# Patient Record
Sex: Male | Born: 1950 | Race: White | Hispanic: No | Smoking: Never smoker
Health system: Southern US, Community
[De-identification: ages and names within clinical notes are randomized; demographics above are authoritative.]

## PROBLEM LIST (undated history)

## (undated) DIAGNOSIS — I5189 Other ill-defined heart diseases: Secondary | ICD-10-CM

## (undated) DIAGNOSIS — D242 Benign neoplasm of left breast: Secondary | ICD-10-CM

## (undated) DIAGNOSIS — R55 Syncope and collapse: Secondary | ICD-10-CM

## (undated) DIAGNOSIS — E119 Type 2 diabetes mellitus without complications: Secondary | ICD-10-CM

## (undated) DIAGNOSIS — I700 Atherosclerosis of aorta: Secondary | ICD-10-CM

## (undated) DIAGNOSIS — I2510 Atherosclerotic heart disease of native coronary artery without angina pectoris: Secondary | ICD-10-CM

## (undated) DIAGNOSIS — Z7982 Long term (current) use of aspirin: Secondary | ICD-10-CM

## (undated) DIAGNOSIS — I209 Angina pectoris, unspecified: Secondary | ICD-10-CM

## (undated) DIAGNOSIS — F419 Anxiety disorder, unspecified: Secondary | ICD-10-CM

## (undated) DIAGNOSIS — M8580 Other specified disorders of bone density and structure, unspecified site: Secondary | ICD-10-CM

## (undated) DIAGNOSIS — A630 Anogenital (venereal) warts: Secondary | ICD-10-CM

## (undated) DIAGNOSIS — H269 Unspecified cataract: Secondary | ICD-10-CM

## (undated) DIAGNOSIS — I10 Essential (primary) hypertension: Secondary | ICD-10-CM

## (undated) DIAGNOSIS — E785 Hyperlipidemia, unspecified: Secondary | ICD-10-CM

## (undated) DIAGNOSIS — Z974 Presence of external hearing-aid: Secondary | ICD-10-CM

## (undated) DIAGNOSIS — Z889 Allergy status to unspecified drugs, medicaments and biological substances status: Secondary | ICD-10-CM

## (undated) DIAGNOSIS — Z973 Presence of spectacles and contact lenses: Secondary | ICD-10-CM

## (undated) DIAGNOSIS — F329 Major depressive disorder, single episode, unspecified: Secondary | ICD-10-CM

## (undated) DIAGNOSIS — T7840XA Allergy, unspecified, initial encounter: Secondary | ICD-10-CM

## (undated) DIAGNOSIS — Z86010 Personal history of colonic polyps: Secondary | ICD-10-CM

## (undated) DIAGNOSIS — G4733 Obstructive sleep apnea (adult) (pediatric): Secondary | ICD-10-CM

## (undated) DIAGNOSIS — G4730 Sleep apnea, unspecified: Secondary | ICD-10-CM

## (undated) DIAGNOSIS — J302 Other seasonal allergic rhinitis: Secondary | ICD-10-CM

## (undated) DIAGNOSIS — Z9989 Dependence on other enabling machines and devices: Secondary | ICD-10-CM

## (undated) DIAGNOSIS — Z860101 Personal history of adenomatous and serrated colon polyps: Secondary | ICD-10-CM

## (undated) DIAGNOSIS — N529 Male erectile dysfunction, unspecified: Secondary | ICD-10-CM

## (undated) DIAGNOSIS — F32A Depression, unspecified: Secondary | ICD-10-CM

## (undated) DIAGNOSIS — Z9289 Personal history of other medical treatment: Secondary | ICD-10-CM

## (undated) HISTORY — PX: CHOLECYSTECTOMY: SHX55

## (undated) HISTORY — PX: APPENDECTOMY: SHX54

## (undated) HISTORY — DX: Depression, unspecified: F32.A

## (undated) HISTORY — PX: COLONOSCOPY WITH PROPOFOL: SHX5780

## (undated) HISTORY — DX: Male erectile dysfunction, unspecified: N52.9

## (undated) HISTORY — DX: Anxiety disorder, unspecified: F41.9

## (undated) HISTORY — DX: Hyperlipidemia, unspecified: E78.5

## (undated) HISTORY — DX: Essential (primary) hypertension: I10

## (undated) HISTORY — PX: CHOLECYSTECTOMY OPEN: SUR202

## (undated) HISTORY — DX: Allergy, unspecified, initial encounter: T78.40XA

## (undated) HISTORY — PX: OTHER SURGICAL HISTORY: SHX169

## (undated) HISTORY — DX: Allergy status to unspecified drugs, medicaments and biological substances status: Z88.9

## (undated) HISTORY — DX: Major depressive disorder, single episode, unspecified: F32.9

## (undated) HISTORY — DX: Sleep apnea, unspecified: G47.30

## (undated) HISTORY — DX: Type 2 diabetes mellitus without complications: E11.9

## (undated) HISTORY — DX: Unspecified cataract: H26.9

## (undated) HISTORY — DX: Obstructive sleep apnea (adult) (pediatric): G47.33

---

## 1998-08-29 ENCOUNTER — Encounter: Payer: Self-pay | Admitting: Emergency Medicine

## 1998-08-29 ENCOUNTER — Emergency Department (HOSPITAL_COMMUNITY): Admission: EM | Admit: 1998-08-29 | Discharge: 1998-08-29 | Payer: Self-pay | Admitting: Emergency Medicine

## 2002-06-09 ENCOUNTER — Encounter: Payer: Self-pay | Admitting: Internal Medicine

## 2002-06-17 ENCOUNTER — Encounter: Payer: Self-pay | Admitting: Internal Medicine

## 2002-06-29 LAB — HM COLONOSCOPY: HM Colonoscopy: NEGATIVE

## 2006-07-10 ENCOUNTER — Encounter: Payer: Self-pay | Admitting: Internal Medicine

## 2007-04-08 HISTORY — PX: CARDIOVASCULAR STRESS TEST: SHX262

## 2007-04-16 ENCOUNTER — Emergency Department: Payer: Self-pay | Admitting: Emergency Medicine

## 2007-04-16 ENCOUNTER — Other Ambulatory Visit: Payer: Self-pay

## 2007-04-17 ENCOUNTER — Ambulatory Visit: Payer: Self-pay | Admitting: Cardiovascular Disease

## 2007-04-17 DIAGNOSIS — Z9289 Personal history of other medical treatment: Secondary | ICD-10-CM

## 2007-04-17 HISTORY — DX: Personal history of other medical treatment: Z92.89

## 2007-07-06 ENCOUNTER — Ambulatory Visit: Payer: Self-pay | Admitting: Internal Medicine

## 2007-07-06 DIAGNOSIS — F39 Unspecified mood [affective] disorder: Secondary | ICD-10-CM | POA: Insufficient documentation

## 2007-07-06 DIAGNOSIS — N529 Male erectile dysfunction, unspecified: Secondary | ICD-10-CM | POA: Insufficient documentation

## 2007-07-06 DIAGNOSIS — I10 Essential (primary) hypertension: Secondary | ICD-10-CM | POA: Insufficient documentation

## 2007-07-09 LAB — CONVERTED CEMR LAB
Albumin: 3.7 g/dL (ref 3.5–5.2)
BUN: 10 mg/dL (ref 6–23)
CO2: 33 meq/L — ABNORMAL HIGH (ref 19–32)
Calcium: 8.8 mg/dL (ref 8.4–10.5)
Chloride: 103 meq/L (ref 96–112)
Creatinine, Ser: 0.9 mg/dL (ref 0.4–1.5)
GFR calc Af Amer: 113 mL/min
GFR calc non Af Amer: 93 mL/min
Glucose, Bld: 86 mg/dL (ref 70–99)
Phosphorus: 3.5 mg/dL (ref 2.3–4.6)
Potassium: 4.1 meq/L (ref 3.5–5.1)
Sodium: 140 meq/L (ref 135–145)

## 2007-08-31 ENCOUNTER — Telehealth (INDEPENDENT_AMBULATORY_CARE_PROVIDER_SITE_OTHER): Payer: Self-pay | Admitting: *Deleted

## 2007-10-26 ENCOUNTER — Ambulatory Visit: Payer: Self-pay | Admitting: Internal Medicine

## 2007-12-04 ENCOUNTER — Ambulatory Visit: Payer: Self-pay | Admitting: Internal Medicine

## 2008-02-04 ENCOUNTER — Telehealth (INDEPENDENT_AMBULATORY_CARE_PROVIDER_SITE_OTHER): Payer: Self-pay | Admitting: *Deleted

## 2008-04-02 ENCOUNTER — Telehealth: Payer: Self-pay | Admitting: Internal Medicine

## 2008-06-03 ENCOUNTER — Telehealth: Payer: Self-pay | Admitting: Family Medicine

## 2008-06-03 ENCOUNTER — Ambulatory Visit: Payer: Self-pay | Admitting: Internal Medicine

## 2008-06-04 ENCOUNTER — Encounter: Payer: Self-pay | Admitting: Internal Medicine

## 2008-06-04 LAB — CONVERTED CEMR LAB
ALT: 47 units/L (ref 0–53)
AST: 26 units/L (ref 0–37)
Albumin: 3.7 g/dL (ref 3.5–5.2)
Alkaline Phosphatase: 63 units/L (ref 39–117)
BUN: 9 mg/dL (ref 6–23)
Basophils Absolute: 0 10*3/uL (ref 0.0–0.1)
Basophils Relative: 0.4 % (ref 0.0–3.0)
Bilirubin, Direct: 0.2 mg/dL (ref 0.0–0.3)
CO2: 31 meq/L (ref 19–32)
Calcium: 8.8 mg/dL (ref 8.4–10.5)
Chloride: 107 meq/L (ref 96–112)
Creatinine, Ser: 0.9 mg/dL (ref 0.4–1.5)
Eosinophils Absolute: 0.1 10*3/uL (ref 0.0–0.7)
Eosinophils Relative: 1.3 % (ref 0.0–5.0)
GFR calc non Af Amer: 92.76 mL/min (ref >60)
Glucose, Bld: 110 mg/dL — ABNORMAL HIGH (ref 70–99)
HCT: 42 % (ref 39.0–52.0)
Hemoglobin: 14.9 g/dL (ref 13.0–17.0)
Lymphocytes Relative: 22 % (ref 12.0–46.0)
Lymphs Abs: 1.2 10*3/uL (ref 0.7–4.0)
MCHC: 35.5 g/dL (ref 30.0–36.0)
MCV: 90.6 fL (ref 78.0–100.0)
Monocytes Absolute: 0.5 10*3/uL (ref 0.1–1.0)
Monocytes Relative: 9 % (ref 3.0–12.0)
Neutro Abs: 3.8 10*3/uL (ref 1.4–7.7)
Neutrophils Relative %: 67.3 % (ref 43.0–77.0)
PSA: 0.65 ng/mL (ref 0.10–4.00)
Platelets: 161 10*3/uL (ref 150.0–400.0)
Potassium: 4.2 meq/L (ref 3.5–5.1)
RBC: 4.63 M/uL (ref 4.22–5.81)
RDW: 12.9 % (ref 11.5–14.6)
Sodium: 143 meq/L (ref 135–145)
TSH: 0.99 microintl units/mL (ref 0.35–5.50)
Total Bilirubin: 1.1 mg/dL (ref 0.3–1.2)
Total Protein: 6.7 g/dL (ref 6.0–8.3)
WBC: 5.6 10*3/uL (ref 4.5–10.5)

## 2008-06-05 LAB — CONVERTED CEMR LAB
HCV Ab: NEGATIVE
Hep A IgM: NEGATIVE
Hep B C IgM: NEGATIVE
Hepatitis B Surface Ag: NEGATIVE

## 2008-08-05 ENCOUNTER — Telehealth: Payer: Self-pay | Admitting: Internal Medicine

## 2008-10-30 ENCOUNTER — Telehealth: Payer: Self-pay | Admitting: Internal Medicine

## 2008-11-03 ENCOUNTER — Telehealth: Payer: Self-pay | Admitting: Internal Medicine

## 2008-12-05 ENCOUNTER — Ambulatory Visit: Payer: Self-pay | Admitting: Internal Medicine

## 2008-12-30 ENCOUNTER — Telehealth (INDEPENDENT_AMBULATORY_CARE_PROVIDER_SITE_OTHER): Payer: Self-pay | Admitting: *Deleted

## 2009-03-03 ENCOUNTER — Telehealth: Payer: Self-pay | Admitting: Internal Medicine

## 2009-05-04 ENCOUNTER — Telehealth: Payer: Self-pay | Admitting: Internal Medicine

## 2009-06-15 ENCOUNTER — Ambulatory Visit: Payer: Self-pay | Admitting: Family Medicine

## 2009-06-15 DIAGNOSIS — J018 Other acute sinusitis: Secondary | ICD-10-CM | POA: Insufficient documentation

## 2009-06-22 ENCOUNTER — Telehealth: Payer: Self-pay | Admitting: Family Medicine

## 2009-07-01 ENCOUNTER — Telehealth: Payer: Self-pay | Admitting: Internal Medicine

## 2009-07-10 ENCOUNTER — Telehealth (INDEPENDENT_AMBULATORY_CARE_PROVIDER_SITE_OTHER): Payer: Self-pay | Admitting: *Deleted

## 2009-09-21 ENCOUNTER — Ambulatory Visit: Payer: Self-pay | Admitting: Internal Medicine

## 2009-09-23 LAB — CONVERTED CEMR LAB
ALT: 47 units/L (ref 0–53)
AST: 30 units/L (ref 0–37)
Albumin: 4.2 g/dL (ref 3.5–5.2)
Alkaline Phosphatase: 53 units/L (ref 39–117)
BUN: 9 mg/dL (ref 6–23)
Basophils Absolute: 0 10*3/uL (ref 0.0–0.1)
Basophils Relative: 0.4 % (ref 0.0–3.0)
Bilirubin, Direct: 0.2 mg/dL (ref 0.0–0.3)
CO2: 33 meq/L — ABNORMAL HIGH (ref 19–32)
Calcium: 8.8 mg/dL (ref 8.4–10.5)
Chloride: 103 meq/L (ref 96–112)
Creatinine, Ser: 0.9 mg/dL (ref 0.4–1.5)
Eosinophils Absolute: 0.1 10*3/uL (ref 0.0–0.7)
Eosinophils Relative: 1 % (ref 0.0–5.0)
GFR calc non Af Amer: 88.9 mL/min (ref >60)
Glucose, Bld: 89 mg/dL (ref 70–99)
HCT: 44.8 % (ref 39.0–52.0)
Hemoglobin: 14.9 g/dL (ref 13.0–17.0)
Lymphocytes Relative: 18.7 % (ref 12.0–46.0)
Lymphs Abs: 1.6 10*3/uL (ref 0.7–4.0)
MCHC: 33.3 g/dL (ref 30.0–36.0)
MCV: 93.1 fL (ref 78.0–100.0)
Monocytes Absolute: 0.7 10*3/uL (ref 0.1–1.0)
Monocytes Relative: 8.3 % (ref 3.0–12.0)
Neutro Abs: 6.1 10*3/uL (ref 1.4–7.7)
Neutrophils Relative %: 71.6 % (ref 43.0–77.0)
PSA: 0.69 ng/mL (ref 0.10–4.00)
Phosphorus: 3.2 mg/dL (ref 2.3–4.6)
Platelets: 202 10*3/uL (ref 150.0–400.0)
Potassium: 3.8 meq/L (ref 3.5–5.1)
RBC: 4.82 M/uL (ref 4.22–5.81)
RDW: 13.8 % (ref 11.5–14.6)
Sodium: 141 meq/L (ref 135–145)
TSH: 0.69 microintl units/mL (ref 0.35–5.50)
Total Bilirubin: 1 mg/dL (ref 0.3–1.2)
Total Protein: 7.2 g/dL (ref 6.0–8.3)
WBC: 8.5 10*3/uL (ref 4.5–10.5)

## 2009-10-02 ENCOUNTER — Telehealth: Payer: Self-pay | Admitting: Family Medicine

## 2009-10-12 ENCOUNTER — Telehealth: Payer: Self-pay | Admitting: Internal Medicine

## 2009-10-14 ENCOUNTER — Telehealth: Payer: Self-pay | Admitting: Internal Medicine

## 2009-10-20 ENCOUNTER — Telehealth: Payer: Self-pay | Admitting: Internal Medicine

## 2009-10-21 ENCOUNTER — Ambulatory Visit: Payer: Self-pay | Admitting: Internal Medicine

## 2009-11-04 ENCOUNTER — Telehealth: Payer: Self-pay | Admitting: Internal Medicine

## 2009-12-17 ENCOUNTER — Ambulatory Visit: Payer: Self-pay | Admitting: Internal Medicine

## 2009-12-21 ENCOUNTER — Telehealth: Payer: Self-pay | Admitting: Internal Medicine

## 2010-01-01 ENCOUNTER — Telehealth: Payer: Self-pay | Admitting: Internal Medicine

## 2010-02-12 ENCOUNTER — Telehealth: Payer: Self-pay | Admitting: Family Medicine

## 2010-04-06 NOTE — Progress Notes (Signed)
Summary: citalopram   Phone Note Call from Patient Call back at Home Phone [phone]   Caller: Patient Call For: Cindee Salt MD Summary of Call: Patient has been taking the citalopram at bedtime instead of in the morning. He says that he still having alot of anxiety in the mornings. He is asking if it is okay to increase it to 2 times a day at this point. Please advise.  Initial call taken by: Melody Comas,  November 04, 2009 9:54 AM  Follow-up for Phone Call        okay to increase to two times a day   had appt today  Please reschedule for about 3 weeks to see how he is doing on the higher dose Follow-up by: Cindee Salt MD,  November 04, 2009 10:27 AM  Additional Follow-up for Phone Call Additional follow up Details #1::        Spoke with patient and advised results. Rx sent to pharmacy, also pt states he will call back to scheduled appt. Additional Follow-up by: Mervin Hack CMA (AAMA),  November 04, 2009 11:04 AM    New/Updated Medications: CITALOPRAM HYDROBROMIDE 20 MG TABS (CITALOPRAM HYDROBROMIDE) take 1 by mouth by mouth two times a day Prescriptions: CITALOPRAM HYDROBROMIDE 20 MG TABS (CITALOPRAM HYDROBROMIDE) take 1 by mouth by mouth two times a day  #60 x 0   Entered by:   Mervin Hack CMA (AAMA)   Authorized by:   Cindee Salt MD   Signed by:   Mervin Hack CMA (AAMA) on 11/04/2009   Method used:   Electronically to        [facility] [phone]* (retail)       [address]       [place], [place]  [address]       Ph: [phone]       Fax: [phone]   RxID:   [id number]

## 2010-04-06 NOTE — Progress Notes (Signed)
Summary: refill request for xanax  Phone Note Refill Request Message from:  Fax from Pharmacy  Refills Requested: Medication #1:  ALPRAZOLAM 0.5 MG TABS 1-2 tabs by mouth three times a day as needed anxiety or insomnia.   Last Refilled: 09/14/2009 [facility] [place] is on your [facility].  Initial call taken by: Lowella Petties CMA,  October 12, 2009 11:11 AM  Follow-up for Phone Call        #60 just done on July 29th Please check with the pharmacy Follow-up by: Cindee Salt MD,  October 12, 2009 5:30 PM  Additional Follow-up for Phone Call Additional follow up Details #1::        Spoke to Toniann Fail at the pharmacy and was informed that they did receive the refill on July 29th and to disregard this request. Additional Follow-up by: Sydell Axon LPN,  October 12, 2009 5:34 PM

## 2010-04-06 NOTE — Progress Notes (Signed)
Summary: refill request for alprazolam  Phone Note Refill Request Message from:  Fax from Pharmacy  Refills Requested: Medication #1:  ALPRAZOLAM 0.5 MG TABS 1 two times a day as needed for nerves   Last Refilled: 06/02/2009 [facility]ed [facility] [place] [facility] your [facility].  Initial call taken by: Lowella Petties CMA,  July 01, 2009 8:45 AM  Follow-up for Phone Call        okay #60 x 1 Follow-up by: Cindee Salt MD,  July 01, 2009 1:44 PM  Additional Follow-up for Phone Call Additional follow up Details #1::        Rx faxed to pharmacy Additional Follow-up by: DeShannon Smith CMA Duncan Dull),  July 01, 2009 2:28 PM    Prescriptions: ALPRAZOLAM 0.5 MG TABS (ALPRAZOLAM) 1 two times a day as needed for nerves  #60 x 1   Entered by:   Mervin Hack CMA (AAMA)   Authorized by:   Cindee Salt MD   Signed by:   Mervin Hack CMA (AAMA) on 07/01/2009   Method used:   Handwritten   RxID:   [id number]

## 2010-04-06 NOTE — Progress Notes (Signed)
Summary: problem with anxiety  Phone Note Call from Patient Call back at Home Phone [phone]   Caller: Patient Summary of Call: Pt states he wakes in the early mornings and feels very anxious.  He then takes a xanax and it takes him till around lunch time to feel  better.  He is asking if there is something that he can take at bedtime that will help with this.  He says he does feel a little better since starting celexa.   I advised  pt that Dr. Alphonsus Sias is on vacation today and all of next week.  [facility] [place]. Initial call taken by: Lowella Petties CMA,  October 02, 2009 3:16 PM  Follow-up for Phone Call        [facility] to take another dose of Xanax at night.  I would not add another antidepressant if he recently started Celexa.  Needs to see Dr. Alphonsus Sias when he returns or one of [place] if he needs to be seen sooner. Ruthe Mannan MD  October 02, 2009 3:21 PM  Patient advised as instructed via telephone, Rx called to pharmacy. Follow-up by: Linde Gillis CMA Duncan Dull),  October 02, 2009 3:32 PM    New/Updated Medications: ALPRAZOLAM 0.5 MG TABS (ALPRAZOLAM) 1-2 tabs by mouth three times a day as needed anxiety or insomnia. Prescriptions: ALPRAZOLAM 0.5 MG TABS (ALPRAZOLAM) 1-2 tabs by mouth three times a day as needed anxiety or insomnia.  #60 x 0   Entered and Authorized by:   Ruthe Mannan MD   Signed by:   Ruthe Mannan MD on 10/02/2009   Method used:   Telephoned to ...       [facility] [phone]* (retail)       [address]       [place], [place]  [address]       Ph: [phone]       Fax: [phone]   RxID:   [id number]

## 2010-04-06 NOTE — Progress Notes (Signed)
Summary: Rx Alprazolam  Phone Note Refill Request Call back at [phone] Message [facility]:  [facility] on May 04, 2009 9:51 AM  Refills Requested: Medication #1:  ALPRAZOLAM 0.5 MG TABS 1 two times a day as needed for nerves   Last Refilled: 04/03/2009 Received faxed refill request, form in your IN box   Method Requested: Fax to Local Pharmacy Initial call taken by: Linde Gillis CMA Duncan Dull),  May 04, 2009 9:52 AM  Follow-up for Phone Call        okay #60 x 1 Follow-up by: Cindee Salt MD,  May 04, 2009 11:52 AM  Additional Follow-up for Phone Call Additional follow up Details #1::        Rx faxed to pharmacy Additional Follow-up by: DeShannon Smith CMA Duncan Dull),  May 04, 2009 2:37 PM    Prescriptions: ALPRAZOLAM 0.5 MG TABS (ALPRAZOLAM) 1 two times a day as needed for nerves  #60 x 1   Entered by:   Mervin Hack CMA (AAMA)   Authorized by:   Cindee Salt MD   Signed by:   Mervin Hack CMA (AAMA) on 05/04/2009   Method used:   Handwritten   RxID:   [id number]

## 2010-04-06 NOTE — Progress Notes (Signed)
Summary: coreg  Phone Note Refill Request Call back at Home Phone [phone] Message from:  Patient on Jul 10, 2009 4:18 PM  Refills Requested: Medication #1:  COREG CR 20 MG  CP24 Take 1 tablet by mouth once a day Patient says that he has contacted pharmacy numerous times and we still have not received request. [facility] [address]   Initial call taken by: Melody Comas,  Jul 10, 2009 4:27 PM  Follow-up for Phone Call        Patient notified [facility] [address]  Follow-up by: Melody Comas,  Jul 10, 2009 4:34 PM    Prescriptions: COREG CR 20 MG  CP24 (CARVEDILOL PHOSPHATE) Take 1 tablet by mouth once a day  #30 Capsule x 2   Entered by:   Linde Gillis CMA (AAMA)   Authorized by:   Cindee Salt MD   Signed by:   Linde Gillis CMA (AAMA) on 07/10/2009   Method used:   Electronically to        [facility] [phone]* (retail)       [address]       [place], [place]  [address]       Ph: [phone]       Fax: [phone]   RxID:   [id number]

## 2010-04-06 NOTE — Assessment & Plan Note (Signed)
Summary: 2:15 F/U ANXIETY/CLE   Vital Signs:  Patient profile:   60 year old male Weight:      238 pounds Temp:     98.3 degrees F oral Pulse rate:   60 / minute Pulse rhythm:   regular BP sitting:   122 / 80  (left arm) Cuff size:   large  Vitals Entered By: Mervin Hack CMA Duncan Dull) (December 17, 2009 2:06 PM) CC: anxiety   History of Present Illness: Was having more anxiety Went up to 20mg  two times a day about 5 weeks ago Feels that he is doing better but still awakens feeling anxious---lasts about 1 hour unitl his AM meds kick Takes the buspirone regularly two times a day-- ~8AM and 10 PM uses alprazolam  every night sleeping well now  no depression  Allergies: 1)  ! Penicillin  Past History:  Past medical, surgical, family and social histories (including risk factors) reviewed for relevance to current acute and chronic problems.  Past Medical History: Reviewed history from 07/06/2007 and no changes required. Hypertension Erectile dysfunction Anxiety  Past Surgical History: Reviewed history from 07/06/2007 and no changes required. Appendectomy-with chole Cholecystectomy--done by Dr Okey Dupre in [phone] Treadmill stress test negative 2/09  Family History: Reviewed history from 07/06/2007 and no changes required. Dad died @67  of Parkinsons, did have MI Mom with DM, has pacer, arthritis, HTN 1 brother, 2 sisters No DM No colon or prostate cancer  Social History: Reviewed history from 07/06/2007 and no changes required. 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  Hopes to start with [employer]  Review of Systems       appetite is fine weight down 4# Gets a warm sensation after taking buspirone---no sedation though  Physical Exam  General:  alert and normal appearance.   Psych:  normally interactive, good eye contact, not anxious appearing, and not  depressed appearing.     Impression & Recommendations:  Problem # 1:  ANXIETY (ICD-300.00) Assessment Improved overall better but having AM anxiety that suggests a withdrawl problem  will have him stop the alprazolam at night if AM anxiety better, but not sleeping, will add temazepam  His updated medication list for this problem includes:    Alprazolam 0.5 Mg Tabs (Alprazolam) .Marland Kitchen... 1-2 tabs by mouth three times a day as needed for anxiety    Buspirone Hcl 15 Mg Tabs (Buspirone hcl) .Marland Kitchen... 1 tab by mouth two times a day for anxiety    Citalopram Hydrobromide 20 Mg Tabs (Citalopram hydrobromide) .Marland Kitchen... Take 1 by mouth by mouth two times a day  Complete Medication List: 1)  Coreg Cr 20 Mg Cp24 (Carvedilol phosphate) .... Take 1 tablet by mouth once a day 2)  Azor 10-40 Mg Tabs (Amlodipine-olmesartan) .... Take 1 tablet by mouth once a day 3)  Quinapril Hcl 40 Mg Tabs (Quinapril hcl) .... Take 1 tablet by mouth once a day 4)  Cialis 20 Mg Tabs (Tadalafil) .... 1/2 - 1 tab about 1 hour before sex 5)  Alprazolam 0.5 Mg Tabs (Alprazolam) .Marland Kitchen.. 1-2 tabs by mouth three times a day as needed for anxiety 6)  Buspirone Hcl 15 Mg Tabs (Buspirone hcl) .Marland Kitchen.. 1 tab by mouth two times a day for anxiety 7)  Citalopram Hydrobromide 20 Mg Tabs (Citalopram hydrobromide) .... Take 1 by mouth by mouth two times a day 8)  Adult Aspirin Low Strength 81 Mg Tbdp (Aspirin) .... Take one  tablet by mouth every other day  Patient Instructions: 1)  Please stop taking the alprazolam at night but continue the other meds.  2)  Only take the alprazolam for worsening of your anxiety 3)  If you continue to have sleep problems, please call and we will try a different medicaiton 4)  Please schedule a follow-up appointment in 4 months .   Current Allergies (reviewed today): ! PENICILLIN

## 2010-04-06 NOTE — Assessment & Plan Note (Signed)
Summary: 9:45 discuss med for anxiety/alc   Vital Signs:  Patient profile:   60 year old male Weight:      242 pounds Temp:     98.1 degrees F oral Pulse rate:   74 / minute Pulse rhythm:   regular BP sitting:   118 / 60  (left arm) Cuff size:   large  Vitals Entered By: Mervin Hack CMA Duncan Dull) (October 21, 2009 10:05 AM) CC: anxiety   History of Present Illness: Has had more trouble with his anxiety did start taking the buspirone regularly two times a day since the phone call Biggest problem has been early morning awakening like 4AM and can't get back to sleep will try alprazolam but that may affect his AM alertness  This goes back for some time anxiety is okay during the day in general but seems like his meds wear off by early AM Buspar dose is 7AM and 8-9PM takes citalopram in early AM with other meds  Prior to 8/10, he was only taking 7.5mg  two times a day of buspirone  Allergies: 1)  ! Penicillin  Past History:  Past medical, surgical, family and social histories (including risk factors) reviewed for relevance to current acute and chronic problems.  Past Medical History: Reviewed history from 07/06/2007 and no changes required. Hypertension Erectile dysfunction Anxiety  Past Surgical History: Reviewed history from 07/06/2007 and no changes required. Appendectomy-with chole Cholecystectomy--done by Dr Okey Dupre in [phone] Treadmill stress test negative 2/09  Family History: Reviewed history from 07/06/2007 and no changes required. Dad died @67  of Parkinsons, did have MI Mom with DM, has pacer, arthritis, HTN 1 brother, 2 sisters No DM No colon or prostate cancer  Social History: Reviewed history from 07/06/2007 and no changes required. 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  Hopes to start with [employer]  Review of Systems       weight down 11#  since last visit trying to walk more appetite may be down some with increased buspar  Physical Exam  Psych:  normally interactive, good eye contact, not anxious appearing, and not depressed appearing.     Impression & Recommendations:  Problem # 1:  ANXIETY (ICD-300.00) Assessment Deteriorated has early morning anxiety otherwise well controlled will wait to see if increased buspirone helps consider changing citalopram to bedtime. If persists, would increase to 20mg  twice daily 15 minute visit--all counselling  His updated medication list for this problem includes:    Alprazolam 0.5 Mg Tabs (Alprazolam) .Marland Kitchen... 1-2 tabs by mouth three times a day as needed anxiety or insomnia.    Buspirone Hcl 15 Mg Tabs (Buspirone hcl) .Marland Kitchen... 1 tab by mouth two times a day for anxiety    Citalopram Hydrobromide 20 Mg Tabs (Citalopram hydrobromide) .Marland Kitchen... 1 tab in evening  for anxiety and depression  Complete Medication List: 1)  Coreg Cr 20 Mg Cp24 (Carvedilol phosphate) .... Take 1 tablet by mouth once a day 2)  Azor 10-40 Mg Tabs (Amlodipine-olmesartan) .... Take 1 tablet by mouth once a day 3)  Quinapril Hcl 40 Mg Tabs (Quinapril hcl) .... Take 1 tablet by mouth once a day 4)  Cialis 20 Mg Tabs (Tadalafil) .... 1/2 - 1 tab about 1 hour before sex 5)  Alprazolam 0.5 Mg Tabs (Alprazolam) .Marland Kitchen.. 1-2 tabs by mouth three times a day as needed anxiety or insomnia. 6)  Buspirone Hcl 15 Mg Tabs (Buspirone hcl) .Marland KitchenMarland KitchenMarland Kitchen 1  tab by mouth two times a day for anxiety 7)  Citalopram Hydrobromide 20 Mg Tabs (Citalopram hydrobromide) .Marland Kitchen.. 1 tab in evening  for anxiety and depression 8)  Adult Aspirin Low Strength 81 Mg Tbdp (Aspirin) .... Take one tablet by mouth every other day  Patient Instructions: 1)  Please try the citalopram at bedtime instead of the morning. If the anxiety is not better, we will increase it to 20mg  two times a day  2)  Please keep your regular appointment  Current Allergies (reviewed today): !  PENICILLIN

## 2010-04-06 NOTE — Assessment & Plan Note (Signed)
Summary: ?EAR,SINUS INFECTION/CLE   Vital Signs:  Patient profile:   60 year old male Height:      69 inches Weight:      253.25 pounds BMI:     37.53 Temp:     98.9 degrees F oral Pulse rate:   84 / minute Pulse rhythm:   regular BP sitting:   126 / 80  (left arm) Cuff size:   large  Vitals Entered By: Delilah Shan CMA (AAMA) (June 15, 2009 10:00 AM) CC: ? ear / sinus infection   History of Present Illness: 60 yo with ear and sinus pressure.  Started out with a scratchy throat last week, last few days sinus pressure and drainage so severe he could not sleep.  Dry cough, ears popping. No fevers, chills, wheezing, or SOB.  Current Medications (verified): 1)  Coreg Cr 20 Mg  Cp24 (Carvedilol Phosphate) .... Take 1 Tablet By Mouth Once A Day 2)  Azor 10-40 Mg  Tabs (Amlodipine-Olmesartan) .... Take 1 Tablet By Mouth Once A Day 3)  Quinapril Hcl 40 Mg  Tabs (Quinapril Hcl) .... Take 1 Tablet By Mouth Once A Day 4)  Adult Aspirin Low Strength 81 Mg  Tbdp (Aspirin) .... Take One Tablet By Mouth Every Other Day 5)  Cialis 20 Mg  Tabs (Tadalafil) .... 1/2 - 1 Tab About 1 Hour Before Sex 6)  Alprazolam 0.5 Mg Tabs (Alprazolam) .Marland Kitchen.. 1 Two Times A Day As Needed For Nerves 7)  Buspirone Hcl 15 Mg Tabs (Buspirone Hcl) .Marland Kitchen.. 1 Tab By Mouth Two Times A Day For Anxiety 8)  Azithromycin 250 Mg  Tabs (Azithromycin) .... 2 By  Mouth Today and Then 1 Daily For 4 Days  Allergies: 1)  ! Penicillin  Review of Systems      See HPI General:  Denies chills and fever. ENT:  Complains of nasal congestion, postnasal drainage, sinus pressure, and sore throat. CV:  Denies shortness of breath with exertion. Resp:  Complains of cough; denies shortness of breath, sputum productive, and wheezing.  Physical Exam  General:  alert and normal appearance.   Non toxic, afebrile and normotensive Ears:  TMs retracted bialterally Nose:  nasal dischargemucosal pallor.   maxillary sinuses TTP Mouth:  no  erythema and no lesions.   Lungs:  normal respiratory effort and normal breath sounds.   Heart:  normal rate, regular rhythm, no murmur, and no gallop.   Extremities:  no edema Cervical Nodes:  No lymphadenopathy noted Psych:  normally interactive, good eye contact, not anxious appearing, and not depressed appearing.     Impression & Recommendations:  Problem # 1:  OTHER ACUTE SINUSITIS (ICD-461.8) Assessment New Given duration and progression of symptoms, will treat for bacterial sinusitis with Zpack (has PCN allergy). Supportive care as per pt instructions. His updated medication list for this problem includes:    Azithromycin 250 Mg Tabs (Azithromycin) .Marland Kitchen... 2 by  mouth today and then 1 daily for 4 days  Complete Medication List: 1)  Coreg Cr 20 Mg Cp24 (Carvedilol phosphate) .... Take 1 tablet by mouth once a day 2)  Azor 10-40 Mg Tabs (Amlodipine-olmesartan) .... Take 1 tablet by mouth once a day 3)  Quinapril Hcl 40 Mg Tabs (Quinapril hcl) .... Take 1 tablet by mouth once a day 4)  Adult Aspirin Low Strength 81 Mg Tbdp (Aspirin) .... Take one tablet by mouth every other day 5)  Cialis 20 Mg Tabs (Tadalafil) .... 1/2 - 1 tab about 1 hour  before sex 6)  Alprazolam 0.5 Mg Tabs (Alprazolam) .Marland Kitchen.. 1 two times a day as needed for nerves 7)  Buspirone Hcl 15 Mg Tabs (Buspirone hcl) .Marland Kitchen.. 1 tab by mouth two times a day for anxiety 8)  Azithromycin 250 Mg Tabs (Azithromycin) .... 2 by  mouth today and then 1 daily for 4 days  Patient Instructions: 1)  Take Zpack as directed.  Drink lots of fluids.  Treat sympotmatically with Mucinex, nasal saline irrigation, and Tylenol/Ibuprofen.  You can use warm compresses.  Cough suppressant at night. Call if not improving as expected in 5-7 days.  Prescriptions: AZITHROMYCIN 250 MG  TABS (AZITHROMYCIN) 2 by  mouth today and then 1 daily for 4 days  #6 x 0   Entered and Authorized by:   Ruthe Mannan MD   Signed by:   Ruthe Mannan MD on 06/15/2009   Method  used:   Electronically to        [facility] [phone][facility] (retail)       [address]       [place], [place]  [address]       Ph: [phone]       Fax: [phone]   RxID:   [id number]   Current Allergies (reviewed today): ! PENICILLIN

## 2010-04-06 NOTE — Progress Notes (Signed)
Summary: refill request for temazepam  Phone Note Refill Request Message from:  Fax from Pharmacy  Refills Requested: Medication #1:  TEMAZEPAM 15 MG CAPS 1-2 at bedtime as needed to help sleep.   Last Refilled: 01/01/2010 [facility], [phone].  Initial call taken by: Lowella Petties CMA, AAMA,  February 12, 2010 4:13 PM  Follow-up for Phone Call        filled.  please call in and notify patient. Follow-up by: Eustaquio Boyden  MD,  February 12, 2010 4:47 PM  Additional Follow-up for Phone Call Additional follow up Details #1::        [facility] Additional Follow-up by: Lowella Petties CMA, AAMA,  February 12, 2010 5:02 PM    Prescriptions: TEMAZEPAM 15 MG CAPS (TEMAZEPAM) 1-2 at bedtime as needed to help sleep  #60 x 0   Entered and Authorized by:   Eustaquio Boyden  MD   Signed by:   Lowella Petties CMA, AAMA on 02/12/2010   Method used:   Telephoned to ...       [facility] [phone]* (retail)       [address]       [place], [place]  [address]       Ph: [phone]       Fax: [phone]   RxID:   [id number]

## 2010-04-06 NOTE — Progress Notes (Signed)
Summary: refill request for alprazolam  Phone Note Refill Request Message from:  Fax from Pharmacy  Refills Requested: Medication #1:  ALPRAZOLAM 0.5 MG TABS 1-2 tabs by mouth three times a day as needed anxiety or insomnia.   Last Refilled: 09/14/2009 [facility]ed [facility] [place], [phone].  Initial call taken by: Lowella Petties CMA,  October 20, 2009 11:43 AM  Follow-up for Phone Call        okay #60 x 0 Follow-up by: Cindee Salt MD,  October 20, 2009 1:26 PM  Additional Follow-up for Phone Call Additional follow up Details #1::        Rx called to pharmacy Additional Follow-up by: DeShannon Katrinka Blazing CMA Duncan Dull),  October 20, 2009 2:22 PM    Prescriptions: ALPRAZOLAM 0.5 MG TABS (ALPRAZOLAM) 1-2 tabs by mouth three times a day as needed anxiety or insomnia.  #60 x 0   Entered by:   Mervin Hack CMA (AAMA)   Authorized by:   Cindee Salt MD   Signed by:   Mervin Hack CMA (AAMA) on 10/20/2009   Method used:   Telephoned to ...       [facility] [phone]* (retail)       [address]       [place], [place]  [address]       Ph: [phone]       Fax: [phone]   RxID:   [id number]

## 2010-04-06 NOTE — Assessment & Plan Note (Signed)
Summary: CPX/DLO   Vital Signs:  Patient profile:   60 year old male Height:      68 inches Weight:      253 pounds Temp:     98.4 degrees F oral Pulse rate:   80 / minute Pulse rhythm:   regular BP sitting:   132 / 82  (left arm) Cuff size:   large  Vitals Entered By: Lowella Petties CMA (September 21, 2009 3:09 PM) CC: CPX   History of Present Illness: Fighting bad depression Business has been okay but being sued by former Airline pilot who they had to fire Dealing with this stress Going to court later this week Tax audit also ongoing Constant anxiety and stress mode gets jittery, can't sleep ---occ wakes in sweat and panic symptoms Has been on buspirone and alprazolam Stress and depression are situational but quite significant  No other medical concerns  Allergies: 1)  ! Penicillin  Past History:  Past medical, surgical, family and social histories (including risk factors) reviewed for relevance to current acute and chronic problems.  Past Medical History: Reviewed history from 07/06/2007 and no changes required. Hypertension Erectile dysfunction Anxiety  Past Surgical History: Reviewed history from 07/06/2007 and no changes required. Appendectomy-with chole Cholecystectomy--done by Dr Okey Dupre in [phone] Treadmill stress test negative 2/09  Family History: Reviewed history from 07/06/2007 and no changes required. Dad died @67  of Parkinsons, did have MI Mom with DM, has pacer, arthritis, HTN 1 brother, 2 sisters No DM No colon or prostate cancer  Social History: Reviewed history from 07/06/2007 and no changes required. 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  Hopes to start with [employer]  Review of Systems General:  has gained his 10# back wears seat belt. Eyes:  Denies double vision and vision loss-1 eye. ENT:  Complains of decreased hearing; denies  ringing in ears; wears hearing aides recent dental work--extraction and implant. CV:  Complains of swelling of feet; denies chest pain or discomfort, difficulty breathing at night, difficulty breathing while lying down, fainting, lightheadness, palpitations, and shortness of breath with exertion; mild ankle edema in evenings. Resp:  Denies cough and shortness of breath. GI:  Denies abdominal pain, bloody stools, change in bowel habits, dark tarry stools, indigestion, nausea, and vomiting. GU:  Complains of erectile dysfunction; denies urinary frequency and urinary hesitancy. MS:  Denies joint pain and joint swelling. Derm:  Denies lesion(s) and rash. Neuro:  Complains of numbness and tingling; denies headaches and weakness; some left arm numbness and tingling--related to stress. Psych:  Complains of anxiety and depression. Heme:  Denies abnormal bruising and enlarge lymph nodes. Allergy:  did have allergic reaction after spreading pine needles--resolved with a few days of predniosne.  Physical Exam  General:  alert and normal appearance.   Eyes:  pupils equal, pupils round, pupils reactive to light, and no optic disk abnormalities.   Ears:  R ear normal and L ear normal.   Mouth:  no erythema, no exudates, and no lesions.   Neck:  supple, no masses, no thyromegaly, no carotid bruits, and no cervical lymphadenopathy.   Lungs:  normal respiratory effort and normal breath sounds.   Heart:  normal rate, regular rhythm, no murmur, and no gallop.   Abdomen:  soft, non-tender, and no masses.   Rectal:  no external abnormalities, no hemorrhoids, and no masses.   Prostate:  no gland enlargement and no nodules.  Msk:  no joint tenderness and no joint swelling.   Pulses:  1+ in feet Extremities:  no edema Neurologic:  alert & oriented X3, strength normal in all extremities, and gait normal.   Skin:  no rashes and no suspicious lesions.   Axillary Nodes:  No palpable lymphadenopathy Psych:   normally interactive, good eye contact, dysphoric affect, and slightly anxious.     Impression & Recommendations:  Problem # 1:  PREVENTIVE HEALTH CARE (ICD-V70.0) Assessment Comment Only colon up to date due for PSA discussed fitness  Problem # 2:  ANXIETY (ICD-300.00) Assessment: Deteriorated lots of stress now with secondary depression DIscussed at length will add citalopram discussed potential side effects including irritablility and suicidal ideation  His updated medication list for this problem includes:    Alprazolam 0.5 Mg Tabs (Alprazolam) .Marland Kitchen... 1 two times a day as needed for nerves    Buspirone Hcl 15 Mg Tabs (Buspirone hcl) .Marland Kitchen... 1 tab by mouth two times a day for anxiety    Citalopram Hydrobromide 20 Mg Tabs (Citalopram hydrobromide) .Marland Kitchen... 1 tab daily for anxiety and depression  Problem # 3:  HYPERTENSION (ICD-401.9) Assessment: Unchanged  reasonable control  no changes needed  His updated medication list for this problem includes:    Coreg Cr 20 Mg Cp24 (Carvedilol phosphate) .Marland Kitchen... Take 1 tablet by mouth once a day    Azor 10-40 Mg Tabs (Amlodipine-olmesartan) .Marland Kitchen... Take 1 tablet by mouth once a day    Quinapril Hcl 40 Mg Tabs (Quinapril hcl) .Marland Kitchen... Take 1 tablet by mouth once a day  BP today: 132/82 Prior BP: 126/80 (06/15/2009)  Labs Reviewed: K+: 4.2 (06/03/2008) Creat: : 0.9 (06/03/2008)     Orders: TLB-Renal Function Panel (80069-RENAL) TLB-CBC Platelet - w/Differential (85025-CBCD) TLB-Hepatic/Liver Function Pnl (80076-HEPATIC) TLB-TSH (Thyroid Stimulating Hormone) (84443-TSH) Venipuncture (16109)  Complete Medication List: 1)  Coreg Cr 20 Mg Cp24 (Carvedilol phosphate) .... Take 1 tablet by mouth once a day 2)  Azor 10-40 Mg Tabs (Amlodipine-olmesartan) .... Take 1 tablet by mouth once a day 3)  Quinapril Hcl 40 Mg Tabs (Quinapril hcl) .... Take 1 tablet by mouth once a day 4)  Adult Aspirin Low Strength 81 Mg Tbdp (Aspirin) .... Take one  tablet by mouth every other day 5)  Cialis 20 Mg Tabs (Tadalafil) .... 1/2 - 1 tab about 1 hour before sex 6)  Alprazolam 0.5 Mg Tabs (Alprazolam) .Marland Kitchen.. 1 two times a day as needed for nerves 7)  Buspirone Hcl 15 Mg Tabs (Buspirone hcl) .Marland Kitchen.. 1 tab by mouth two times a day for anxiety 8)  Citalopram Hydrobromide 20 Mg Tabs (Citalopram hydrobromide) .Marland Kitchen.. 1 tab daily for anxiety and depression  Other Orders: TLB-PSA (Prostate Specific Antigen) (84153-PSA)  Patient Instructions: 1)  Please schedule a follow-up appointment in 4-6 weeks.  Prescriptions: CITALOPRAM HYDROBROMIDE 20 MG TABS (CITALOPRAM HYDROBROMIDE) 1 tab daily for anxiety and depression  #30 x 11   Entered and Authorized by:   Cindee Salt MD   Signed by:   Cindee Salt MD on 09/21/2009   Method used:   [facility]        [facility]  [address] [phone]* (retail)       [address]       [place], [place]  [address]       Ph: [phone]       Fax: [phone]   RxID:   [id number] CIALIS 20 MG  TABS (TADALAFIL) 1/2 - 1 tab about 1 hour before sex  #6 x 3   Entered and Authorized by:   Cindee Salt MD   Signed by:   Cindee Salt MD on 09/21/2009   Method used:   [facility]ically [facility]        [facility]  [address] [phone]* (retail)       [address]       [place], [place]  [address]       Ph: [phone]       Fax: [phone]   RxID:   [id number]   Prior Medications (reviewed today): COREG CR 20 MG  CP24 (CARVEDILOL PHOSPHATE) Take 1 tablet by mouth once a day AZOR 10-40 MG  TABS (AMLODIPINE-OLMESARTAN) Take 1 tablet by mouth once a day QUINAPRIL HCL 40 MG  TABS (QUINAPRIL HCL) Take 1 tablet by mouth once a day ADULT ASPIRIN LOW STRENGTH 81 MG  TBDP (ASPIRIN) Take one tablet by mouth every other day ALPRAZOLAM 0.5 MG TABS (ALPRAZOLAM) 1 two times a day as needed for nerves BUSPIRONE HCL 15 MG TABS (BUSPIRONE HCL) 1 tab by mouth two times a day for anxiety Current Allergies: !  PENICILLIN

## 2010-04-06 NOTE — Progress Notes (Signed)
Summary: refill request for alprazolam  Phone Note Refill Request Message from:  Fax from Pharmacy  Refills Requested: Medication #1:  ALPRAZOLAM 0.5 MG TABS 1-2 tabs by mouth three times a day as needed for anxiety   Last Refilled: 10/20/2009 [facility] [place] [facility] your [facility].  Initial call taken by: Lowella Petties CMA,  December 21, 2009 9:57 AM  Follow-up for Phone Call        okay #60 x 1 Follow-up by: Cindee Salt MD,  December 21, 2009 10:18 AM  Additional Follow-up for Phone Call Additional follow up Details #1::        Rx called to pharmacy Additional Follow-up by: DeShannon Smith CMA Duncan Dull),  December 21, 2009 12:49 PM    Prescriptions: ALPRAZOLAM 0.5 MG TABS (ALPRAZOLAM) 1-2 tabs by mouth three times a day as needed for anxiety  #60 x 1   Entered by:   Mervin Hack CMA (AAMA)   Authorized by:   Cindee Salt MD   Signed by:   Mervin Hack CMA (AAMA) on 12/21/2009   Method used:   Telephoned to ...       [facility] [phone]* (retail)       [address]       [place], [place]  [address]       Ph: [phone]       Fax: [phone]   RxID:   [id number]

## 2010-04-06 NOTE — Progress Notes (Signed)
Summary: still having problems with anxiety  Phone Note Call from Patient Call back at Home Phone [phone]   Caller: Patient Call For: Cindee Salt MD Summary of Call: Pt is taking celexa in the morning, takes xanax and buspar as needed.  Taking about 4 xanax a day and about 1-2 buspar a day.  He is asking if he should take these on a schedule, instead of one half now and maybe a whole one later.  He is having periods of extreme anxiety during the day, cant get the anxiety to level off.  Please advise. Initial call taken by: Lowella Petties CMA,  October 14, 2009 3:25 PM  Follow-up for Phone Call        the buspirone should be taken regularly two times a day. This is not supposed to vary!!!! If he needs the xanax regularly, it is okay to take them on a schedule If he is having ongoing problems, he should schedule an appt to review the medications Follow-up by: Cindee Salt MD,  October 14, 2009 7:16 PM  Additional Follow-up for Phone Call Additional follow up Details #1::        patient states he will try a schedule to see how that works and if it doesn't then he will call for an appt. DeShannon Katrinka Blazing CMA Duncan Dull)  October 15, 2009 12:24 PM   Okay Additional Follow-up by: Cindee Salt MD,  October 15, 2009 1:02 PM

## 2010-04-06 NOTE — Progress Notes (Signed)
Summary: wants something for cough  Phone Note Call from Patient Call back at Home Phone [phone]   Caller: Patient Call For: Dr. Dayton Martes Summary of Call: Pt was given z-pack last week for URI.  He has finished that but still has a lot of congestion and cough.  He is asking if something for that [facility] [place].  He has been taking mucinix, tried robitussin but that didnt help.  Advised pt to drink plenty of fluids and told him that the cough can linger for awhile. Initial call taken by: Lowella Petties CMA,  June 22, 2009 9:23 AM    New/Updated Medications: TESSALON PERLES 100 MG  CAPS (BENZONATATE) 1 tab by mouth three times a day as needed cough Prescriptions: TESSALON PERLES 100 MG  CAPS (BENZONATATE) 1 tab by mouth three times a day as needed cough  #20 x 0   Entered and Authorized by:   Ruthe Mannan MD   Signed by:   Ruthe Mannan MD on 06/22/2009   Method used:   Electronically to        [facility] [phone]* (retail)       [address]       [place], [place]  [address]       Ph: [phone]       Fax: [phone]   RxID:   [id number]   Appended Document: wants something for cough Patient notified as instructed by telephone. Med was [facility] electronically to [facility].

## 2010-04-06 NOTE — Progress Notes (Signed)
Summary: not sleeping  Phone Note Call from Patient Call back at Home Phone [phone]   Caller: Patient Call For: Cindee Salt MD Summary of Call: Pt was told to stop xanax at night for a couple of weeks to see how he did.  He is not doing well, not sleeping.  He is asking if you can call him in something to help him sleep.  [facility] [place] Initial call taken by: Lowella Petties CMA, AAMA,  January 01, 2010 9:46 AM  Follow-up for Phone Call        Please send Rx for temazepam 15mg   1-2 at bedtime as needed to help sleep #60 x 0 Follow-up by: Cindee Salt MD,  January 01, 2010 10:53 AM  Additional Follow-up for Phone Call Additional follow up Details #1::        Rx Called In, Spoke with patient and advised results.  Additional Follow-up by: Mervin Hack CMA Duncan Dull),  January 01, 2010 12:25 PM    New/Updated Medications: TEMAZEPAM 15 MG CAPS (TEMAZEPAM) 1-2 at bedtime as needed to help sleep Prescriptions: TEMAZEPAM 15 MG CAPS (TEMAZEPAM) 1-2 at bedtime as needed to help sleep  #60 x 0   Entered by:   Mervin Hack CMA (AAMA)   Authorized by:   Cindee Salt MD   Signed by:   Mervin Hack CMA (AAMA) on 01/01/2010   Method used:   Telephoned to ...       [facility] [phone]* (retail)       [address]       [place], [place]  [address]       Ph: [phone]       Fax: [phone]   RxID:   [id number]

## 2010-04-08 ENCOUNTER — Telehealth: Payer: Self-pay | Admitting: Internal Medicine

## 2010-04-14 NOTE — Progress Notes (Signed)
Summary: requests cialis  Phone Note Call from Patient Call back at Home Phone [phone]   Caller: Patient Summary of Call: Pt has been taking viagra but that causes headaches.  He is asking if he can try cialis, which is what he used to take and it worked well for him. Cant remember the dose but knows it was not the every day dosing.   [facility] [place] Initial call taken by: Lowella Petties CMA, AAMA,  April 08, 2010 4:18 PM  Follow-up for Phone Call        okay to send cialis 20mg  1/2 - 1 tab about 1 hour or more before sex  #6 x 3 Follow-up by: Cindee Salt MD,  April 08, 2010 4:25 PM  Additional Follow-up for Phone Call Additional follow up Details #1::        Rx Called In, left message on machine that rx was called in Additional Follow-up by: Mervin Hack CMA Duncan Dull),  April 08, 2010 4:35 PM    New/Updated Medications: CIALIS 20 MG TABS (TADALAFIL) 1/2 - 1 tab about 1 hour or more before sex Prescriptions: CIALIS 20 MG TABS (TADALAFIL) 1/2 - 1 tab about 1 hour or more before sex  #6 x 3   Entered by:   Mervin Hack CMA (AAMA)   Authorized by:   Cindee Salt MD   Signed by:   Mervin Hack CMA (AAMA) on 04/08/2010   Method used:   Electronically to        [facility] [phone]* (retail)       [address]       [place], [place]  [address]       Ph: [phone]       Fax: [phone]   RxID:   [id number]

## 2010-04-20 ENCOUNTER — Telehealth: Payer: Self-pay | Admitting: Internal Medicine

## 2010-04-23 ENCOUNTER — Ambulatory Visit: Payer: Self-pay | Admitting: Internal Medicine

## 2010-04-28 NOTE — Progress Notes (Signed)
Summary: refill request for temazepam  Phone Note Refill Request Message from:  Fax from Pharmacy  Refills Requested: Medication #1:  TEMAZEPAM 15 MG CAPS 1-2 at bedtime as needed to help sleep   Last Refilled: 02/12/2010 [facility]ed [facility] [place] [facility] on your [facility].  Initial call taken by: Lowella Petties CMA, AAMA,  April 20, 2010 9:37 AM  Follow-up for Phone Call        okay #60 x 1 Follow-up by: Cindee Salt MD,  April 20, 2010 1:29 PM  Additional Follow-up for Phone Call Additional follow up Details #1::        Rx faxed to pharmacy Additional Follow-up by: DeShannon Smith CMA Duncan Dull),  April 20, 2010 2:43 PM    Prescriptions: TEMAZEPAM 15 MG CAPS (TEMAZEPAM) 1-2 at bedtime as needed to help sleep  #60 x 1   Entered by:   Mervin Hack CMA (AAMA)   Authorized by:   Cindee Salt MD   Signed by:   Mervin Hack CMA (AAMA) on 04/20/2010   Method used:   Handwritten   RxID:   [id number]

## 2010-05-10 ENCOUNTER — Telehealth: Payer: Self-pay | Admitting: Family Medicine

## 2010-05-18 NOTE — Progress Notes (Signed)
Summary: alprazolam / citalopram  Phone Note Refill Request Message from:  Patient on May 10, 2010 8:19 AM  Refills Requested: Medication #1:  ALPRAZOLAM 0.5 MG TABS 1-2 tabs by mouth three times a day as needed for anxiety  Medication #2:  CITALOPRAM HYDROBROMIDE 20 MG TABS take 1 by mouth by mouth two times a day [facility] Dr. Alphonsus Sias will be out of the office until next week.   Initial call taken by: Melody Comas,  May 10, 2010 8:20 AM  Follow-up for Phone Call        Rx called to pharmacy Follow-up by: Sydell Axon LPN,  May 10, 2010 9:53 AM    Prescriptions: CITALOPRAM HYDROBROMIDE 20 MG TABS (CITALOPRAM HYDROBROMIDE) take 1 by mouth by mouth two times a day  #60 Tablet x 0   Entered and Authorized by:   Ruthe Mannan MD   Signed by:   Ruthe Mannan MD on 05/10/2010   Method used:   Telephoned to ...       [facility] [phone]* (retail)       [address]       [place], [place]  [address]       Ph: [phone]       Fax: [phone]   RxID:   [id number] ALPRAZOLAM 0.5 MG TABS (ALPRAZOLAM) 1-2 tabs by mouth three times a day as needed for anxiety  #60 x 1   Entered and Authorized by:   Ruthe Mannan MD   Signed by:   Ruthe Mannan MD on 05/10/2010   Method used:   Telephoned to ...       [facility] [phone]* (retail)       [address]       [place], [place]  [address]       Ph: [phone]       Fax: [phone]   RxID:   [id number]

## 2010-05-26 ENCOUNTER — Ambulatory Visit: Payer: Self-pay | Admitting: Internal Medicine

## 2010-06-17 ENCOUNTER — Other Ambulatory Visit: Payer: Self-pay | Admitting: Internal Medicine

## 2010-06-24 ENCOUNTER — Encounter: Payer: Self-pay | Admitting: Internal Medicine

## 2010-06-28 ENCOUNTER — Ambulatory Visit: Payer: Self-pay | Admitting: Internal Medicine

## 2010-07-06 ENCOUNTER — Encounter: Payer: Self-pay | Admitting: Internal Medicine

## 2010-07-06 ENCOUNTER — Ambulatory Visit (INDEPENDENT_AMBULATORY_CARE_PROVIDER_SITE_OTHER): Payer: BC Managed Care – PPO | Admitting: Internal Medicine

## 2010-07-06 VITALS — BP 130/80 | HR 69 | Temp 98.5°F | Ht 68.0 in | Wt 255.0 lb

## 2010-07-06 DIAGNOSIS — I10 Essential (primary) hypertension: Secondary | ICD-10-CM

## 2010-07-06 DIAGNOSIS — G479 Sleep disorder, unspecified: Secondary | ICD-10-CM

## 2010-07-06 DIAGNOSIS — F411 Generalized anxiety disorder: Secondary | ICD-10-CM

## 2010-07-06 MED ORDER — TEMAZEPAM 15 MG PO CAPS: ORAL_CAPSULE | ORAL | Status: DC

## 2010-07-06 MED ORDER — TADALAFIL 20 MG PO TABS: ORAL_TABLET | ORAL | Status: DC

## 2010-07-06 MED ORDER — AMLODIPINE-OLMESARTAN 10-40 MG PO TABS: 1.0000 | ORAL_TABLET | Freq: Every day | ORAL | Status: DC

## 2010-07-06 MED ORDER — QUINAPRIL HCL 40 MG PO TABS: 40.0000 mg | ORAL_TABLET | Freq: Every day | ORAL | Status: DC

## 2010-07-06 MED ORDER — CARVEDILOL PHOSPHATE ER 20 MG PO CP24: 20.0000 mg | ORAL_CAPSULE | Freq: Every day | ORAL | Status: DC

## 2010-07-06 MED ORDER — CITALOPRAM HYDROBROMIDE 20 MG PO TABS: ORAL_TABLET | ORAL | Status: DC

## 2010-07-06 MED ORDER — ALPRAZOLAM 0.5 MG PO TABS: 0.5000 mg | ORAL_TABLET | Freq: Three times a day (TID) | ORAL | Status: DC | PRN

## 2010-07-06 MED ORDER — BUSPIRONE HCL 15 MG PO TABS: 15.0000 mg | ORAL_TABLET | Freq: Two times a day (BID) | ORAL | Status: DC

## 2010-07-06 NOTE — Progress Notes (Signed)
Subjective:    Patient ID: James Camacho, male    DOB: 21-Jul-1950, 60 y.o.   MRN: [id number]  HPI Doing okay  Still with some early AM anxiety---seems to resolve soon after taking his meds No sig daytime problems No depression  Sleeping well Uses 1 temazepam every night--doing well on just the 15mg  in general occ restless at night and takes a second one Generally no AM somnolence  Checks BP periodically Generally 130-140/75-80 Now starting to do some walking Trying to cut back on eating--wife trying to eat with diabetic diet  Current outpatient prescriptions:ALPRAZolam (XANAX) 0.5 MG tablet, 1-2 tabs by mouth three times a day as needed for anxiety , Disp: , Rfl: ;  amLODipine-olmesartan (AZOR) 10-40 MG per tablet, Take 1 tablet by mouth daily.  , Disp: , Rfl: ;  aspirin 81 MG tablet, Take 81 mg by mouth daily.  , Disp: , Rfl: ;  busPIRone (BUSPAR) 15 MG tablet, Take 15 mg by mouth 2 (two) times daily.  , Disp: , Rfl:  carvedilol (COREG CR) 20 MG 24 hr capsule, Take 20 mg by mouth daily.  , Disp: , Rfl: ;  citalopram (CELEXA) 20 MG tablet, TAKE 1 TABLET BY MOUTH TWICE A DAY, Disp: 60 tablet, Rfl: 0;  quinapril (ACCUPRIL) 40 MG tablet, Take 40 mg by mouth at bedtime.  , Disp: , Rfl: ;  tadalafil (CIALIS) 20 MG tablet, 1/2 - 1 tab about 1 hour or more before sex , Disp: , Rfl:  temazepam (RESTORIL) 15 MG capsule, 1-2 at bedtime as needed to help sleep , Disp: , Rfl:   Past Medical History  Diagnosis Date  . Hypertension   . Anxiety   . ED (erectile dysfunction)     Past Surgical History  Procedure Date  . Appendectomy     with chole  . Cholecystectomy     done by Dr Okey Dupre in 1980's  . Cardiovascular stress test 02/09    negative    Family History  Problem Relation Age of Onset  . Diabetes Mother   . Arthritis Mother   . Hypertension Mother   . Parkinsonism Father   . Heart disease Father   . Cancer Neg Hx     no colon or prostate cancer    History   Social  History  . Marital Status: Married    Spouse Name: N/A    Number of Children: 3  . Years of Education: N/A   Occupational History  . 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 Smoking status: Never Smoker   . Smokeless tobacco: Not on file  . Alcohol Use: Yes     occasional  . Drug Use: Not on file  . Sexually Active: Not on file   Other Topics Concern  . Not on file   Social History Narrative   Regular exercise-no.  Hopes to start with [employer]   Review of Systems Weight back up about 15# Bowels are fine Appetite is good    Objective:   Physical Exam  Constitutional: He appears well-developed and well-nourished. No distress.  Neck: Normal range of motion. Neck supple. No thyromegaly present.  Cardiovascular: Normal rate, regular rhythm, normal heart sounds and intact distal pulses.  Exam reveals no gallop.   No murmur heard. Pulmonary/Chest: Effort normal and breath sounds normal. No respiratory distress. He has no wheezes. He has no rales.  Musculoskeletal: Normal range of motion. He exhibits no  edema and no tenderness.  Lymphadenopathy:    He has no cervical adenopathy.  Psychiatric: He has a normal mood and affect. His behavior is normal. Judgment and thought content normal.          Assessment & Plan:

## 2010-07-20 NOTE — Procedures (Signed)
[name]                              EXERCISE TREADMILL   [name], [name]                    MRN:          [id number]  DATE:04/17/2007                            DOB:          June 02, 1950    Patient is a 60 year old with hypertension, chest pain, rule out  coronary disease.   Patient exercised for 8 minutes, 15 seconds on the standard Bruce  protocol.  Exercise was stopped due to fatigue.  Maximum heart rate was  155, peak blood pressure was 167/80.  Resting EKG was normal.  With  stress, there was no ischemia.   IMPRESSION:  Negative exercise stress test at a fair work load.     James Camacho. Eden Emms, MD, [facility]  Electronically Signed    PCN/MedQ  DD: 04/17/2007  DT: 04/18/2007  Job #: [id number]   cc:   Karie Schwalbe, MD  Arta Silence, MD  [facility]

## 2010-07-20 NOTE — Assessment & Plan Note (Signed)
[facility]ALTHCARE                            CARDIOLOGY OFFICE NOTE   ISOM, KOCHAN                    MRN:          [id number]  DATE:04/17/2007                            DOB:          Oct 03, 1950    Mr. Shake is a pleasant 60 year old patient referred from [facility]  ER and Dr. Dossie Arbour for chest pain.   The patient has no previous history of coronary artery disease.  He is a  nonsmoker, non-diabetic. He says his cholesterol was fine.  He does have  significant hypertension.   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  He had significant chest pain radiating up to his neck. His face  turned red. He had some paresthesias in his arms.  This lasted about 15-  20 minutes.  He went to the [facility] ER and was cleared without any  significant EKG changes.  Blood work was normal.   He is now referred for followup.  The patient in general is sedentary.  He does not have a lot of exercise in his regimen. Outside of  hypertension, his risk factors are negative.   He in general does not get palpitations, exertional dyspnea, PND, or  orthopnea.  He has not had cough, pleuritic chest pain or lower  extremity edema.  He has not had a previous stress test before.  There  has been no previously documented cardiac disease.   He has not had a previous TIA or CVA.   PAST MEDICAL HISTORY:  Includes hypertension.  He is on three drugs  right now and apparently has been somewhat refractory to Dr. Christell Faith  therapy.  He has had a previous cholecystectomy.   Otherwise negative.   CURRENT MEDICATIONS:  1. Coreg 20 a day.  2. Quinapril 40 days.  3. AZOR 10/40.  4. Lorazepam 1 mg a day.   He is allergic to PENICILLIN.   FAMILY HISTORY:  Noncontributory.   SOCIAL HISTORY:  The patient is happily married.  His wife is lovely and  was with him today.  They have three children.  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 All of the children are involved  with the businesses. They have a place at [facility]. He needs  to increase his activity levels.  He drinks on rare occasion and does  not smoke.   PHYSICAL EXAMINATION:  GENERAL:  His exam is remarkable for an  overweight white male in no distress.  Affect is appropriate.  VITAL SIGNS:  Weight is 246. Blood pressure is 136/85, pulse 80 and  regular, afebrile.  HEENT:  Unremarkable.  NECK:  Carotids are without bruit, no lymphadenopathy, thyromegaly, or  JVP elevation.  LUNGS:  Clear with good diaphragmatic motion, no wheezing.  CARDIAC:  S1-S2, normal heart sounds.  PMI normal.  ABDOMEN:  Benign.  Bowel sounds positive.  No AAA.  No  hepatosplenomegaly or hepatojugular reflux.  He is status post  cholecystectomy with an old fashioned scar in the right upper quadrant  EXTREMITIES:  Distal pulses intact, no edema.  NEUROLOGIC:  Nonfocal.  SKIN:  Warm and dry.   EKG is normal.   IMPRESSION:  1. Chest pain in the setting of an argument with his son, probably not      coronary disease, normal EKG.  Followup treadmill in the office      today.  2. Hypertension.  Follow up with Dr. Dossie Arbour or Dr. Hetty Ely if he      prefers to switch primary care docs. Continue of quinapril, AZOR,      and Coreg.  I explained to the patient if he would follow a strict      low-salt diet and lose 10 pounds, he could probably come down to 2      medications.  3. History of cholecystectomy.  No biliary colic. Continue low-fat      diet.  4. Health maintenance.  I did review the patient's lab work from Dr.      Christell Faith office.  He had a normal TSH, normal PSA. He had no      glucose in his urine. His LDL cholesterol was 118. In regards to      his hypercholesterolemia, I think diet therapy alone would be in      order.   Further recommendation will be based on results of his treadmill.  Hopefully will not have to a follow up with an  imaging study.     Noralyn Pick. Eden Emms, MD, [facility]  Electronically Signed    PCN/MedQ  DD: 04/17/2007  DT: 04/18/2007  Job #: [id number]   cc:   Arta Silence, MD

## 2010-07-29 ENCOUNTER — Other Ambulatory Visit: Payer: Self-pay | Admitting: *Deleted

## 2010-07-29 NOTE — Telephone Encounter (Signed)
Okay #60 x 1 

## 2010-07-30 ENCOUNTER — Other Ambulatory Visit: Payer: Self-pay | Admitting: *Deleted

## 2010-07-30 MED ORDER — TEMAZEPAM 15 MG PO CAPS: ORAL_CAPSULE | ORAL | Status: DC

## 2010-08-03 NOTE — Telephone Encounter (Signed)
rx called in by Jacki Cones, 07/30/10.

## 2010-08-10 ENCOUNTER — Telehealth: Payer: Self-pay | Admitting: *Deleted

## 2010-08-10 NOTE — Telephone Encounter (Signed)
Pt on Buspar and Celexa, but c/o a constant feeling of anxiety, over-anxiousness during the daytime. Started about 2 weeks ago and has not improved. He wants to know if you think his meds need to be adjusted? He states he is sleeping well. [facility] [place]

## 2010-08-10 NOTE — Telephone Encounter (Signed)
May be reasonable to try increasing the buspirone to 20mg  twice a day (they break in 1/3rds) If not improved in 2-3 weeks, should set up appt to review options

## 2010-08-11 NOTE — Telephone Encounter (Signed)
Left message on machine to have patient return my call 

## 2010-08-12 NOTE — Telephone Encounter (Signed)
Spoke with patient and advised results, patient will call if any problems.

## 2010-09-16 ENCOUNTER — Encounter: Payer: Self-pay | Admitting: Internal Medicine

## 2010-09-16 ENCOUNTER — Ambulatory Visit (INDEPENDENT_AMBULATORY_CARE_PROVIDER_SITE_OTHER): Payer: BC Managed Care – PPO | Admitting: Internal Medicine

## 2010-09-16 DIAGNOSIS — L723 Sebaceous cyst: Secondary | ICD-10-CM

## 2010-09-16 DIAGNOSIS — F411 Generalized anxiety disorder: Secondary | ICD-10-CM

## 2010-09-16 MED ORDER — BUSPIRONE HCL 15 MG PO TABS: 15.0000 mg | ORAL_TABLET | Freq: Two times a day (BID) | ORAL | Status: DC

## 2010-09-16 NOTE — Assessment & Plan Note (Signed)
Ongoing AM problems Has just increased buspirone at night but too soon to tell if improved Will wait several weeks If no better, will add bupropion 150mg  a day and increase as needed. Then try to wean off citalopram

## 2010-09-16 NOTE — Patient Instructions (Signed)
Please call if the morning anxiety isn't better within about 1 month ---we will make the medication change we discussed

## 2010-09-16 NOTE — Assessment & Plan Note (Signed)
Clearly had infection last week With drainage, etc, it seems to be better now Immaterial what the infection was with (he was worried about MRSA), since drainage was successful at clearing Discussed excision---would not recommend now

## 2010-09-16 NOTE — Progress Notes (Signed)
Subjective:    Patient ID: James Camacho, male    DOB: 1950-12-15, 60 y.o.   MRN: [id number]  HPI Has a spot on his back that seemed to be a boil Away on vacation and got really inflamed Finally burst and had pus extruding over several days Pain seems better--just has "scratchy feeling inside" No fever Used peroxide and alcohol on it Has kept it covered with bandage  Anxiety has been okay except for first thing in the morning Did try increasing the buspirone BID dose is 7-8AM and night is 10-11PM occ uses half of alprazolam during the day May take an hour for the buspirone to kick in and get him calm again  Current Outpatient Prescriptions on File Prior to Visit  Medication Sig Dispense Refill  . amLODipine-olmesartan (AZOR) 10-40 MG per tablet Take 1 tablet by mouth daily.  90 tablet  3  . aspirin 81 MG tablet Take 81 mg by mouth daily.        . busPIRone (BUSPAR) 15 MG tablet Take 1 tablet (15 mg total) by mouth 2 (two) times daily.  180 tablet  3  . carvedilol (COREG CR) 20 MG 24 hr capsule Take 1 capsule (20 mg total) by mouth daily.  90 capsule  3  . citalopram (CELEXA) 20 MG tablet TAKE 1 TABLET BY MOUTH TWICE A DAY  180 tablet  3  . quinapril (ACCUPRIL) 40 MG tablet Take 1 tablet (40 mg total) by mouth at bedtime.  90 tablet  3  . tadalafil (CIALIS) 20 MG tablet 1/2 - 1 tab about 1 hour or more before sex  6 tablet  3  . temazepam (RESTORIL) 15 MG capsule 1-2 at bedtime as needed to help sleep  60 capsule  1    Allergies  Allergen Reactions  . Penicillins   . Shellfish Allergy     Past Medical History  Diagnosis Date  . Hypertension   . Anxiety   . ED (erectile dysfunction)     Past Surgical History  Procedure Date  . Appendectomy     with chole  . Cholecystectomy     done by Dr Okey Dupre in 1980's  . Cardiovascular stress test 02/09    negative    Family History  Problem Relation Age of Onset  . Diabetes Mother   . Arthritis Mother   . Hypertension  Mother   . Parkinsonism Father   . Heart disease Father   . Cancer Neg Hx     no colon or prostate cancer    History   Social History  . Marital Status: Married    Spouse Name: N/A    Number of Children: 3  . Years of Education: N/A   Occupational History  . 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 Smoking status: Never Smoker   . Smokeless tobacco: Not on file  . Alcohol Use: Yes     occasional  . Drug Use: Not on file  . Sexually Active: Not on file   Other Topics Concern  . Not on file   Social History Narrative   Regular exercise-no.  Hopes to start with [employer]   Review of Systems Sleeps well Appetite is fine also    Objective:   Physical Exam  Constitutional: He appears well-developed and well-nourished. No distress.  Skin:       Indurated ~2.5cm cyst in mid thoracic back on left back Not warm and  tender now  Psychiatric: He has a normal mood and affect. His behavior is normal. Judgment and thought content normal.          Assessment & Plan:

## 2010-09-30 ENCOUNTER — Other Ambulatory Visit: Payer: Self-pay | Admitting: *Deleted

## 2010-09-30 MED ORDER — TEMAZEPAM 15 MG PO CAPS: ORAL_CAPSULE | ORAL | Status: DC

## 2010-09-30 NOTE — Telephone Encounter (Signed)
Please call in

## 2010-09-30 NOTE — Telephone Encounter (Signed)
[name] PATIENT, ok to refill? 

## 2010-10-01 NOTE — Telephone Encounter (Signed)
rx called into pharmacy

## 2010-10-06 ENCOUNTER — Telehealth: Payer: Self-pay | Admitting: *Deleted

## 2010-10-06 MED ORDER — BUPROPION HCL ER (XL) 150 MG PO TB24: 150.0000 mg | ORAL_TABLET | Freq: Every day | ORAL | Status: DC

## 2010-10-06 NOTE — Telephone Encounter (Signed)
Pt has been taking wellbutrin, one and one half in the morning and two at bedtime, but he says this isnt working for him.  He still has anxiety in the mornings, no matter what time he wakes up.  He would like to try wellbutrin.  [facility] [place].

## 2010-10-06 NOTE — Telephone Encounter (Signed)
Do you mean the buspirone?????  If so, we had discussed starting bupropion XL (24 hour) 150mg  daily #30 x 3 Make sure he has an appt from 1-2 months to review how he is doing with the new med

## 2010-10-06 NOTE — Telephone Encounter (Signed)
Advised pt, [facility] [place], follow up appt made.

## 2010-10-21 ENCOUNTER — Other Ambulatory Visit: Payer: Self-pay | Admitting: Internal Medicine

## 2010-11-23 ENCOUNTER — Ambulatory Visit (INDEPENDENT_AMBULATORY_CARE_PROVIDER_SITE_OTHER): Payer: BC Managed Care – PPO | Admitting: Internal Medicine

## 2010-11-23 DIAGNOSIS — F411 Generalized anxiety disorder: Secondary | ICD-10-CM

## 2010-11-23 DIAGNOSIS — J019 Acute sinusitis, unspecified: Secondary | ICD-10-CM

## 2010-11-23 MED ORDER — ALPRAZOLAM 0.5 MG PO TABS: 0.5000 mg | ORAL_TABLET | Freq: Three times a day (TID) | ORAL | Status: DC | PRN

## 2010-11-23 MED ORDER — BUPROPION HCL ER (SR) 150 MG PO TB12: 150.0000 mg | ORAL_TABLET | Freq: Two times a day (BID) | ORAL | Status: DC

## 2010-11-24 ENCOUNTER — Ambulatory Visit: Payer: BC Managed Care – PPO | Admitting: Internal Medicine

## 2010-11-24 DIAGNOSIS — J019 Acute sinusitis, unspecified: Secondary | ICD-10-CM | POA: Insufficient documentation

## 2010-11-24 NOTE — Progress Notes (Signed)
  Subjective:    Patient ID: James Camacho, male    DOB: [birth year], 60 y.o.   MRN: [id number]  HPI See scanned note   Review of Systems     Objective:   Physical Exam        Assessment & Plan:

## 2010-12-21 ENCOUNTER — Other Ambulatory Visit: Payer: Self-pay | Admitting: *Deleted

## 2010-12-21 MED ORDER — TEMAZEPAM 15 MG PO CAPS: 15.0000 mg | ORAL_CAPSULE | Freq: Every evening | ORAL | Status: DC | PRN

## 2010-12-21 NOTE — Telephone Encounter (Signed)
Ok to refill 

## 2010-12-21 NOTE — Telephone Encounter (Signed)
rx called into pharmacy

## 2010-12-21 NOTE — Telephone Encounter (Signed)
Okay #60 x 1 

## 2011-01-11 ENCOUNTER — Ambulatory Visit (INDEPENDENT_AMBULATORY_CARE_PROVIDER_SITE_OTHER): Payer: BC Managed Care – PPO | Admitting: Internal Medicine

## 2011-01-11 ENCOUNTER — Encounter: Payer: Self-pay | Admitting: Internal Medicine

## 2011-01-11 VITALS — BP 130/71 | HR 67 | Temp 98.8°F | Ht 68.0 in | Wt 253.0 lb

## 2011-01-11 DIAGNOSIS — L570 Actinic keratosis: Secondary | ICD-10-CM

## 2011-01-11 DIAGNOSIS — F411 Generalized anxiety disorder: Secondary | ICD-10-CM

## 2011-01-11 DIAGNOSIS — I10 Essential (primary) hypertension: Secondary | ICD-10-CM

## 2011-01-11 DIAGNOSIS — Z0000 Encounter for general adult medical examination without abnormal findings: Secondary | ICD-10-CM | POA: Insufficient documentation

## 2011-01-11 LAB — CBC WITH DIFFERENTIAL/PLATELET
Eosinophils Relative: 0.8 % (ref 0.0–5.0)
HCT: 45.7 % (ref 39.0–52.0)
Lymphocytes Relative: 18.9 % (ref 12.0–46.0)
Lymphs Abs: 1.1 10*3/uL (ref 0.7–4.0)
Monocytes Relative: 8.3 % (ref 3.0–12.0)
Platelets: 194 10*3/uL (ref 150.0–400.0)
WBC: 6 10*3/uL (ref 4.5–10.5)

## 2011-01-11 LAB — HEPATIC FUNCTION PANEL
Alkaline Phosphatase: 49 U/L (ref 39–117)
Bilirubin, Direct: 0.1 mg/dL (ref 0.0–0.3)
Total Bilirubin: 0.8 mg/dL (ref 0.3–1.2)
Total Protein: 6.9 g/dL (ref 6.0–8.3)

## 2011-01-11 LAB — LIPID PANEL
Cholesterol: 175 mg/dL (ref 0–200)
LDL Cholesterol: 124 mg/dL — ABNORMAL HIGH (ref 0–99)
VLDL: 14.2 mg/dL (ref 0.0–40.0)

## 2011-01-11 LAB — BASIC METABOLIC PANEL
BUN: 11 mg/dL (ref 6–23)
Calcium: 9 mg/dL (ref 8.4–10.5)
GFR: 94.32 mL/min (ref >60.00)
Potassium: 4.3 mEq/L (ref 3.5–5.1)
Sodium: 140 mEq/L (ref 135–145)

## 2011-01-11 LAB — PSA: PSA: 0.63 ng/mL (ref 0.10–4.00)

## 2011-01-11 LAB — TSH: TSH: 0.75 u[IU]/mL (ref 0.35–5.50)

## 2011-01-11 NOTE — Assessment & Plan Note (Signed)
Satisfied with his combination Rx No changes now

## 2011-01-11 NOTE — Assessment & Plan Note (Signed)
Generally healthy but out of shape Discussed fitness Will check PSA Colon in 2014 Flu shot today

## 2011-01-11 NOTE — Progress Notes (Signed)
Subjective:    Patient ID: James Camacho, male    DOB: 01-24-1951, 60 y.o.   MRN: [id number]  HPI Doing okay Feels that it took a while to adjust to the higher dose of bupropion Still uses xanax bid No depression or sleep problems  Discussed PSA screening Due for colonoscopy 2014  Current Outpatient Prescriptions on File Prior to Visit  Medication Sig Dispense Refill  . ALPRAZolam (XANAX) 0.5 MG tablet Take 1-2 tablets (0.5-1 mg total) by mouth 3 (three) times daily as needed.  60 tablet  0  . amLODipine-olmesartan (AZOR) 10-40 MG per tablet Take 1 tablet by mouth daily.  90 tablet  3  . aspirin 81 MG tablet Take 81 mg by mouth daily.        Marland Kitchen buPROPion (WELLBUTRIN SR) 150 MG 12 hr tablet Take 1 tablet (150 mg total) by mouth 2 (two) times daily.  60 tablet  11  . busPIRone (BUSPAR) 15 MG tablet Take 1 tablet (15 mg total) by mouth 2 (two) times daily. With an extra 1/2 tab in the evening  75 tablet  11  . citalopram (CELEXA) 20 MG tablet TAKE 1 TABLET BY MOUTH TWICE A DAY  180 tablet  3  . COREG CR 20 MG 24 hr capsule TAKE ONE CAPSULE BY MOUTH EVERY DAY  30 capsule  11  . quinapril (ACCUPRIL) 40 MG tablet Take 1 tablet (40 mg total) by mouth at bedtime.  90 tablet  3  . tadalafil (CIALIS) 20 MG tablet 1/2 - 1 tab about 1 hour or more before sex  6 tablet  3  . temazepam (RESTORIL) 15 MG capsule Take 1-2 capsules (15-30 mg total) by mouth at bedtime as needed for sleep.  60 capsule  1    Allergies  Allergen Reactions  . Penicillins   . Shellfish Allergy     Past Medical History  Diagnosis Date  . Hypertension   . Anxiety   . ED (erectile dysfunction)     Past Surgical History  Procedure Date  . Appendectomy     with chole  . Cholecystectomy     done by Dr Okey Dupre in 1980's  . Cardiovascular stress test 02/09    negative    Family History  Problem Relation Age of Onset  . Diabetes Mother   . Arthritis Mother   . Hypertension Mother   . Parkinsonism Father     . Heart disease Father   . Cancer Neg Hx     no colon or prostate cancer    History   Social History  . Marital Status: Married    Spouse Name: N/A    Number of Children: 3  . Years of Education: N/A   Occupational History  . 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 Smoking status: Never Smoker   . Smokeless tobacco: Never Used  . Alcohol Use: Yes     occasional  . Drug Use: Not on file  . Sexually Active: Not on file   Other Topics Concern  . Not on file   Social History Narrative   Regular exercise-no.  Hopes to start with [employer]   Review of Systems  Constitutional: Negative for activity change and unexpected weight change.       Not exercising---may start soon. Has treadmill and elliptical at home Wears seat belt  HENT: Positive for hearing loss. Negative for congestion, rhinorrhea, dental problem and tinnitus.  Wears hearing aides Regular with dentist  Eyes: Negative for visual disturbance.       No diplopia or unilateral vision loss  Respiratory: Negative for cough, chest tightness and shortness of breath.   Cardiovascular: Positive for leg swelling. Negative for chest pain and palpitations.       Mild evening swelling in ankles  Gastrointestinal: Negative for nausea, vomiting, abdominal pain, constipation and blood in stool.       No heartburn  Genitourinary: Negative for dysuria, urgency and frequency.       Occ mild dribbling No sexual problems  Musculoskeletal: Positive for back pain. Negative for joint swelling and arthralgias.       Has mild bulging disc in back---sees chiropractor Dr Georgina Snell and gets relief  Skin: Negative for rash.       Has scaly red areas on arms---he uses the OTC freeze Rx with some success  Neurological: Negative for dizziness, syncope, weakness, light-headedness, numbness and headaches.  Hematological: Negative for adenopathy. Does not bruise/bleed easily.   Psychiatric/Behavioral: Negative for sleep disturbance and dysphoric mood. The patient is nervous/anxious.        Objective:   Physical Exam  Constitutional: He is oriented to person, place, and time. He appears well-developed and well-nourished. No distress.  HENT:  Head: Normocephalic and atraumatic.  Right Ear: External ear normal.  Left Ear: External ear normal.  Mouth/Throat: Oropharynx is clear and moist. No oropharyngeal exudate.  Eyes: Conjunctivae and EOM are normal. Pupils are equal, round, and reactive to light.  Neck: Normal range of motion. Neck supple. No thyromegaly present.  Cardiovascular: Normal rate, regular rhythm, normal heart sounds and intact distal pulses.  Exam reveals no gallop.   No murmur heard. Pulmonary/Chest: Effort normal and breath sounds normal. No respiratory distress. He has no wheezes. He has no rales.  Abdominal: Soft. There is no tenderness.  Musculoskeletal: Normal range of motion. He exhibits no edema and no tenderness.  Lymphadenopathy:    He has no cervical adenopathy.  Neurological: He is alert and oriented to person, place, and time.  Skin: No erythema.       4 actinics---3 on left forearm and 1 on right forearm Multiple seb keratoses  Psychiatric: He has a normal mood and affect. His behavior is normal. Judgment and thought content normal.          Assessment & Plan:

## 2011-01-11 NOTE — Assessment & Plan Note (Signed)
BP Readings from Last 3 Encounters:  01/11/11 130/71  09/16/10 128/78  07/06/10 130/80   Good control on multiple meds Due for labs

## 2011-01-11 NOTE — Assessment & Plan Note (Signed)
4 lesions treated with liquid nitrogen 40 seconds x 2 each Discussed home care

## 2011-01-28 ENCOUNTER — Other Ambulatory Visit: Payer: Self-pay | Admitting: *Deleted

## 2011-01-28 MED ORDER — BUPROPION HCL ER (SR) 150 MG PO TB12: 150.0000 mg | ORAL_TABLET | Freq: Two times a day (BID) | ORAL | Status: DC

## 2011-01-28 NOTE — Telephone Encounter (Signed)
Faxed request from [facility] [place],

## 2011-02-24 ENCOUNTER — Telehealth: Payer: Self-pay | Admitting: Internal Medicine

## 2011-02-24 MED ORDER — ALPRAZOLAM 0.5 MG PO TABS: 0.5000 mg | ORAL_TABLET | Freq: Three times a day (TID) | ORAL | Status: DC | PRN

## 2011-02-24 NOTE — Telephone Encounter (Signed)
Okay #60 x 0 

## 2011-02-24 NOTE — Telephone Encounter (Signed)
Needs Xanax refill

## 2011-02-24 NOTE — Telephone Encounter (Signed)
rx called into pharmacy

## 2011-03-29 ENCOUNTER — Other Ambulatory Visit: Payer: Self-pay | Admitting: *Deleted

## 2011-03-29 MED ORDER — ALPRAZOLAM 0.5 MG PO TABS: 0.5000 mg | ORAL_TABLET | Freq: Three times a day (TID) | ORAL | Status: DC | PRN

## 2011-03-29 NOTE — Telephone Encounter (Signed)
rx called into pharmacy

## 2011-03-29 NOTE — Telephone Encounter (Signed)
Okay #60 x 1 

## 2011-04-27 ENCOUNTER — Other Ambulatory Visit: Payer: Self-pay

## 2011-04-27 MED ORDER — TEMAZEPAM 15 MG PO CAPS: 15.0000 mg | ORAL_CAPSULE | Freq: Every evening | ORAL | Status: DC | PRN

## 2011-04-27 NOTE — Telephone Encounter (Signed)
Faxed request received from Pharmacy for patients medication.  Okay to refill?

## 2011-04-27 NOTE — Telephone Encounter (Signed)
Okay #60 x 1 

## 2011-04-27 NOTE — Telephone Encounter (Signed)
rx called into pharmacy

## 2011-05-26 ENCOUNTER — Other Ambulatory Visit: Payer: Self-pay | Admitting: *Deleted

## 2011-05-26 MED ORDER — ALPRAZOLAM 0.5 MG PO TABS: 0.5000 mg | ORAL_TABLET | Freq: Three times a day (TID) | ORAL | Status: DC | PRN

## 2011-05-26 NOTE — Telephone Encounter (Signed)
rx called into pharmacy

## 2011-05-26 NOTE — Telephone Encounter (Signed)
Okay #60 x 0 

## 2011-06-25 ENCOUNTER — Other Ambulatory Visit: Payer: Self-pay | Admitting: Internal Medicine

## 2011-06-27 ENCOUNTER — Other Ambulatory Visit: Payer: Self-pay | Admitting: *Deleted

## 2011-06-27 MED ORDER — ALPRAZOLAM 0.5 MG PO TABS: 0.5000 mg | ORAL_TABLET | Freq: Three times a day (TID) | ORAL | Status: DC | PRN

## 2011-06-27 NOTE — Telephone Encounter (Signed)
Okay #60 x 0 

## 2011-06-27 NOTE — Telephone Encounter (Signed)
rx called into pharmacy

## 2011-07-12 ENCOUNTER — Ambulatory Visit (INDEPENDENT_AMBULATORY_CARE_PROVIDER_SITE_OTHER): Payer: BC Managed Care – PPO | Admitting: Internal Medicine

## 2011-07-12 ENCOUNTER — Encounter: Payer: Self-pay | Admitting: Internal Medicine

## 2011-07-12 VITALS — BP 140/80 | HR 52 | Ht 68.0 in | Wt 242.0 lb

## 2011-07-12 DIAGNOSIS — I10 Essential (primary) hypertension: Secondary | ICD-10-CM

## 2011-07-12 DIAGNOSIS — G478 Other sleep disorders: Secondary | ICD-10-CM

## 2011-07-12 DIAGNOSIS — F411 Generalized anxiety disorder: Secondary | ICD-10-CM

## 2011-07-12 DIAGNOSIS — G479 Sleep disorder, unspecified: Secondary | ICD-10-CM

## 2011-07-12 DIAGNOSIS — E669 Obesity, unspecified: Secondary | ICD-10-CM

## 2011-07-12 MED ORDER — TEMAZEPAM 15 MG PO CAPS: 15.0000 mg | ORAL_CAPSULE | Freq: Every evening | ORAL | Status: DC | PRN

## 2011-07-12 NOTE — Assessment & Plan Note (Signed)
Sleeps well with temazepam Discussed reducing benzodiazepine use over time

## 2011-07-12 NOTE — Assessment & Plan Note (Addendum)
Controlled with buspar, citalopram and xanax Discussed holding evening dose if he takes the temazepam

## 2011-07-12 NOTE — Progress Notes (Signed)
Subjective:    Patient ID: James Camacho, male    DOB: 27-Nov-1950, 60 y.o.   MRN: [id number]  HPI Feels great Has been walking more Weight down 10# More careful with diet---taking herbalife supplements instead of snacks Was down more but needed prednisone recently for an allergic reaction  Mood has been is good No depression Has noted some memory loss---forgets some things that people tell him. Believes it may be related to "multitasking" Anxiety is controlled as well Uses the xanax bid   Sleeps okay Uses the temazepam every night.  Has tried without it---or went to every other night-----did okay for a while Nocturia x 1 --no change  No chest pain No SOB Has noticed some improvement in stamina  Current Outpatient Prescriptions on File Prior to Visit  Medication Sig Dispense Refill  . ALPRAZolam (XANAX) 0.5 MG tablet Take 1-2 tablets (0.5-1 mg total) by mouth 3 (three) times daily as needed.  60 tablet  0  . aspirin 81 MG tablet Take 81 mg by mouth daily.        . AZOR 10-40 MG per tablet TAKE 1 TABLET BY MOUTH EVERY DAY  90 tablet  3  . buPROPion (WELLBUTRIN SR) 150 MG 12 hr tablet Take 1 tablet (150 mg total) by mouth 2 (two) times daily.  60 tablet  5  . busPIRone (BUSPAR) 15 MG tablet Take 1 tablet (15 mg total) by mouth 2 (two) times daily. With an extra 1/2 tab in the evening  75 tablet  11  . citalopram (CELEXA) 20 MG tablet TAKE 1 TABLET BY MOUTH TWICE A DAY  180 tablet  3  . COREG CR 20 MG 24 hr capsule TAKE ONE CAPSULE BY MOUTH EVERY DAY  30 capsule  11  . quinapril (ACCUPRIL) 40 MG tablet TAKE 1 TABLET (40 MG TOTAL) BY MOUTH AT BEDTIME.  90 tablet  3  . tadalafil (CIALIS) 20 MG tablet 1/2 - 1 tab about 1 hour or more before sex  6 tablet  3  . temazepam (RESTORIL) 15 MG capsule Take 1-2 capsules (15-30 mg total) by mouth at bedtime as needed for sleep.  60 capsule  1    Allergies  Allergen Reactions  . Penicillins   . Shellfish Allergy     Past Medical  History  Diagnosis Date  . Hypertension   . Anxiety   . ED (erectile dysfunction)     Past Surgical History  Procedure Date  . Appendectomy     with chole  . Cholecystectomy     done by Dr Okey Dupre in 1980's  . Cardiovascular stress test 02/09    negative    Family History  Problem Relation Age of Onset  . Diabetes Mother   . Arthritis Mother   . Hypertension Mother   . Parkinsonism Father   . Heart disease Father   . Cancer Neg Hx     no colon or prostate cancer    History   Social History  . Marital Status: Married    Spouse Name: N/A    Number of Children: 3  . Years of Education: N/A   Occupational History  . 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 Smoking status: Never Smoker   . Smokeless tobacco: Never Used  . Alcohol Use: Yes     occasional  . Drug Use: Not on file  . Sexually Active: Not on file   Other Topics Concern  .  Not on file   Social History Narrative   Regular exercise-no.  Hopes to start with [employer]   Review of Systems Still satisfied with cialis No daytime urinary urgency    Objective:   Physical Exam  Constitutional: He appears well-developed and well-nourished. No distress.  Neck: Normal range of motion. Neck supple. No thyromegaly present.  Cardiovascular: Normal rate, regular rhythm and normal heart sounds.  Exam reveals no gallop.   No murmur heard. Pulmonary/Chest: Effort normal and breath sounds normal. No respiratory distress. He has no wheezes. He has no rales.  Musculoskeletal: He exhibits no edema and no tenderness.  Lymphadenopathy:    He has no cervical adenopathy.  Psychiatric: He has a normal mood and affect. His behavior is normal. Thought content normal.          Assessment & Plan:

## 2011-07-12 NOTE — Assessment & Plan Note (Signed)
BP Readings from Last 3 Encounters:  07/12/11 140/80  01/11/11 130/71  09/16/10 128/78   Still okay but has missed the azor for a while because pharmacy was out of it Will continue for now May be able to wean over time

## 2011-07-12 NOTE — Assessment & Plan Note (Signed)
Discussed fitness and continued weight loss Impaired fasting glucose also

## 2011-07-13 ENCOUNTER — Other Ambulatory Visit: Payer: Self-pay | Admitting: *Deleted

## 2011-07-13 MED ORDER — CITALOPRAM HYDROBROMIDE 20 MG PO TABS: ORAL_TABLET | ORAL | Status: DC

## 2011-08-02 ENCOUNTER — Other Ambulatory Visit: Payer: Self-pay | Admitting: *Deleted

## 2011-08-02 MED ORDER — ALPRAZOLAM 0.5 MG PO TABS: 0.5000 mg | ORAL_TABLET | Freq: Three times a day (TID) | ORAL | Status: DC | PRN

## 2011-08-02 NOTE — Telephone Encounter (Signed)
rx called into pharmacy

## 2011-08-02 NOTE — Telephone Encounter (Signed)
Okay #60 x 0 

## 2011-08-12 ENCOUNTER — Other Ambulatory Visit: Payer: Self-pay | Admitting: *Deleted

## 2011-08-12 MED ORDER — BUPROPION HCL ER (SR) 150 MG PO TB12: 150.0000 mg | ORAL_TABLET | Freq: Two times a day (BID) | ORAL | Status: DC

## 2011-09-12 ENCOUNTER — Other Ambulatory Visit: Payer: Self-pay | Admitting: *Deleted

## 2011-09-12 MED ORDER — ALPRAZOLAM 0.5 MG PO TABS: 0.5000 mg | ORAL_TABLET | Freq: Three times a day (TID) | ORAL | Status: DC | PRN

## 2011-09-12 NOTE — Telephone Encounter (Signed)
rx called into pharmacy

## 2011-09-12 NOTE — Telephone Encounter (Signed)
Okay #60 x 0 

## 2011-10-10 ENCOUNTER — Other Ambulatory Visit: Payer: Self-pay | Admitting: *Deleted

## 2011-10-10 MED ORDER — ALPRAZOLAM 0.5 MG PO TABS: 0.5000 mg | ORAL_TABLET | Freq: Three times a day (TID) | ORAL | Status: DC | PRN

## 2011-10-10 NOTE — Telephone Encounter (Signed)
rx called into pharmacy

## 2011-10-10 NOTE — Telephone Encounter (Signed)
[name] PATIENT, ok to refill? Last refilled 09/12/11

## 2011-10-24 ENCOUNTER — Other Ambulatory Visit: Payer: Self-pay | Admitting: Internal Medicine

## 2011-10-30 ENCOUNTER — Other Ambulatory Visit: Payer: Self-pay | Admitting: Internal Medicine

## 2011-11-14 ENCOUNTER — Other Ambulatory Visit: Payer: Self-pay | Admitting: *Deleted

## 2011-11-14 MED ORDER — ALPRAZOLAM 0.5 MG PO TABS: 0.5000 mg | ORAL_TABLET | Freq: Three times a day (TID) | ORAL | Status: DC | PRN

## 2011-11-14 NOTE — Telephone Encounter (Signed)
letvak patient, last filled 10/10/11, ok to fill?

## 2011-11-14 NOTE — Telephone Encounter (Signed)
Please call in

## 2011-11-14 NOTE — Telephone Encounter (Signed)
Medication phoned to pharmacy.  

## 2011-12-17 ENCOUNTER — Other Ambulatory Visit: Payer: Self-pay | Admitting: Family Medicine

## 2011-12-18 ENCOUNTER — Other Ambulatory Visit: Payer: Self-pay | Admitting: Internal Medicine

## 2011-12-19 NOTE — Telephone Encounter (Signed)
Pt left v/m has been out of med since 12/15/11; pt request call back when med sent in.

## 2011-12-19 NOTE — Telephone Encounter (Signed)
James Camacho, ok to fill? 

## 2011-12-19 NOTE — Telephone Encounter (Signed)
Electronic refill request

## 2011-12-19 NOTE — Telephone Encounter (Signed)
Pt left v/m out of med since 03/17/11. Pt request call back when med called in.

## 2011-12-20 NOTE — Telephone Encounter (Signed)
Please call in

## 2011-12-20 NOTE — Telephone Encounter (Signed)
Pt called for status of refill;Medication phoned to [facility] as instructed.pt notified done.

## 2011-12-20 NOTE — Telephone Encounter (Signed)
Pt called status of refill. Medication phoned to [facility] as instructed.Pt notified done.

## 2012-01-15 ENCOUNTER — Other Ambulatory Visit: Payer: Self-pay | Admitting: Family Medicine

## 2012-01-16 NOTE — Telephone Encounter (Signed)
Okay #60 x 0 

## 2012-01-16 NOTE — Telephone Encounter (Signed)
rx called into pharmacy

## 2012-01-16 NOTE — Telephone Encounter (Signed)
Received refill request electronically. Last office visit 07/12/11. Is it okay to refill medication?

## 2012-02-02 ENCOUNTER — Other Ambulatory Visit: Payer: Self-pay | Admitting: Internal Medicine

## 2012-02-03 NOTE — Telephone Encounter (Signed)
James Camacho 

## 2012-02-03 NOTE — Telephone Encounter (Signed)
Spoke with patient and advised refill request to early, pt understood.

## 2012-02-03 NOTE — Telephone Encounter (Signed)
Looks to early for refill.. Last one 11/10 correct?

## 2012-02-19 ENCOUNTER — Other Ambulatory Visit: Payer: Self-pay | Admitting: Internal Medicine

## 2012-02-20 ENCOUNTER — Other Ambulatory Visit: Payer: Self-pay | Admitting: Internal Medicine

## 2012-02-20 NOTE — Telephone Encounter (Signed)
Okay #60 x 0 

## 2012-02-20 NOTE — Telephone Encounter (Signed)
rx called into pharmacy

## 2012-02-21 NOTE — Telephone Encounter (Signed)
rx called into pharmacy

## 2012-02-21 NOTE — Telephone Encounter (Signed)
Okay #60 x 0 

## 2012-03-08 ENCOUNTER — Encounter: Payer: BC Managed Care – PPO | Admitting: Internal Medicine

## 2012-03-12 ENCOUNTER — Encounter: Payer: BC Managed Care – PPO | Admitting: Internal Medicine

## 2012-03-18 ENCOUNTER — Other Ambulatory Visit: Payer: Self-pay | Admitting: Internal Medicine

## 2012-03-19 NOTE — Telephone Encounter (Signed)
Okay #60 x 0 Have him reschedule his physical

## 2012-03-19 NOTE — Telephone Encounter (Signed)
rx sent to pharmacy by e-script Patient re-scheduled CPE on 03/26/12

## 2012-03-26 ENCOUNTER — Encounter: Payer: BC Managed Care – PPO | Admitting: Internal Medicine

## 2012-04-02 ENCOUNTER — Encounter: Payer: Self-pay | Admitting: Internal Medicine

## 2012-04-02 ENCOUNTER — Other Ambulatory Visit: Payer: Self-pay | Admitting: Internal Medicine

## 2012-04-02 ENCOUNTER — Ambulatory Visit (INDEPENDENT_AMBULATORY_CARE_PROVIDER_SITE_OTHER): Payer: BC Managed Care – PPO | Admitting: Internal Medicine

## 2012-04-02 VITALS — BP 130/80 | HR 75 | Temp 97.7°F | Ht 68.0 in | Wt 240.0 lb

## 2012-04-02 DIAGNOSIS — Z0000 Encounter for general adult medical examination without abnormal findings: Secondary | ICD-10-CM

## 2012-04-02 DIAGNOSIS — F411 Generalized anxiety disorder: Secondary | ICD-10-CM

## 2012-04-02 DIAGNOSIS — I10 Essential (primary) hypertension: Secondary | ICD-10-CM

## 2012-04-02 DIAGNOSIS — E785 Hyperlipidemia, unspecified: Secondary | ICD-10-CM

## 2012-04-02 DIAGNOSIS — Z1211 Encounter for screening for malignant neoplasm of colon: Secondary | ICD-10-CM

## 2012-04-02 DIAGNOSIS — Z23 Encounter for immunization: Secondary | ICD-10-CM

## 2012-04-02 LAB — BASIC METABOLIC PANEL
BUN: 14 mg/dL (ref 6–23)
CO2: 31 mEq/L (ref 19–32)
Chloride: 101 mEq/L (ref 96–112)
Glucose, Bld: 94 mg/dL (ref 70–99)
Potassium: 3.9 mEq/L (ref 3.5–5.1)

## 2012-04-02 LAB — LIPID PANEL
Cholesterol: 150 mg/dL (ref 0–200)
Triglycerides: 119 mg/dL (ref 0.0–149.0)

## 2012-04-02 LAB — HEPATIC FUNCTION PANEL
AST: 22 U/L (ref 0–37)
Albumin: 4 g/dL (ref 3.5–5.2)

## 2012-04-02 LAB — CBC WITH DIFFERENTIAL/PLATELET
Basophils Absolute: 0 10*3/uL (ref 0.0–0.1)
Eosinophils Absolute: 0.1 10*3/uL (ref 0.0–0.7)
Eosinophils Relative: 0.8 % (ref 0.0–5.0)
HCT: 45.2 % (ref 39.0–52.0)
Lymphs Abs: 2 10*3/uL (ref 0.7–4.0)
MCHC: 33.3 g/dL (ref 30.0–36.0)
MCV: 91.4 fl (ref 78.0–100.0)
Monocytes Absolute: 0.7 10*3/uL (ref 0.1–1.0)
Platelets: 212 10*3/uL (ref 150.0–400.0)
RDW: 13.9 % (ref 11.5–14.6)

## 2012-04-02 LAB — TSH: TSH: 0.92 u[IU]/mL (ref 0.35–5.50)

## 2012-04-02 NOTE — Patient Instructions (Signed)
Please see if you can skip doses of the alprazolam from time to time

## 2012-04-02 NOTE — Assessment & Plan Note (Signed)
BP Readings from Last 3 Encounters:  04/02/12 130/80  07/12/11 140/80  01/11/11 130/71   Acceptable control Due for labs

## 2012-04-02 NOTE — Addendum Note (Signed)
Addended by: Sueanne Margarita on: 04/02/2012 02:49 PM   Modules accepted: Orders

## 2012-04-02 NOTE — Assessment & Plan Note (Signed)
Discussed primary prevention He would like to take statin Will recheck and unless under 100 or close--start atorvastatin 10

## 2012-04-02 NOTE — Assessment & Plan Note (Signed)
Satisfied with the regimen  Still on alprazolam bid and temazepam

## 2012-04-02 NOTE — Progress Notes (Signed)
Subjective:    Patient ID: James Camacho, male    DOB: [birth year], 62 y.o.   MRN: [id number]  HPI Here for physical Has kept weight off----actually weight went back up to 253# and he is now down 2# since last visit Uses protein shakes  Checks BP at home regularly 140-148/75-82 No headaches Has been walking regularly  Having congestion in Eustachian tubes Seen by Dr Willeen Cass Got antibiotic and hydrocodone for pain last week--doing better now  Current Outpatient Prescriptions on File Prior to Visit  Medication Sig Dispense Refill  . ALPRAZolam (XANAX) 0.5 MG tablet TAKE 1 TO 2 TABLETS BY MOUTH 3 TIMES A DAY AS NEEDED  60 tablet  0  . AZOR 10-40 MG per tablet TAKE 1 TABLET BY MOUTH EVERY DAY  90 tablet  3  . buPROPion (WELLBUTRIN XL) 150 MG 24 hr tablet TAKE 1 TABLET BY MOUTH TWICE A DAY  60 tablet  5  . citalopram (CELEXA) 20 MG tablet TAKE 1 TABLET BY MOUTH TWICE A DAY  180 tablet  3  . COREG CR 20 MG 24 hr capsule TAKE ONE CAPSULE BY MOUTH EVERY DAY  30 capsule  11  . quinapril (ACCUPRIL) 40 MG tablet TAKE 1 TABLET (40 MG TOTAL) BY MOUTH AT BEDTIME.  90 tablet  3  . tadalafil (CIALIS) 20 MG tablet 1/2 - 1 tab about 1 hour or more before sex  6 tablet  3  . temazepam (RESTORIL) 15 MG capsule TAKE 1-2 CAPSULE AT BEDTIME  60 capsule  0    Allergies  Allergen Reactions  . Penicillins   . Shellfish Allergy     Past Medical History  Diagnosis Date  . Hypertension   . Anxiety   . ED (erectile dysfunction)     Past Surgical History  Procedure Date  . Appendectomy     with chole  . Cholecystectomy     done by Dr Okey Dupre in 1980's  . Cardiovascular stress test 02/09    negative    Family History  Problem Relation Age of Onset  . Diabetes Mother   . Arthritis Mother   . Hypertension Mother   . Parkinsonism Father   . Heart disease Father   . Cancer Neg Hx     no colon or prostate cancer   Review of Systems  Constitutional: Negative for fatigue and unexpected  weight change.       Wears seat belt  HENT: Positive for congestion, rhinorrhea and tinnitus. Negative for hearing loss and dental problem.        Regular with dentist  Eyes: Negative for visual disturbance.       No diplopia or unilateral vision loss  Respiratory: Negative for cough, chest tightness and shortness of breath.   Cardiovascular: Negative for chest pain, palpitations and leg swelling.       No recent swelling problems  Gastrointestinal: Negative for nausea, vomiting, abdominal pain, constipation and blood in stool.       No heartburn   Genitourinary: Positive for urgency. Negative for frequency and difficulty urinating.       Only occ uses cialis--mostly does okay without it Rare urgency  Musculoskeletal: Negative for back pain, joint swelling and arthralgias.  Skin: Negative for rash.       Chronic calf discoloration   Neurological: Negative for dizziness, syncope, weakness, light-headedness, numbness and headaches.  Hematological: Negative for adenopathy. Does not bruise/bleed easily.  Psychiatric/Behavioral: Negative for sleep disturbance and dysphoric mood. The patient is  nervous/anxious.        Only occasionally has anxiety spells upon awakening Mostly controlled       Objective:   Physical Exam  Constitutional: He is oriented to person, place, and time. He appears well-developed and well-nourished. No distress.  HENT:  Head: Normocephalic and atraumatic.  Right Ear: External ear normal.  Left Ear: External ear normal.  Mouth/Throat: Oropharynx is clear and moist. No oropharyngeal exudate.  Eyes: Conjunctivae normal and EOM are normal. Pupils are equal, round, and reactive to light.  Neck: Normal range of motion. Neck supple. No thyromegaly present.  Cardiovascular: Normal rate, regular rhythm, normal heart sounds and intact distal pulses.  Exam reveals no gallop.   No murmur heard. Pulmonary/Chest: Effort normal and breath sounds normal. No respiratory  distress. He has no wheezes. He has no rales.  Abdominal: Soft. There is no tenderness.  Musculoskeletal: He exhibits no edema and no tenderness.  Lymphadenopathy:    He has no cervical adenopathy.  Neurological: He is alert and oriented to person, place, and time.  Skin: No rash noted. No erythema.  Psychiatric: He has a normal mood and affect. His behavior is normal.          Assessment & Plan:

## 2012-04-02 NOTE — Assessment & Plan Note (Signed)
Needs to continue working on fitness Discussed PSA ---was low so will do every other year Due for colonoscopy--will go to [employer] Flu shot today

## 2012-04-03 ENCOUNTER — Encounter: Payer: Self-pay | Admitting: Internal Medicine

## 2012-04-03 ENCOUNTER — Encounter: Payer: Self-pay | Admitting: *Deleted

## 2012-04-03 NOTE — Telephone Encounter (Signed)
Okay #60 x 0 

## 2012-04-03 NOTE — Telephone Encounter (Signed)
rx called into pharmacy

## 2012-04-21 ENCOUNTER — Other Ambulatory Visit: Payer: Self-pay | Admitting: Internal Medicine

## 2012-04-23 NOTE — Telephone Encounter (Signed)
Okay #60 x 0 

## 2012-04-23 NOTE — Telephone Encounter (Signed)
rx called into pharmacy

## 2012-04-28 ENCOUNTER — Other Ambulatory Visit: Payer: Self-pay | Admitting: Internal Medicine

## 2012-05-06 ENCOUNTER — Other Ambulatory Visit: Payer: Self-pay | Admitting: Internal Medicine

## 2012-05-21 ENCOUNTER — Ambulatory Visit (AMBULATORY_SURGERY_CENTER): Payer: BC Managed Care – PPO | Admitting: *Deleted

## 2012-05-21 ENCOUNTER — Encounter: Payer: Self-pay | Admitting: Internal Medicine

## 2012-05-21 VITALS — Ht 70.0 in | Wt 244.6 lb

## 2012-05-21 DIAGNOSIS — Z1211 Encounter for screening for malignant neoplasm of colon: Secondary | ICD-10-CM

## 2012-05-21 MED ORDER — MOVIPREP 100 G PO SOLR: ORAL | Status: DC

## 2012-05-25 ENCOUNTER — Other Ambulatory Visit: Payer: Self-pay | Admitting: Internal Medicine

## 2012-05-25 NOTE — Telephone Encounter (Signed)
Okay #60 x 0 

## 2012-05-25 NOTE — Telephone Encounter (Signed)
rx called into pharmacy

## 2012-05-28 NOTE — Telephone Encounter (Signed)
Called in 05/25/2012

## 2012-05-29 ENCOUNTER — Telehealth: Payer: Self-pay

## 2012-05-29 NOTE — Telephone Encounter (Signed)
Spoke with patient and advised that per his lab results his cholesterol was good. Pt also wanted to know if he needed to get his testosterone checked because of excessive dry skin. Please Francee Piccolo

## 2012-05-29 NOTE — Telephone Encounter (Signed)
Pt left v/m that when seen 04/02/12 pt thought Dr Alphonsus Sias was going to start pt on cholesterol med as precautionary measure. Pt has not heard back and request call back.

## 2012-05-30 NOTE — Telephone Encounter (Signed)
Please let him know the "bad" cholesterol or LDL was already under 100---at 89---so it is not clear that a statin is appropriate for him

## 2012-05-31 NOTE — Telephone Encounter (Signed)
Spoke with patient and advised results   

## 2012-06-01 ENCOUNTER — Encounter: Payer: Self-pay | Admitting: Internal Medicine

## 2012-06-01 ENCOUNTER — Ambulatory Visit (AMBULATORY_SURGERY_CENTER): Payer: BC Managed Care – PPO | Admitting: Internal Medicine

## 2012-06-01 VITALS — BP 137/89 | HR 62 | Temp 97.1°F | Resp 15 | Ht 70.0 in | Wt 244.0 lb

## 2012-06-01 DIAGNOSIS — D126 Benign neoplasm of colon, unspecified: Secondary | ICD-10-CM

## 2012-06-01 DIAGNOSIS — Z1211 Encounter for screening for malignant neoplasm of colon: Secondary | ICD-10-CM

## 2012-06-01 MED ORDER — SODIUM CHLORIDE 0.9 % IV SOLN: 500.0000 mL | INTRAVENOUS | Status: DC

## 2012-06-01 NOTE — Patient Instructions (Addendum)
YOU HAD AN ENDOSCOPIC PROCEDURE TODAY AT THE [facility]: Refer to the procedure report that was given to you for any specific questions about what was found during the examination.  If the procedure report does not answer your questions, please call your gastroenterologist to clarify.  If you requested that your care partner not be given the details of your procedure findings, then the procedure report has been included in a sealed envelope for you to review at your convenience later.  YOU SHOULD EXPECT: Some feelings of bloating in the abdomen. Passage of more gas than usual.  Walking can help get rid of the air that was put into your GI tract during the procedure and reduce the bloating. If you had a lower endoscopy (such as a colonoscopy or flexible sigmoidoscopy) you may notice spotting of blood in your stool or on the toilet paper. If you underwent a bowel prep for your procedure, then you may not have a normal bowel movement for a few days.  DIET: Your first meal following the procedure should be a light meal and then it is ok to progress to your normal diet.  A half-sandwich or bowl of soup is an example of a good first meal.  Heavy or fried foods are harder to digest and may make you feel nauseous or bloated.  Likewise meals heavy in dairy and vegetables can cause extra gas to form and this can also increase the bloating.  Drink plenty of fluids but you should avoid alcoholic beverages for 24 hours.  ACTIVITY: Your care partner should take you home directly after the procedure.  You should plan to take it easy, moving slowly for the rest of the day.  You can resume normal activity the day after the procedure however you should NOT DRIVE or use heavy machinery for 24 hours (because of the sedation medicines used during the test).    SYMPTOMS TO REPORT IMMEDIATELY: A gastroenterologist can be reached at any hour.  During normal business hours, 8:30 AM to 5:00 PM Monday through Friday,  call [phone].  After hours and on weekends, please call the GI answering service at [phone] who will take a message and have the physician on call contact you.   Following lower endoscopy (colonoscopy or flexible sigmoidoscopy):  Excessive amounts of blood in the stool  Significant tenderness or worsening of abdominal pains  Swelling of the abdomen that is new, acute  Fever of 100F or higher  Following upper endoscopy (EGD)  Vomiting of blood or coffee ground material  New chest pain or pain under the shoulder blades  Painful or persistently difficult swallowing  New shortness of breath  Fever of 100F or higher  Black, tarry-looking stools  FOLLOW UP: If any biopsies were taken you will be contacted by phone or by letter within the next 1-3 weeks.  Call your gastroenterologist if you have not heard about the biopsies in 3 weeks.  Our staff will call the home number listed on your records the next business day following your procedure to check on you and address any questions or concerns that you may have at that time regarding the information given to you following your procedure. This is a courtesy call and so if there is no answer at the home number and we have not heard from you through the emergency physician on call, we will assume that you have returned to your regular daily activities without incident.  SIGNATURES/CONFIDENTIALITY: You and/or your care   partner have signed paperwork which will be entered into your electronic medical record.  These signatures attest to the fact that that the information above on your After Visit Summary has been reviewed and is understood.  Full responsibility of the confidentiality of this discharge information lies with you and/or your care-partner.YOU HAD AN ENDOSCOPIC PROCEDURE TODAY AT [facility]: Refer to the procedure report that was given to you for any specific questions about what was found during the examination.   If the procedure report does not answer your questions, please call your gastroenterologist to clarify.  If you requested that your care partner not be given the details of your procedure findings, then the procedure report has been included in a sealed envelope for you to review at your convenience later.  YOU SHOULD EXPECT: Some feelings of bloating in the abdomen. Passage of more gas than usual.  Walking can help get rid of the air that was put into your GI tract during the procedure and reduce the bloating. If you had a lower endoscopy (such as a colonoscopy or flexible sigmoidoscopy) you may notice spotting of blood in your stool or on the toilet paper. If you underwent a bowel prep for your procedure, then you may not have a normal bowel movement for a few days.  DIET: Your first meal following the procedure should be a light meal and then it is ok to progress to your normal diet.  A half-sandwich or bowl of soup is an example of a good first meal.  Heavy or fried foods are harder to digest and may make you feel nauseous or bloated.  Likewise meals heavy in dairy and vegetables can cause extra gas to form and this can also increase the bloating.  Drink plenty of fluids but you should avoid alcoholic beverages for 24 hours.  ACTIVITY: Your care partner should take you home directly after the procedure.  You should plan to take it easy, moving slowly for the rest of the day.  You can resume normal activity the day after the procedure however you should NOT DRIVE or use heavy machinery for 24 hours (because of the sedation medicines used during the test).    SYMPTOMS TO REPORT IMMEDIATELY: A gastroenterologist can be reached at any hour.  During normal business hours, 8:30 AM to 5:00 PM Monday through Friday, call [phone].  After hours and on weekends, please call the GI answering service at [phone] who will take a message and have the physician on call contact you.   Following lower  endoscopy (colonoscopy or flexible sigmoidoscopy):  Excessive amounts of blood in the stool  Significant tenderness or worsening of abdominal pains  Swelling of the abdomen that is new, acute  Fever of 100F or higher  Following upper endoscopy (EGD)  Vomiting of blood or coffee ground material  New chest pain or pain under the shoulder blades  Painful or persistently difficult swallowing  New shortness of breath  Fever of 100F or higher  Black, tarry-looking stools  FOLLOW UP: If any biopsies were taken you will be contacted by phone or by letter within the next 1-3 weeks.  Call your gastroenterologist if you have not heard about the biopsies in 3 weeks.  Our staff will call the home number listed on your records the next business day following your procedure to check on you and address any questions or concerns that you may have at that time regarding the information given to you  following your procedure. This is a courtesy call and so if there is no answer at the home number and we have not heard from you through the emergency physician on call, we will assume that you have returned to your regular daily activities without incident.  SIGNATURES/CONFIDENTIALITY: You and/or your care partner have signed paperwork which will be entered into your electronic medical record.  These signatures attest to the fact that that the information above on your After Visit Summary has been reviewed and is understood.  Full responsibility of the confidentiality of this discharge information lies with you and/or your care-partner.YOU HAD AN ENDOSCOPIC PROCEDURE TODAY AT THE [facility]: Refer to the procedure report that was given to you for any specific questions about what was found during the examination.  If the procedure report does not answer your questions, please call your gastroenterologist to clarify.  If you requested that your care partner not be given the details of your procedure  findings, then the procedure report has been included in a sealed envelope for you to review at your convenience later.  YOU SHOULD EXPECT: Some feelings of bloating in the abdomen. Passage of more gas than usual.  Walking can help get rid of the air that was put into your GI tract during the procedure and reduce the bloating. If you had a lower endoscopy (such as a colonoscopy or flexible sigmoidoscopy) you may notice spotting of blood in your stool or on the toilet paper. If you underwent a bowel prep for your procedure, then you may not have a normal bowel movement for a few days.  DIET: Your first meal following the procedure should be a light meal and then it is ok to progress to your normal diet.  A half-sandwich or bowl of soup is an example of a good first meal.  Heavy or fried foods are harder to digest and may make you feel nauseous or bloated.  Likewise meals heavy in dairy and vegetables can cause extra gas to form and this can also increase the bloating.  Drink plenty of fluids but you should avoid alcoholic beverages for 24 hours.  ACTIVITY: Your care partner should take you home directly after the procedure.  You should plan to take it easy, moving slowly for the rest of the day.  You can resume normal activity the day after the procedure however you should NOT DRIVE or use heavy machinery for 24 hours (because of the sedation medicines used during the test).    SYMPTOMS TO REPORT IMMEDIATELY: A gastroenterologist can be reached at any hour.  During normal business hours, 8:30 AM to 5:00 PM Monday through Friday, call [phone].  After hours and on weekends, please call the GI answering service at [phone] who will take a message and have the physician on call contact you.   Following lower endoscopy (colonoscopy or flexible sigmoidoscopy):  Excessive amounts of blood in the stool  Significant tenderness or worsening of abdominal pains  Swelling of the abdomen that is new,  acute  Fever of 100F or higher o  FOLLOW UP: If any biopsies were taken you will be contacted by phone or by letter within the next 1-3 weeks.  Call your gastroenterologist if you have not heard about the biopsies in 3 weeks.  Our staff will call the home number listed on your records the next business day following your procedure to check on you and address any questions or concerns that you may have  at that time regarding the information given to you following your procedure. This is a courtesy call and so if there is no answer at the home number and we have not heard from you through the emergency physician on call, we will assume that you have returned to your regular daily activities without incident.  SIGNATURES/CONFIDENTIALITY: You and/or your care partner have signed paperwork which will be entered into your electronic medical record.  These signatures attest to the fact that that the information above on your After Visit Summary has been reviewed and is understood.  Full responsibility of the confidentiality of this discharge information lies with you and/or your care-partner.  Polyp information given.  Dr. Rhea Belton will advise about next colonoscopy after review of your pathology report.

## 2012-06-01 NOTE — Op Note (Addendum)
[facility] [address] [place], [address]   COLONOSCOPY PROCEDURE REPORT  PATIENT: James Camacho, James Camacho  MR#: [id number] BIRTHDATE: 1950/03/16 , 62  yrs. old GENDER: Male ENDOSCOPIST: Beverley Fiedler, MD REFERRED XB:[id number], Richard PROCEDURE DATE:  06/01/2012 PROCEDURE:   Colonoscopy with snare polypectomy ASA CLASS:   Class II INDICATIONS:average risk screening and Last colonoscopy performed 10 years ago. MEDICATIONS: MAC sedation, administered by CRNA and propofol (Diprivan) 200mg  IV  DESCRIPTION OF PROCEDURE:   After the risks benefits and alternatives of the procedure were thoroughly explained, informed consent was obtained.  A digital rectal exam revealed no rectal mass.   The LB [id number]  endoscope was introduced through the anus and advanced to the cecum, which was identified by both the appendix and ileocecal valve. No adverse events experienced. The quality of the prep was Moviprep fair  The instrument was then slowly withdrawn as the colon was fully examined.    COLON FINDINGS: Two sessile polyps measuring 5 mm in size were found in the transverse colon and rectum.  Polypectomy was performed using cold snare.  All resections were complete and all polyp tissue was completely retrieved.  A 1 cm lipoma (with pillow sign) was seen at the hepatic flexure.  The colon mucosa was otherwise normal.  Retroflexed views revealed no abnormalities. The time to cecum=3 minutes 19 seconds.  Withdrawal time=14 minutes 49 seconds. The scope was withdrawn and the procedure completed. COMPLICATIONS: There were no complications.  ENDOSCOPIC IMPRESSION: 1.   Two sessile polyps measuring 5 mm in size were found in the transverse colon and rectum; Polypectomy was performed using cold snare 2.   Colonic lipoma 2.   The colon mucosa was otherwise normal  RECOMMENDATIONS: 1.  Await pathology results 2.  If the polyps removed today are proven to be  adenomatous (pre-cancerous) polyps, you will need a repeat colonoscopy in 5 years.  Otherwise you should continue to follow colorectal cancer screening guidelines for "routine risk" patients with colonoscopy in 10 years.  You will receive a letter within 1-2 weeks with the results of your biopsy as well as final recommendations.  Please call my office if you have not received a letter after 3 weeks.   eSigned:  Beverley Fiedler, MD 06/01/2012 8:33 AM Revised: 06/01/2012 8:33 AM cc: The Patient

## 2012-06-01 NOTE — Progress Notes (Signed)
Patient did not experience any of the following events: a burn prior to discharge; a fall within the facility; wrong site/side/patient/procedure/implant event; or a hospital transfer or hospital admission upon discharge from the facility. (G8907) Patient did not have preoperative order for IV antibiotic SSI prophylaxis. (G8918)  

## 2012-06-01 NOTE — Progress Notes (Signed)
Called to room to assist during endoscopic procedure.  Patient ID and intended procedure confirmed with present staff. Received instructions for my participation in the procedure from the performing physician.  

## 2012-06-04 ENCOUNTER — Telehealth: Payer: Self-pay

## 2012-06-04 NOTE — Telephone Encounter (Signed)
  Follow up Call-  Call back number 06/01/2012  Post procedure Call Back phone  # [phone]  Permission to leave phone message Yes     Patient questions:  Do you have a fever, pain , or abdominal swelling? no Pain Score  0 *  Have you tolerated food without any problems? yes  Have you been able to return to your normal activities? yes  Do you have any questions about your discharge instructions: Diet   no Medications  no Follow up visit  no  Do you have questions or concerns about your Care? no  Actions: * If pain score is 4 or above: No action needed, pain <4.

## 2012-06-05 ENCOUNTER — Encounter: Payer: Self-pay | Admitting: Internal Medicine

## 2012-07-01 ENCOUNTER — Other Ambulatory Visit: Payer: Self-pay | Admitting: Internal Medicine

## 2012-07-02 ENCOUNTER — Other Ambulatory Visit: Payer: Self-pay | Admitting: Internal Medicine

## 2012-07-02 NOTE — Telephone Encounter (Signed)
rx called into pharmacy

## 2012-07-02 NOTE — Telephone Encounter (Signed)
Okay #60 x 0 

## 2012-07-05 ENCOUNTER — Other Ambulatory Visit: Payer: Self-pay | Admitting: Internal Medicine

## 2012-07-05 NOTE — Telephone Encounter (Signed)
Okay #60 x 0 

## 2012-07-10 ENCOUNTER — Other Ambulatory Visit: Payer: Self-pay | Admitting: Internal Medicine

## 2012-07-11 NOTE — Telephone Encounter (Signed)
Okay #6 x 11

## 2012-07-11 NOTE — Telephone Encounter (Signed)
rx sent to pharmacy by e-script  

## 2012-07-26 ENCOUNTER — Other Ambulatory Visit: Payer: Self-pay | Admitting: Internal Medicine

## 2012-08-01 ENCOUNTER — Telehealth: Payer: Self-pay

## 2012-08-01 NOTE — Telephone Encounter (Signed)
Spoke with patient and advised results, he will wait for the results

## 2012-08-01 NOTE — Telephone Encounter (Signed)
Please call him It is unlikely that he can get TB from someone who is dead (usually from airborne dissemination with coughing) Should await the medical examiner's report

## 2012-08-01 NOTE — Telephone Encounter (Signed)
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 The Health Department contacted the [place] [facility] and advised there was also concern that pt had TB and the Medical Examiner in [place] has ordered a autospy. Pt is concerned about possible TB exposure. RN reached See within 24 hours for possible TB exposure and immunocompromised ---63 years of age per Tuberculosis, Diagnosed or Suspected Exposure protocol , RN discussed care advice along with education about TB per [facility] website. Pt wants to wait to hear from the Medical Examiner to see if the deceased pt indeed have TB and then he will contact the office. Protocol(s) Used: Tuberculosis, Diagnosed or Suspected Exposure Recommended Outcome per Protocol: See Provider within 24 hours Reason for Outcome: Known or possible TB exposure AND immunocompromised Care Advice: ~ Call provider for appointment. Alert provider to possibility of active TB so that everyone in the office, emergency department, or hospital can avoid exposure. ~ ~ Call provider if symptoms develop. ~ All household members and close/intimate contacts should have a TB skin test, if positive further tests may be done.

## 2012-08-08 ENCOUNTER — Other Ambulatory Visit: Payer: Self-pay | Admitting: Internal Medicine

## 2012-08-09 NOTE — Telephone Encounter (Signed)
Okay #60 x 0 

## 2012-08-09 NOTE — Telephone Encounter (Signed)
rx called into pharmacy

## 2012-08-30 ENCOUNTER — Other Ambulatory Visit: Payer: Self-pay | Admitting: Internal Medicine

## 2012-08-30 MED ORDER — BUPROPION HCL ER (XL) 150 MG PO TB24: ORAL_TABLET | ORAL | Status: DC

## 2012-08-30 NOTE — Telephone Encounter (Signed)
Okay to fill for a year 

## 2012-08-30 NOTE — Telephone Encounter (Signed)
Fax request to change pt's Rx to a 90 day supply

## 2012-08-30 NOTE — Telephone Encounter (Signed)
rx sent to pharmacy by e-script  

## 2012-08-31 ENCOUNTER — Other Ambulatory Visit: Payer: Self-pay | Admitting: *Deleted

## 2012-08-31 MED ORDER — BUPROPION HCL ER (XL) 150 MG PO TB24: ORAL_TABLET | ORAL | Status: DC

## 2012-09-17 ENCOUNTER — Other Ambulatory Visit: Payer: Self-pay | Admitting: Internal Medicine

## 2012-09-18 NOTE — Telephone Encounter (Signed)
rx called into pharmacy

## 2012-09-18 NOTE — Telephone Encounter (Signed)
Okay #60 x 0  He is due for follow up---have him schedule

## 2012-10-25 ENCOUNTER — Other Ambulatory Visit: Payer: Self-pay | Admitting: Internal Medicine

## 2012-11-18 ENCOUNTER — Other Ambulatory Visit: Payer: Self-pay | Admitting: Internal Medicine

## 2012-11-19 NOTE — Telephone Encounter (Signed)
Okay #60 x 0  He was due for follow up If he is doing fine, just have him set up PE Due 04/03/13 or soon after

## 2012-11-19 NOTE — Telephone Encounter (Signed)
rx called into pharmacy .left message to have patient return my call.  

## 2013-01-07 ENCOUNTER — Other Ambulatory Visit: Payer: Self-pay | Admitting: Internal Medicine

## 2013-01-07 NOTE — Telephone Encounter (Signed)
Okay #60 x 0 

## 2013-01-07 NOTE — Telephone Encounter (Signed)
Last filled 11/19/12

## 2013-01-07 NOTE — Telephone Encounter (Signed)
rx called into pharmacy

## 2013-01-26 ENCOUNTER — Other Ambulatory Visit: Payer: Self-pay | Admitting: Internal Medicine

## 2013-02-07 NOTE — Telephone Encounter (Signed)
Pt request status of refill Azor; spoke with Maisie Fus at [facility] and did not receive electronic refill on 01/26/13.Medication phoned to pharmacy as instructed. Pt notified done.

## 2013-02-08 ENCOUNTER — Telehealth: Payer: Self-pay

## 2013-02-08 NOTE — Telephone Encounter (Signed)
Pt request prior auth for Azor; pt said [facility] sent PA request for [employer].

## 2013-02-12 MED ORDER — AMLODIPINE BESYLATE 10 MG PO TABS: 10.0000 mg | ORAL_TABLET | Freq: Every day | ORAL | Status: DC

## 2013-02-12 MED ORDER — LOSARTAN POTASSIUM 100 MG PO TABS: 100.0000 mg | ORAL_TABLET | Freq: Every day | ORAL | Status: DC

## 2013-02-12 NOTE — Telephone Encounter (Signed)
I would recommend a change to amlodipine 10mg  daily and losartan 100mg  daily. These are generic and would be about the same as the expensive azor If he is okay with this, send Rx for a year

## 2013-02-12 NOTE — Telephone Encounter (Signed)
Spoke with patient and advised results rx sent to pharmacy by e-script  

## 2013-02-12 NOTE — Telephone Encounter (Signed)
Prior auth paperwork received. Spoke with pt and he is ok with having a generic equivalent called in for him if needed. Prior auth paperwork is on your desk.

## 2013-02-24 ENCOUNTER — Other Ambulatory Visit: Payer: Self-pay | Admitting: Internal Medicine

## 2013-02-25 NOTE — Telephone Encounter (Signed)
Last filled 01/07/13

## 2013-02-25 NOTE — Telephone Encounter (Signed)
Spoke with patient and advised results CPE scheduled rx called into pharmacy

## 2013-02-25 NOTE — Telephone Encounter (Signed)
Okay #60 x 0 This is the last refill unless he schedules appt---he is overdue and really needs a PE

## 2013-04-15 ENCOUNTER — Other Ambulatory Visit: Payer: Self-pay | Admitting: Internal Medicine

## 2013-04-16 NOTE — Telephone Encounter (Signed)
Okay #60 x 0 

## 2013-04-16 NOTE — Telephone Encounter (Signed)
rx called into pharmacy

## 2013-04-18 ENCOUNTER — Encounter: Payer: BC Managed Care – PPO | Admitting: Internal Medicine

## 2013-04-24 ENCOUNTER — Ambulatory Visit (INDEPENDENT_AMBULATORY_CARE_PROVIDER_SITE_OTHER): Payer: BC Managed Care – PPO | Admitting: Internal Medicine

## 2013-04-24 ENCOUNTER — Encounter: Payer: Self-pay | Admitting: Internal Medicine

## 2013-04-24 VITALS — BP 140/80 | HR 78 | Wt 253.0 lb

## 2013-04-24 DIAGNOSIS — F411 Generalized anxiety disorder: Secondary | ICD-10-CM

## 2013-04-24 DIAGNOSIS — I10 Essential (primary) hypertension: Secondary | ICD-10-CM

## 2013-04-24 DIAGNOSIS — N529 Male erectile dysfunction, unspecified: Secondary | ICD-10-CM

## 2013-04-24 DIAGNOSIS — G479 Sleep disorder, unspecified: Secondary | ICD-10-CM

## 2013-04-24 MED ORDER — LOSARTAN POTASSIUM-HCTZ 100-25 MG PO TABS: 1.0000 | ORAL_TABLET | Freq: Every day | ORAL | Status: DC

## 2013-04-24 MED ORDER — CARVEDILOL 12.5 MG PO TABS: 12.5000 mg | ORAL_TABLET | Freq: Two times a day (BID) | ORAL | Status: DC

## 2013-04-24 MED ORDER — TADALAFIL 20 MG PO TABS: 20.0000 mg | ORAL_TABLET | Freq: Every day | ORAL | Status: DC | PRN

## 2013-04-24 NOTE — Assessment & Plan Note (Signed)
Satisfied with the cialis

## 2013-04-24 NOTE — Progress Notes (Signed)
Subjective:    Patient ID: James Camacho, male    DOB: March 31, 1950, 63 y.o.   MRN: [id number]  HPI Doing fine Did gain back the weight-- "it is a yo-yo"  Got compression socks due to bad ankle swelling---especially right This helps No pain No ulcers  Rarely checks BP Usually fine Tries to go to gym weekly  No chest pain No SOB Stable exercise tolerance  Anxiety has been fine Continues on his regimen Sometimes mornings are tough-- might be related to work Has tried to cut back on xanax  Still uses the temazepam every night  Current Outpatient Prescriptions on File Prior to Visit  Medication Sig Dispense Refill  . ALPRAZolam (XANAX) 0.5 MG tablet TAKE 1 TO 2 TABLETS BY MOUTH 3 TIMES A DAY AS NEEDED  60 tablet  0  . amLODipine (NORVASC) 10 MG tablet Take 1 tablet (10 mg total) by mouth daily.  90 tablet  3  . buPROPion (WELLBUTRIN XL) 150 MG 24 hr tablet Take 1 tablet by mouth twice daily  180 tablet  3  . CIALIS 20 MG tablet TAKE 1/2 - 1 TAB ABOUT 1 HOUR OR MORE BEFORE SEX  6 tablet  11  . citalopram (CELEXA) 20 MG tablet TAKE 1 TABLET BY MOUTH TWICE A DAY  180 tablet  3  . COREG CR 20 MG 24 hr capsule TAKE ONE CAPSULE BY MOUTH EVERY DAY  30 capsule  11  . losartan (COZAAR) 100 MG tablet Take 1 tablet (100 mg total) by mouth daily.  90 tablet  3  . quinapril (ACCUPRIL) 40 MG tablet TAKE 1 TABLET (40 MG TOTAL) BY MOUTH AT BEDTIME.  90 tablet  3  . temazepam (RESTORIL) 15 MG capsule TAKE 1 TO 2 CAPSULES BY MOUTH AT BEDTIME  60 capsule  0   No current facility-administered medications on file prior to visit.    Allergies  Allergen Reactions  . Penicillins Rash  . Shellfish Allergy Hives    Past Medical History  Diagnosis Date  . Hypertension   . Anxiety   . ED (erectile dysfunction)   . Hyperlipidemia     LDL in 120's  . H/O seasonal allergies   . Depression     Past Surgical History  Procedure Laterality Date  . Appendectomy      with chole  .  Cholecystectomy      done by Dr Sharlet Salina in 1980's  . Cardiovascular stress test  02/09    negative    Family History  Problem Relation Age of Onset  . Diabetes Mother   . Arthritis Mother   . Hypertension Mother   . Parkinsonism Father   . Heart disease Father   . Cancer Neg Hx     no colon or prostate cancer  . Colon cancer Neg Hx   . Esophageal cancer Neg Hx   . Stomach cancer Neg Hx   . Rectal cancer Neg Hx     History   Social History  . Marital Status: Married    Spouse Name: N/A    Number of Children: 3  . Years of Education: N/A   Occupational History  . 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 Smoking status: Never Smoker   . Smokeless tobacco: Never Used  . Alcohol Use: Yes     Comment: rare  . Drug Use: No  . Sexual Activity: Not on file   Other Topics  Concern  . Not on file   Social History Narrative   Tries to walk   Review of Systems Needs to change the coreg---too expensive Still uses the cialis    Objective:   Physical Exam  Constitutional: He appears well-developed and well-nourished. No distress.  Neck: Normal range of motion. Neck supple. No thyromegaly present.  Cardiovascular: Normal rate, regular rhythm, normal heart sounds and intact distal pulses.  Exam reveals no gallop.   No murmur heard. Pulmonary/Chest: Effort normal and breath sounds normal. No respiratory distress. He has no wheezes. He has no rales.  Musculoskeletal: He exhibits no tenderness.  Thick calves without pitting  Lymphadenopathy:    He has no cervical adenopathy.  Psychiatric: He has a normal mood and affect. His behavior is normal.          Assessment & Plan:

## 2013-04-24 NOTE — Assessment & Plan Note (Signed)
Controlled on his regimen

## 2013-04-24 NOTE — Assessment & Plan Note (Signed)
Temazepam is effective Will continue

## 2013-04-24 NOTE — Progress Notes (Signed)
Pre-visit discussion using our clinic review tool. No additional management support is needed unless otherwise documented below in the visit note.  

## 2013-04-24 NOTE — Assessment & Plan Note (Signed)
BP Readings from Last 3 Encounters:  04/24/13 140/80  06/01/12 137/89  04/02/12 130/80   Has had some edema--may be related to amlodipine Also, complicated regimen with ACEI and ARB Will stop the ACEI and add HCTZ

## 2013-04-25 ENCOUNTER — Telehealth: Payer: Self-pay | Admitting: Internal Medicine

## 2013-04-25 NOTE — Telephone Encounter (Signed)
Relevant patient education assigned to patient using Emmi. ° °

## 2013-05-04 ENCOUNTER — Other Ambulatory Visit: Payer: Self-pay | Admitting: Internal Medicine

## 2013-05-07 ENCOUNTER — Telehealth: Payer: Self-pay

## 2013-05-07 NOTE — Telephone Encounter (Signed)
Prior James Camacho has been sent through [email or web address]

## 2013-06-14 ENCOUNTER — Other Ambulatory Visit: Payer: Self-pay | Admitting: Internal Medicine

## 2013-06-14 NOTE — Telephone Encounter (Signed)
rx called into pharmacy

## 2013-06-14 NOTE — Telephone Encounter (Signed)
Okay #60 x 0 

## 2013-06-14 NOTE — Telephone Encounter (Signed)
04/15/13 

## 2013-07-22 ENCOUNTER — Other Ambulatory Visit: Payer: Self-pay | Admitting: Internal Medicine

## 2013-07-31 ENCOUNTER — Encounter: Payer: Self-pay | Admitting: *Deleted

## 2013-07-31 ENCOUNTER — Encounter: Payer: Self-pay | Admitting: Internal Medicine

## 2013-07-31 ENCOUNTER — Ambulatory Visit (INDEPENDENT_AMBULATORY_CARE_PROVIDER_SITE_OTHER): Payer: BC Managed Care – PPO | Admitting: Internal Medicine

## 2013-07-31 VITALS — BP 128/80 | HR 88 | Temp 98.4°F | Ht 70.0 in | Wt 254.0 lb

## 2013-07-31 DIAGNOSIS — I10 Essential (primary) hypertension: Secondary | ICD-10-CM

## 2013-07-31 DIAGNOSIS — Z0000 Encounter for general adult medical examination without abnormal findings: Secondary | ICD-10-CM

## 2013-07-31 DIAGNOSIS — G479 Sleep disorder, unspecified: Secondary | ICD-10-CM

## 2013-07-31 DIAGNOSIS — F411 Generalized anxiety disorder: Secondary | ICD-10-CM

## 2013-07-31 DIAGNOSIS — N529 Male erectile dysfunction, unspecified: Secondary | ICD-10-CM

## 2013-07-31 DIAGNOSIS — Z125 Encounter for screening for malignant neoplasm of prostate: Secondary | ICD-10-CM

## 2013-07-31 LAB — CBC WITH DIFFERENTIAL/PLATELET
BASOS PCT: 0.4 % (ref 0.0–3.0)
Basophils Absolute: 0 10*3/uL (ref 0.0–0.1)
EOS PCT: 0.8 % (ref 0.0–5.0)
Eosinophils Absolute: 0.1 10*3/uL (ref 0.0–0.7)
HCT: 44 % (ref 39.0–52.0)
Hemoglobin: 14.7 g/dL (ref 13.0–17.0)
LYMPHS PCT: 23 % (ref 12.0–46.0)
Lymphs Abs: 1.6 10*3/uL (ref 0.7–4.0)
MCHC: 33.5 g/dL (ref 30.0–36.0)
MCV: 91.9 fl (ref 78.0–100.0)
Monocytes Absolute: 0.7 10*3/uL (ref 0.1–1.0)
Monocytes Relative: 9.9 % (ref 3.0–12.0)
NEUTROS PCT: 65.9 % (ref 43.0–77.0)
Neutro Abs: 4.6 10*3/uL (ref 1.4–7.7)
Platelets: 228 10*3/uL (ref 150.0–400.0)
RBC: 4.78 Mil/uL (ref 4.22–5.81)
RDW: 13.8 % (ref 11.5–15.5)
WBC: 7 10*3/uL (ref 4.0–10.5)

## 2013-07-31 LAB — COMPREHENSIVE METABOLIC PANEL
ALBUMIN: 3.8 g/dL (ref 3.5–5.2)
ALK PHOS: 39 U/L (ref 39–117)
ALT: 45 U/L (ref 0–53)
AST: 28 U/L (ref 0–37)
BUN: 10 mg/dL (ref 6–23)
CALCIUM: 9.1 mg/dL (ref 8.4–10.5)
CO2: 32 mEq/L (ref 19–32)
Chloride: 101 mEq/L (ref 96–112)
Creatinine, Ser: 0.9 mg/dL (ref 0.4–1.5)
GFR: 87.73 mL/min (ref >60.00)
GLUCOSE: 102 mg/dL — AB (ref 70–99)
POTASSIUM: 3.8 meq/L (ref 3.5–5.1)
Sodium: 139 mEq/L (ref 135–145)
Total Bilirubin: 0.9 mg/dL (ref 0.2–1.2)
Total Protein: 6.8 g/dL (ref 6.0–8.3)

## 2013-07-31 LAB — T4, FREE: Free T4: 0.78 ng/dL (ref 0.60–1.60)

## 2013-07-31 LAB — PSA: PSA: 0.75 ng/mL (ref 0.10–4.00)

## 2013-07-31 LAB — TSH: TSH: 1.46 u[IU]/mL (ref 0.35–4.50)

## 2013-07-31 MED ORDER — ZOSTER VACCINE LIVE 19400 UNT/0.65ML ~~LOC~~ SOLR: 0.6500 mL | Freq: Once | SUBCUTANEOUS | Status: DC

## 2013-07-31 MED ORDER — LOSARTAN POTASSIUM 100 MG PO TABS: 100.0000 mg | ORAL_TABLET | Freq: Every day | ORAL | Status: DC

## 2013-07-31 MED ORDER — FUROSEMIDE 40 MG PO TABS: 40.0000 mg | ORAL_TABLET | Freq: Every day | ORAL | Status: DC

## 2013-07-31 NOTE — Progress Notes (Signed)
Subjective:    Patient ID: James Camacho, male    DOB: 1951/01/16, 63 y.o.   MRN: [id number]  HPI Here for physical Now notices bad breath--he thinks it is related to the HCTZ Strong musty smell that others notice--has to gargle regularly Will adjust meds again Notes some edema since the changes were made  Doing okay on anxiety regimen Uses the alprazolam in the morning--generally doesn't need again Does use the temazepam most nights for sleep  Current Outpatient Prescriptions on File Prior to Visit  Medication Sig Dispense Refill  . ALPRAZolam (XANAX) 0.5 MG tablet TAKE 1 TO 2 TABLETS BY MOUTH 3 TIMES A DAY AS NEEDED  60 tablet  0  . amLODipine (NORVASC) 10 MG tablet Take 1 tablet (10 mg total) by mouth daily.  90 tablet  3  . buPROPion (WELLBUTRIN XL) 150 MG 24 hr tablet Take 1 tablet by mouth twice daily  180 tablet  3  . carvedilol (COREG) 12.5 MG tablet Take 1 tablet (12.5 mg total) by mouth 2 (two) times daily with a meal.  180 tablet  3  . citalopram (CELEXA) 20 MG tablet TAKE 1 TABLET BY MOUTH TWICE A DAY  180 tablet  3  . tadalafil (CIALIS) 20 MG tablet Take 1 tablet (20 mg total) by mouth daily as needed for erectile dysfunction.  4 tablet  11  . temazepam (RESTORIL) 15 MG capsule TAKE 1 TO 2 CAPSULES BY MOUTH AT BEDTIME  60 capsule  0   No current facility-administered medications on file prior to visit.    Allergies  Allergen Reactions  . Penicillins Rash  . Shellfish Allergy Hives    Past Medical History  Diagnosis Date  . Hypertension   . Anxiety   . ED (erectile dysfunction)   . Hyperlipidemia     LDL in 120's  . H/O seasonal allergies   . Depression     Past Surgical History  Procedure Laterality Date  . Appendectomy      with chole  . Cholecystectomy      done by Dr Sharlet Salina in 1980's  . Cardiovascular stress test  02/09    negative    Family History  Problem Relation Age of Onset  . Diabetes Mother   . Arthritis Mother   .  Hypertension Mother   . Parkinsonism Father   . Heart disease Father   . Cancer Neg Hx     no colon or prostate cancer  . Colon cancer Neg Hx   . Esophageal cancer Neg Hx   . Stomach cancer Neg Hx   . Rectal cancer Neg Hx     History   Social History  . Marital Status: Married    Spouse Name: N/A    Number of Children: 3  . Years of Education: N/A   Occupational History  . 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 Smoking status: Never Smoker   . Smokeless tobacco: Never Used  . Alcohol Use: Yes     Comment: rare  . Drug Use: No  . Sexual Activity: Not on file   Other Topics Concern  . Not on file   Social History Narrative   Tries to walk   Review of Systems  Constitutional: Positive for unexpected weight change. Negative for fatigue.       Weight up 10# in past year Wears seat belt  HENT: Positive for congestion, hearing loss and rhinorrhea. Negative  for tinnitus.        Wears hearing aides Regular with dentist  Eyes: Negative for visual disturbance.       No diplopia or unilateral vision loss  Respiratory: Negative for cough, chest tightness and shortness of breath.   Cardiovascular: Positive for leg swelling. Negative for chest pain and palpitations.  Gastrointestinal: Negative for nausea, vomiting, abdominal pain, constipation and blood in stool.       No heartburn  Endocrine: Negative for cold intolerance and heat intolerance.  Genitourinary: Positive for frequency and difficulty urinating.       Slight dribbling---has good stream Nocturia x 1-2 Still satisfied with cialis--uses rarely  Musculoskeletal: Positive for arthralgias. Negative for back pain and joint swelling.       Right hip and leg hurt--tends to be later in the day Infrequent to gym-- tries to walk  Skin: Positive for rash.       Saw derm for psoriasis---just uses spray and OTC cream  Allergic/Immunologic: Positive for environmental allergies.  Negative for immunocompromised state.       Some right eye drainage--?allergies. Using OTC drops  Neurological: Negative for dizziness, syncope, weakness, light-headedness, numbness and headaches.  Hematological: Negative for adenopathy. Does not bruise/bleed easily.  Psychiatric/Behavioral: Positive for sleep disturbance. Negative for dysphoric mood. The patient is nervous/anxious.        Anxiety controlled       Objective:   Physical Exam  Constitutional: He is oriented to person, place, and time. He appears well-developed. No distress.  HENT:  Head: Normocephalic and atraumatic.  Right Ear: External ear normal.  Left Ear: External ear normal.  Mouth/Throat: Oropharynx is clear and moist. No oropharyngeal exudate.  Eyes: Conjunctivae and EOM are normal. Pupils are equal, round, and reactive to light.  Neck: Normal range of motion. Neck supple. No thyromegaly present.  Cardiovascular: Normal rate, regular rhythm, normal heart sounds and intact distal pulses.  Exam reveals no gallop.   No murmur heard. Pulmonary/Chest: Effort normal and breath sounds normal. No respiratory distress. He has no wheezes. He has no rales.  Abdominal: Soft. There is no tenderness.  Musculoskeletal: He exhibits no edema and no tenderness.  Lymphadenopathy:    He has no cervical adenopathy.  Neurological: He is alert and oriented to person, place, and time.  Skin: No rash noted. No erythema.  Psychiatric: He has a normal mood and affect. His behavior is normal.          Assessment & Plan:

## 2013-07-31 NOTE — Progress Notes (Signed)
Pre visit review using our clinic review tool, if applicable. No additional management support is needed unless otherwise documented below in the visit note. 

## 2013-07-31 NOTE — Assessment & Plan Note (Signed)
Healthy but very out of shape Discussed lifestyle Will give Rx for zostavax Check PSA after discussion

## 2013-07-31 NOTE — Patient Instructions (Addendum)
Please review your current medicine list with your pharmacist to be sure they have it correct. Please set up blood work in about a month (renal-- 401.9)  DASH Diet The DASH diet stands for "Dietary Approaches to Stop Hypertension." It is a healthy eating plan that has been shown to reduce high blood pressure (hypertension) in as little as 14 days, while also possibly providing other significant health benefits. These other health benefits include reducing the risk of breast cancer after menopause and reducing the risk of type 2 diabetes, heart disease, colon cancer, and stroke. Health benefits also include weight loss and slowing kidney failure in patients with chronic kidney disease.  DIET GUIDELINES  Limit salt (sodium). Your diet should contain less than 1500 mg of sodium daily.  Limit refined or processed carbohydrates. Your diet should include mostly whole grains. Desserts and added sugars should be used sparingly.  Include small amounts of heart-healthy fats. These types of fats include nuts, oils, and tub margarine. Limit saturated and trans fats. These fats have been shown to be harmful in the body. CHOOSING FOODS  The following food groups are based on a 2000 calorie diet. See your Registered Dietitian for individual calorie needs. Grains and Grain Products (6 to 8 servings daily)  Eat More Often: Whole-wheat bread, brown rice, whole-grain or wheat pasta, quinoa, popcorn without added fat or salt (air popped).  Eat Less Often: White bread, white pasta, white rice, cornbread. Vegetables (4 to 5 servings daily)  Eat More Often: Fresh, frozen, and canned vegetables. Vegetables may be raw, steamed, roasted, or grilled with a minimal amount of fat.  Eat Less Often/Avoid: Creamed or fried vegetables. Vegetables in a cheese sauce. Fruit (4 to 5 servings daily)  Eat More Often: All fresh, canned (in natural juice), or frozen fruits. Dried fruits without added sugar. One hundred percent  fruit juice ( cup [237 mL] daily).  Eat Less Often: Dried fruits with added sugar. Canned fruit in light or heavy syrup. [employer], Fish, and Poultry (2 servings or less daily. One serving is 3 to 4 oz [85-114 g]).  Eat More Often: Ninety percent or leaner ground beef, tenderloin, sirloin. Round cuts of beef, chicken breast, [place] breast. All fish. Grill, bake, or broil your meat. Nothing should be fried.  Eat Less Often/Avoid: Fatty cuts of meat, [place], or chicken leg, thigh, or wing. Fried cuts of meat or fish. Dairy (2 to 3 servings)  Eat More Often: Low-fat or fat-free milk, low-fat plain or light yogurt, reduced-fat or part-skim cheese.  Eat Less Often/Avoid: Milk (whole, 2%).Whole milk yogurt. Full-fat cheeses. Nuts, Seeds, and Legumes (4 to 5 servings per week)  Eat More Often: All without added salt.  Eat Less Often/Avoid: Salted nuts and seeds, canned beans with added salt. Fats and Sweets (limited)  Eat More Often: Vegetable oils, tub margarines without trans fats, sugar-free gelatin. Mayonnaise and salad dressings.  Eat Less Often/Avoid: Coconut oils, palm oils, butter, stick margarine, cream, half and half, cookies, candy, pie. FOR MORE INFORMATION The Dash Diet Eating Plan: www.dashdiet.org Document Released: 02/10/2011 Document Revised: 05/16/2011 Document Reviewed: 02/10/2011 [facility] Patient Information 2014 [place], [place].

## 2013-07-31 NOTE — Assessment & Plan Note (Signed)
Satisfied with the temazepam

## 2013-07-31 NOTE — Assessment & Plan Note (Signed)
BP Readings from Last 3 Encounters:  07/31/13 128/80  04/24/13 140/80  06/01/12 137/89   Good control Will adjust the regimen to see if we can get rid of the halitosis

## 2013-07-31 NOTE — Assessment & Plan Note (Signed)
cialis helps but very expensive

## 2013-07-31 NOTE — Assessment & Plan Note (Signed)
Doing fine on the regimen No changes

## 2013-08-06 ENCOUNTER — Encounter: Payer: Self-pay | Admitting: *Deleted

## 2013-08-08 ENCOUNTER — Telehealth: Payer: Self-pay

## 2013-08-08 NOTE — Telephone Encounter (Signed)
Pt left v/m; pt was seen on 07/31/13 and was started on Lasix 40 mg taking one daily. Pt has 4.5 lb weight gain since 08/05/13. Pt thinks wt gain is related to increase in fluid or swelling. Pt said ankles are less swollen but but hands are puffy. Pt drinks a lot of water. Pt also feels sluggish and not sure why. Pt wanted to know if needs to increase Lasix or what to do. Pt request cb.[facility].

## 2013-08-08 NOTE — Telephone Encounter (Signed)
He might need a higher dose of the furosemide if the HCTZ worked better for his fluid Ask if the bad breath is any better  He can try to take 2 of the furosemide to see if that works better If the bad breath is not any better-we may want to restart the HCTZ

## 2013-08-09 NOTE — Telephone Encounter (Signed)
Spoke with patient and advised results He will call back if anything changes

## 2013-08-15 ENCOUNTER — Other Ambulatory Visit: Payer: Self-pay | Admitting: Internal Medicine

## 2013-08-15 NOTE — Telephone Encounter (Signed)
Last filled 06-14-13 [place] PATIENT, Please send back to me for call in

## 2013-08-16 NOTE — Telephone Encounter (Signed)
Ok to phone in xanax 

## 2013-08-16 NOTE — Telephone Encounter (Signed)
Phoned in to pharmacy. 

## 2013-08-19 ENCOUNTER — Encounter: Payer: Self-pay | Admitting: Internal Medicine

## 2013-08-28 MED ORDER — FUROSEMIDE 40 MG PO TABS: 40.0000 mg | ORAL_TABLET | Freq: Two times a day (BID) | ORAL | Status: DC

## 2013-08-28 NOTE — Telephone Encounter (Signed)
Okay to change furosemide to bid and send Rx for a year

## 2013-08-28 NOTE — Telephone Encounter (Signed)
Spoke with patient and advised results rx sent to pharmacy by e-script  

## 2013-08-28 NOTE — Telephone Encounter (Signed)
Pt said since stopping HCTZ no complaint of bad breath; pt has been taking furosemide 40 mg one tab in morning and one tab in afternoon; swelling is much improved in ankles and pt request [facility] rx [facility] to [facility] for furosemide 40 mg with new instructions and quantity # 60. Pt request cb.Please advise.

## 2013-08-28 NOTE — Addendum Note (Signed)
Addended by: Despina Hidden on: 08/28/2013 04:00 PM   Modules accepted: Orders, Medications

## 2013-09-30 ENCOUNTER — Other Ambulatory Visit: Payer: Self-pay | Admitting: Internal Medicine

## 2013-09-30 NOTE — Telephone Encounter (Signed)
08/16/13 

## 2013-10-01 NOTE — Telephone Encounter (Signed)
rx called into pharmacy

## 2013-10-01 NOTE — Telephone Encounter (Signed)
Okay #60 x 0 

## 2013-11-18 ENCOUNTER — Ambulatory Visit (INDEPENDENT_AMBULATORY_CARE_PROVIDER_SITE_OTHER): Payer: BC Managed Care – PPO | Admitting: Internal Medicine

## 2013-11-18 ENCOUNTER — Encounter: Payer: Self-pay | Admitting: Internal Medicine

## 2013-11-18 VITALS — BP 128/80 | HR 80 | Temp 98.2°F | Wt 260.0 lb

## 2013-11-18 DIAGNOSIS — F411 Generalized anxiety disorder: Secondary | ICD-10-CM

## 2013-11-18 DIAGNOSIS — G479 Sleep disorder, unspecified: Secondary | ICD-10-CM

## 2013-11-18 DIAGNOSIS — R079 Chest pain, unspecified: Secondary | ICD-10-CM | POA: Insufficient documentation

## 2013-11-18 DIAGNOSIS — R0789 Other chest pain: Secondary | ICD-10-CM

## 2013-11-18 MED ORDER — CITALOPRAM HYDROBROMIDE 20 MG PO TABS: 20.0000 mg | ORAL_TABLET | Freq: Every day | ORAL | Status: DC

## 2013-11-18 MED ORDER — BUPROPION HCL ER (XL) 150 MG PO TB24: ORAL_TABLET | ORAL | Status: DC

## 2013-11-18 MED ORDER — ALPRAZOLAM 0.5 MG PO TABS: 0.5000 mg | ORAL_TABLET | Freq: Two times a day (BID) | ORAL | Status: DC | PRN

## 2013-11-18 NOTE — Progress Notes (Signed)
Pre visit review using our clinic review tool, if applicable. No additional management support is needed unless otherwise documented below in the visit note. 

## 2013-11-18 NOTE — Assessment & Plan Note (Signed)
Atypical symptoms Does have poor exercise tolerance but not really any recent change Lots of stress-- and this seems to be the trigger EKG is normal No further work up for now

## 2013-11-18 NOTE — Assessment & Plan Note (Signed)
Has ongoing symptoms Did have negative sleep study around 20 years ago ---but has gained weight and history is concerning for obstructive sleep apnea Will set up with sleep specialist

## 2013-11-18 NOTE — Progress Notes (Signed)
Subjective:    Patient ID: James Camacho, male    DOB: [birth year], 63 y.o.   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 Working with an attorney This goes back for 3-4 years but may be coming to a head Has pre-indictment hearing in November--- then formal hearing in March  Not sleeping now Stressed 24/7 Wakes him up at night even when he gets to sleep  He worries about the business if he has to go to jail 2 partners are still involved  Has been using the xanax to calm him down when he gets worked up Had cut back on citalopram and bupropion--he felt it was making him groggy Then had actually stopped them--now [facility]  Having chest pain now from the stress Stopped going to gym--membership expired So depressed so not doing anything Gets easy DOE  Non restorative sleep Snorts himself awake Wife has to sleep in another room due to his snoring (and being on call, etc)  Current Outpatient Prescriptions on File Prior to Visit  Medication Sig Dispense Refill  . ALPRAZolam (XANAX) 0.5 MG tablet TAKE 1 TABLET BY MOUTH 3 TIMES A DAY  60 tablet  0  . amLODipine (NORVASC) 10 MG tablet Take 1 tablet (10 mg total) by mouth daily.  90 tablet  3  . buPROPion (WELLBUTRIN XL) 150 MG 24 hr tablet Take 1 tablet by mouth twice daily  180 tablet  3  . carvedilol (COREG) 12.5 MG tablet Take 1 tablet (12.5 mg total) by mouth 2 (two) times daily with a meal.  180 tablet  3  . citalopram (CELEXA) 20 MG tablet TAKE 1 TABLET BY MOUTH TWICE A DAY  180 tablet  3  . furosemide (LASIX) 40 MG tablet Take 1 tablet (40 mg total) by mouth 2 (two) times daily.  60 tablet  11  . losartan (COZAAR) 100 MG tablet Take 1 tablet (100 mg total) by mouth daily.  90 tablet  3  . tadalafil (CIALIS) 20 MG tablet Take 1 tablet (20 mg total) by mouth daily as needed for erectile dysfunction.  4 tablet   11  . temazepam (RESTORIL) 15 MG capsule TAKE 1 TO 2 CAPSULES BY MOUTH AT BEDTIME  60 capsule  0  . zoster vaccine live, PF, (ZOSTAVAX) 09983 UNT/0.65ML injection Inject 19,400 Units into the skin once.  1 each  0   No current facility-administered medications on file prior to visit.    Allergies  Allergen Reactions  . Penicillins Rash  . Shellfish Allergy Hives    Past Medical History  Diagnosis Date  . Hypertension   . Anxiety   . ED (erectile dysfunction)   . Hyperlipidemia     LDL in 120's  . H/O seasonal allergies   . Depression     Past Surgical History  Procedure Laterality Date  . Appendectomy      with chole  . Cholecystectomy      done by Dr Sharlet Salina in 1980's  . Cardiovascular stress test  02/09    negative    Family History  Problem Relation Age of Onset  . Diabetes Mother   . Arthritis Mother   . Hypertension Mother   . Parkinsonism Father   . Heart disease Father   . Cancer Neg Hx     no colon or prostate cancer  .  Colon cancer Neg Hx   . Esophageal cancer Neg Hx   . Stomach cancer Neg Hx   . Rectal cancer Neg Hx     History   Social History  . Marital Status: Married    Spouse Name: N/A    Number of Children: 3  . Years of Education: N/A   Occupational History  . 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 Smoking status: Never Smoker   . Smokeless tobacco: Never Used  . Alcohol Use: Yes     Comment: rare  . Drug Use: No  . Sexual Activity: Not on file   Other Topics Concern  . Not on file   Social History Narrative   Tries to walk   Review of Systems Appetite is okay at times Weight is up somewhat    Objective:   Physical Exam  Constitutional: He appears well-developed. No distress.  Neck: Normal range of motion. Neck supple. No thyromegaly present.  Cardiovascular: Normal rate, regular rhythm and normal heart sounds.  Exam reveals no gallop.   No murmur heard. Pulmonary/Chest:  Effort normal and breath sounds normal. No respiratory distress. He has no wheezes. He has no rales.  Abdominal: Soft. There is no tenderness.  Musculoskeletal: He exhibits no edema and no tenderness.  Lymphadenopathy:    He has no cervical adenopathy.  Psychiatric:  Seems down but not overtly depressed Affect appropriate Normal appearance and sleep          Assessment & Plan:

## 2013-11-18 NOTE — Assessment & Plan Note (Signed)
Extreme stress with legal issues and facing possible incarceration Will restart the bupropion, and the citalopram (at lower dose) Alprazolam for prn

## 2013-11-26 ENCOUNTER — Ambulatory Visit (INDEPENDENT_AMBULATORY_CARE_PROVIDER_SITE_OTHER): Payer: BC Managed Care – PPO | Admitting: Internal Medicine

## 2013-11-26 ENCOUNTER — Encounter: Payer: Self-pay | Admitting: Internal Medicine

## 2013-11-26 VITALS — BP 128/74 | HR 61 | Ht 69.0 in | Wt 259.0 lb

## 2013-11-26 DIAGNOSIS — G4733 Obstructive sleep apnea (adult) (pediatric): Secondary | ICD-10-CM

## 2013-11-26 DIAGNOSIS — G479 Sleep disorder, unspecified: Secondary | ICD-10-CM

## 2013-11-26 NOTE — Assessment & Plan Note (Signed)
We discussed the process of sleep testing and the medical concerns associated with possible sleep apnea. Emphasis on good sleep hygiene, the importance of adequate sleep and his responsibility to drive safely Plan-schedule NPSG split protocol

## 2013-11-26 NOTE — Progress Notes (Signed)
11/26/13- 31 yoM never smoker Referred courtesy of Dr. Silvio Pate for a sleep study.  Pt c/o fatigue, unrested after sleeping, snoring to the point that pt wakes himself up X5 years.  Had a sleep study at least 15 years ago at [facility], but was told results were insignificant and he was never treated. Wife has to sleep in separate bedroom because of his snoring. Bedtime between 10 and 11 PM, sleep latency 15 minutes, awake twice before up at 7 AM. Remote tonsillectomy. Treated for hypertension and has history of allergic rhinitis. Denies cardiopulmonary disease. Not aware of a family history of sleep disorder breathing. Working daytime job as a Nurse, learning disability Taking anxiolytic and antidepressant medications as listed.  Prior to Admission medications   Medication Sig Start Date End Date Taking? Authorizing Provider  ALPRAZolam Duanne Moron) 0.5 MG tablet Take 1 tablet (0.5 mg total) by mouth 2 (two) times daily as needed for anxiety. 11/18/13  Yes Venia Carbon, MD  amLODipine (NORVASC) 10 MG tablet Take 1 tablet (10 mg total) by mouth daily. 02/12/13  Yes Venia Carbon, MD  buPROPion (WELLBUTRIN XL) 150 MG 24 hr tablet Take 1 tablet by mouth twice daily 11/18/13  Yes Venia Carbon, MD  carvedilol (COREG) 12.5 MG tablet Take 1 tablet (12.5 mg total) by mouth 2 (two) times daily with a meal. 04/24/13  Yes Venia Carbon, MD  citalopram (CELEXA) 20 MG tablet Take 1 tablet (20 mg total) by mouth daily. 11/18/13  Yes Venia Carbon, MD  furosemide (LASIX) 40 MG tablet Take 1 tablet (40 mg total) by mouth 2 (two) times daily. 08/28/13  Yes Venia Carbon, MD  losartan (COZAAR) 100 MG tablet Take 1 tablet (100 mg total) by mouth daily. 07/31/13  Yes Venia Carbon, MD  tadalafil (CIALIS) 20 MG tablet Take 1 tablet (20 mg total) by mouth daily as needed for erectile dysfunction. 04/24/13  Yes Venia Carbon, MD  temazepam (RESTORIL) 15 MG capsule TAKE 1 TO 2 CAPSULES BY MOUTH AT BEDTIME 07/05/12  Yes Venia Carbon, MD   Past Medical History  Diagnosis Date  . Hypertension   . Anxiety   . ED (erectile dysfunction)   . Hyperlipidemia     LDL in 120's  . H/O seasonal allergies   . Depression    Past Surgical History  Procedure Laterality Date  . Appendectomy      with chole  . Cholecystectomy      done by Dr Sharlet Salina in 1980's  . Cardiovascular stress test  02/09    negative   Family History  Problem Relation Age of Onset  . Diabetes Mother   . Arthritis Mother   . Hypertension Mother   . Parkinsonism Father   . Heart disease Father   . Cancer Neg Hx     no colon or prostate cancer  . Colon cancer Neg Hx   . Esophageal cancer Neg Hx   . Stomach cancer Neg Hx   . Rectal cancer Neg Hx    History   Social History  . Marital Status: Married    Spouse Name: N/A    Number of Children: 3  . Years of Education: N/A   Occupational History  . 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 Smoking status: Never Smoker   . Smokeless tobacco: Never Used  . Alcohol Use: Yes     Comment: rare  . Drug  Use: No  . Sexual Activity: Not on file   Other Topics Concern  . Not on file   Social History Narrative   Tries to walk   ROS-see HPI Constitutional:   No-   weight loss, night sweats, fevers, chills, fatigue, lassitude. HEENT:   No-  headaches, difficulty swallowing, tooth/dental problems, sore throat,       No-  sneezing, itching, ear ache, nasal congestion, post nasal drip,  CV:  No-   chest pain, orthopnea, PND, swelling in lower extremities, anasarca,                                  dizziness, palpitations Resp: No-   shortness of breath with exertion or at rest.              No-   productive cough,  No non-productive cough,  No- coughing up of blood.              No-   change in color of mucus.  No- wheezing.   Skin: No-   rash or lesions. GI:  No-   heartburn, indigestion, abdominal pain, nausea, vomiting, diarrhea,                  change in bowel habits, loss of appetite GU: No-   dysuria, change in color of urine, no urgency or frequency.  No- flank pain. MS:  No-   joint pain or swelling.  No- decreased range of motion.  No- back pain. Neuro-     nothing unusual Psych:  No- change in mood or affect. No depression or anxiety.  No memory loss.  OBJ- Physical Exam General- Alert, Oriented, Affect-appropriate, Distress- none acute, + Obese Skin- rash-none, lesions- none, excoriation- none Lymphadenopathy- none Head- atraumatic            Eyes- Gross vision intact, PERRLA, conjunctivae and secretions clear            Ears- Hearing, canals-normal            Nose- Clear, no-Septal dev, mucus, polyps, erosion, perforation             Throat- Mallampati IV , mucosa clear , drainage- none, tonsils- atrophic,  Neck- flexible , trachea midline, no stridor , thyroid nl, carotid no bruit Chest - symmetrical excursion , unlabored           Heart/CV- RRR , no murmur , no gallop  , no rub, nl s1 s2                           - JVD- none , edema- none, stasis changes- none, varices- none           Lung- clear to P&A, wheeze- none, cough- none , dullness-none, rub- none           Chest wall-  Abd- tender-no, distended-no, bowel sounds-present, HSM- no Br/ Gen/ Rectal- Not done, not indicated Extrem- cyanosis- none, clubbing, none, atrophy- none, strength- nl Neuro- grossly intact to observation

## 2013-11-26 NOTE — Patient Instructions (Signed)
Order- schedule Split protocol NPSG dx OSA  Please call as needed

## 2013-11-28 ENCOUNTER — Other Ambulatory Visit: Payer: Self-pay | Admitting: Internal Medicine

## 2013-12-06 ENCOUNTER — Encounter: Payer: Self-pay | Admitting: Internal Medicine

## 2013-12-12 ENCOUNTER — Other Ambulatory Visit: Payer: Self-pay | Admitting: Internal Medicine

## 2013-12-12 NOTE — Telephone Encounter (Signed)
Okay #[phone] x 0 Let him know he is a few days early but I will approve it this time

## 2013-12-13 MED ORDER — ALPRAZOLAM 0.5 MG PO TABS: 0.5000 mg | ORAL_TABLET | Freq: Two times a day (BID) | ORAL | Status: DC | PRN

## 2013-12-13 NOTE — Telephone Encounter (Signed)
Medication called in to pharmacy and patient notified of Dr. Alla German comments. Patient verbalized understanding.

## 2013-12-18 ENCOUNTER — Ambulatory Visit: Payer: BC Managed Care – PPO | Admitting: Internal Medicine

## 2013-12-19 ENCOUNTER — Encounter: Payer: Self-pay | Admitting: Internal Medicine

## 2013-12-19 ENCOUNTER — Ambulatory Visit (INDEPENDENT_AMBULATORY_CARE_PROVIDER_SITE_OTHER): Payer: BC Managed Care – PPO | Admitting: Internal Medicine

## 2013-12-19 VITALS — BP 126/78 | HR 76 | Temp 98.3°F | Resp 14 | Wt 255.8 lb

## 2013-12-19 DIAGNOSIS — Z23 Encounter for immunization: Secondary | ICD-10-CM

## 2013-12-19 DIAGNOSIS — F411 Generalized anxiety disorder: Secondary | ICD-10-CM

## 2013-12-19 NOTE — Progress Notes (Signed)
Pre visit review using our clinic review tool, if applicable. No additional management support is needed unless otherwise documented below in the visit note. 

## 2013-12-19 NOTE — Assessment & Plan Note (Signed)
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

## 2013-12-19 NOTE — Patient Instructions (Signed)
Please check out the book ---- How To Meditate

## 2013-12-19 NOTE — Progress Notes (Signed)
Subjective:    Patient ID: James Camacho, male    DOB: 20-Apr-1950, 63 y.o.   MRN: [id number]  HPI Reviewed his e messages Did increase the bupropion to tid and taking the alprazolam tid Still feels jittery and tense regularly Alprazolam does help  No news from IRS Won't get any word till sometime after November Still works a full schedule Business is doing okay  Has been praying a lot--this is comforting for him Has never tried to meditation  Current Outpatient Prescriptions on File Prior to Visit  Medication Sig Dispense Refill  . amLODipine (NORVASC) 10 MG tablet Take 1 tablet (10 mg total) by mouth daily.  90 tablet  3  . carvedilol (COREG) 12.5 MG tablet Take 1 tablet (12.5 mg total) by mouth 2 (two) times daily with a meal.  180 tablet  3  . citalopram (CELEXA) 20 MG tablet Take 1 tablet (20 mg total) by mouth daily.  90 tablet  3  . furosemide (LASIX) 40 MG tablet TAKE 1 TABLET (40 MG TOTAL) BY MOUTH DAILY.  30 tablet  3  . losartan (COZAAR) 100 MG tablet Take 1 tablet (100 mg total) by mouth daily.  90 tablet  3  . tadalafil (CIALIS) 20 MG tablet Take 1 tablet (20 mg total) by mouth daily as needed for erectile dysfunction.  4 tablet  11  . temazepam (RESTORIL) 15 MG capsule TAKE 1 TO 2 CAPSULES BY MOUTH AT BEDTIME  60 capsule  0   No current facility-administered medications on file prior to visit.    Allergies  Allergen Reactions  . Other     Pt can only take Tylenol for pain- anything prescribed that he has tried breaks him out in a rash.   . Penicillins Rash  . Shellfish Allergy Hives    Past Medical History  Diagnosis Date  . Hypertension   . Anxiety   . ED (erectile dysfunction)   . Hyperlipidemia     LDL in 120's  . H/O seasonal allergies   . Depression     Past Surgical History  Procedure Laterality Date  . Appendectomy      with chole  . Cholecystectomy      done by Dr Sharlet Salina in 1980's  . Cardiovascular stress test  02/09    negative     Family History  Problem Relation Age of Onset  . Diabetes Mother   . Arthritis Mother   . Hypertension Mother   . Parkinsonism Father   . Heart disease Father   . Cancer Neg Hx     no colon or prostate cancer  . Colon cancer Neg Hx   . Esophageal cancer Neg Hx   . Stomach cancer Neg Hx   . Rectal cancer Neg Hx     History   Social History  . Marital Status: Married    Spouse Name: N/A    Number of Children: 3  . Years of Education: N/A   Occupational History  . 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 Smoking status: Never Smoker   . Smokeless tobacco: Never Used  . Alcohol Use: Yes     Comment: rare  . Drug Use: No  . Sexual Activity: Not on file   Other Topics Concern  . Not on file   Social History Narrative   Tries to walk   Review of Systems Saw Dr Sharyn Dross for sleep study Has been sleeping  better Appetite is "so-so"--- weight down a few pounds    Objective:   Physical Exam  Constitutional: He appears well-developed and well-nourished. No distress.  Psychiatric: He has a normal mood and affect. His behavior is normal.          Assessment & Plan:

## 2014-01-10 ENCOUNTER — Other Ambulatory Visit: Payer: Self-pay | Admitting: Internal Medicine

## 2014-01-10 MED ORDER — ALPRAZOLAM 0.5 MG PO TABS: 0.5000 mg | ORAL_TABLET | Freq: Three times a day (TID) | ORAL | Status: DC | PRN

## 2014-01-10 NOTE — Telephone Encounter (Signed)
12/13/13 

## 2014-01-10 NOTE — Telephone Encounter (Signed)
rx called into pharmacy

## 2014-01-10 NOTE — Telephone Encounter (Signed)
Okay #60 x 0 

## 2014-01-20 ENCOUNTER — Encounter: Payer: Self-pay | Admitting: Internal Medicine

## 2014-01-20 NOTE — Telephone Encounter (Signed)
I looks like Quinapril was stopped and Cozaar was started, please advise

## 2014-01-28 ENCOUNTER — Ambulatory Visit (HOSPITAL_BASED_OUTPATIENT_CLINIC_OR_DEPARTMENT_OTHER): Payer: BC Managed Care – PPO | Attending: Internal Medicine

## 2014-01-31 ENCOUNTER — Other Ambulatory Visit: Payer: Self-pay | Admitting: Internal Medicine

## 2014-02-02 ENCOUNTER — Other Ambulatory Visit: Payer: Self-pay | Admitting: Internal Medicine

## 2014-02-03 MED ORDER — ALPRAZOLAM 0.5 MG PO TABS: 0.5000 mg | ORAL_TABLET | Freq: Three times a day (TID) | ORAL | Status: DC | PRN

## 2014-02-03 NOTE — Telephone Encounter (Signed)
Medication phoned to pharmacy.  

## 2014-02-03 NOTE — Telephone Encounter (Signed)
03/22/13

## 2014-02-03 NOTE — Telephone Encounter (Signed)
Approved: #[phone] x 0 

## 2014-02-11 ENCOUNTER — Ambulatory Visit: Payer: BC Managed Care – PPO | Admitting: Internal Medicine

## 2014-02-12 ENCOUNTER — Encounter: Payer: Self-pay | Admitting: Internal Medicine

## 2014-03-03 ENCOUNTER — Other Ambulatory Visit: Payer: Self-pay | Admitting: Internal Medicine

## 2014-03-04 MED ORDER — ALPRAZOLAM 0.5 MG PO TABS: 0.5000 mg | ORAL_TABLET | Freq: Three times a day (TID) | ORAL | Status: DC | PRN

## 2014-03-04 NOTE — Telephone Encounter (Signed)
rx called into pharmacy

## 2014-03-04 NOTE — Telephone Encounter (Signed)
02/03/14 

## 2014-03-04 NOTE — Telephone Encounter (Signed)
Approved: #[phone] x 0 

## 2014-03-14 ENCOUNTER — Telehealth: Payer: Self-pay | Admitting: Internal Medicine

## 2014-03-14 NOTE — Telephone Encounter (Signed)
Patient Name: James Camacho  DOB: 05/14/1950    Nurse Assessment  Nurse: Vallery Sa, RN, Cathy Date/Time (Eastern Time): 03/14/2014 1:20:42 PM  Confirm and document reason for call. If symptomatic, describe symptoms. ---Caller states he carried something very heavy last week and injured his left ribcage. No breathing difficulty. No fever.  Has the patient traveled out of the country within the last 30 days? ---No  Does the patient require triage? ---Yes  Related visit to physician within the last 2 weeks? ---No  Does the PT have any chronic conditions? (i.e. diabetes, asthma, etc.) ---Yes  List chronic conditions. ---High Blood Pressure, Depression, Panic Attacks     Guidelines    Guideline Title Affirmed Question Affirmed Notes  Chest Injury [1] High-risk adult (e.g., age > 60, osteoporosis, chronic steroid use) AND [2] still hurts    Final Disposition User   See Physician within 24 Hours [place], RN, Tye [place]    Comments  Called the office backline and Morey Hummingbird will assist him further with appointment options.

## 2014-03-15 NOTE — Telephone Encounter (Signed)
Please check on him on Monday if he doesn't have an appt

## 2014-03-17 NOTE — Telephone Encounter (Signed)
Pt went to walk-in and they gave him flexeril, he's feeling better.

## 2014-03-18 ENCOUNTER — Ambulatory Visit: Payer: Self-pay | Admitting: Internal Medicine

## 2014-03-26 ENCOUNTER — Ambulatory Visit (INDEPENDENT_AMBULATORY_CARE_PROVIDER_SITE_OTHER): Payer: BLUE CROSS/BLUE SHIELD | Admitting: Internal Medicine

## 2014-03-26 ENCOUNTER — Encounter: Payer: Self-pay | Admitting: Internal Medicine

## 2014-03-26 VITALS — BP 110/60 | HR 75 | Wt 249.0 lb

## 2014-03-26 DIAGNOSIS — F411 Generalized anxiety disorder: Secondary | ICD-10-CM | POA: Diagnosis not present

## 2014-03-26 NOTE — Progress Notes (Signed)
Subjective:    Patient ID: James Camacho, male    DOB: 06/21/1950, 64 y.o.   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 ALPRAZolam (XANAX) 0.5 MG tablet Take 1 tablet (0.5 mg total) by mouth 3 (three) times daily as needed for anxiety. 60 tablet 0  . amLODipine (NORVASC) 10 MG tablet TAKE 1 TABLET (10 MG TOTAL) BY MOUTH DAILY. 90 tablet 3  . buPROPion (WELLBUTRIN XL) 150 MG 24 hr tablet Take 150 mg by mouth 3 (three) times daily. Take 1 tablet by mouth twice daily    . carvedilol (COREG) 12.5 MG tablet Take 1 tablet (12.5 mg total) by mouth 2 (two) times daily with a meal. 180 tablet 3  . citalopram (CELEXA) 20 MG tablet Take 1 tablet (20 mg total) by mouth daily. 90 tablet 3  . furosemide (LASIX) 40 MG tablet TAKE 1 TABLET (40 MG TOTAL) BY MOUTH DAILY. 30 tablet 3  . losartan (COZAAR) 100 MG tablet Take 1 tablet (100 mg total) by mouth daily. 90 tablet 3  . tadalafil (CIALIS) 20 MG tablet Take 1 tablet (20 mg total) by mouth daily as needed for erectile dysfunction. 4 tablet 11  . temazepam (RESTORIL) 15 MG capsule TAKE 1 TO 2 CAPSULES BY MOUTH AT BEDTIME 60 capsule 0   No current facility-administered medications on file prior to visit.    Allergies  Allergen Reactions  . Other     Pt can only take Tylenol for pain- anything prescribed that he has tried breaks him out in a rash.   . Penicillins Rash  . Shellfish Allergy Hives    Past Medical History  Diagnosis Date  . Hypertension   .  Anxiety   . ED (erectile dysfunction)   . Hyperlipidemia     LDL in 120's  . H/O seasonal allergies   . Depression     Past Surgical History  Procedure Laterality Date  . Appendectomy      with chole  . Cholecystectomy      done by Dr Sharlet Salina in 1980's  . Cardiovascular stress test  02/09    negative    Family History  Problem Relation Age of Onset  . Diabetes Mother   . Arthritis Mother   . Hypertension Mother   . Parkinsonism Father   . Heart disease Father   . Cancer Neg Hx     no colon or prostate cancer  . Colon cancer Neg Hx   . Esophageal cancer Neg Hx   . Stomach cancer Neg Hx   . Rectal cancer Neg Hx     History   Social History  . Marital Status: Married    Spouse Name: N/A    Number of Children: 3  . Years of Education: N/A   Occupational History  . 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 Smoking status: Never Smoker   . Smokeless tobacco: Never Used  . Alcohol Use: Yes  Comment: rare  . Drug Use: No  . Sexual Activity: Not on file   Other Topics Concern  . Not on file   Social History Narrative   Tries to walk   Review of Systems Sleeps okay Appetite is fine but has tried to cut back on what he eats Weight is down 7#    Objective:   Physical Exam  Constitutional: He appears well-developed and well-nourished. No distress.  Psychiatric: He has a normal mood and affect. His behavior is normal.  Seems much more relaxed Not depressed           Assessment & Plan:

## 2014-03-26 NOTE — Assessment & Plan Note (Signed)
Clearly seems to be better since the disposition of his case seems to be approaching--and he won't need to go to jail Asked him to try to decrease the alprazolam Continue the bupropion at bid for now

## 2014-03-26 NOTE — Progress Notes (Signed)
Pre visit review using our clinic review tool, if applicable. No additional management support is needed unless otherwise documented below in the visit note. 

## 2014-03-30 ENCOUNTER — Other Ambulatory Visit: Payer: Self-pay | Admitting: Internal Medicine

## 2014-03-31 ENCOUNTER — Encounter: Payer: Self-pay | Admitting: Internal Medicine

## 2014-03-31 ENCOUNTER — Other Ambulatory Visit: Payer: Self-pay | Admitting: Internal Medicine

## 2014-03-31 MED ORDER — ALPRAZOLAM 0.5 MG PO TABS: 0.5000 mg | ORAL_TABLET | Freq: Three times a day (TID) | ORAL | Status: DC | PRN

## 2014-03-31 NOTE — Telephone Encounter (Signed)
rx called into pharmacy

## 2014-03-31 NOTE — Telephone Encounter (Signed)
03/04/14 

## 2014-03-31 NOTE — Telephone Encounter (Signed)
Approved: #[phone] x 0 

## 2014-04-29 ENCOUNTER — Other Ambulatory Visit: Payer: Self-pay | Admitting: Internal Medicine

## 2014-04-30 MED ORDER — ALPRAZOLAM 0.5 MG PO TABS: 0.5000 mg | ORAL_TABLET | Freq: Three times a day (TID) | ORAL | Status: DC | PRN

## 2014-04-30 NOTE — Telephone Encounter (Signed)
[facility] refill called [facility] per [facility] approval.

## 2014-04-30 NOTE — Telephone Encounter (Signed)
Approved: #[phone] x 0 

## 2014-05-01 ENCOUNTER — Other Ambulatory Visit: Payer: Self-pay | Admitting: Internal Medicine

## 2014-05-26 ENCOUNTER — Other Ambulatory Visit: Payer: Self-pay | Admitting: Internal Medicine

## 2014-05-27 MED ORDER — ALPRAZOLAM 0.5 MG PO TABS: 0.5000 mg | ORAL_TABLET | Freq: Three times a day (TID) | ORAL | Status: DC | PRN

## 2014-05-27 NOTE — Telephone Encounter (Signed)
Called [facility]

## 2014-05-27 NOTE — Telephone Encounter (Signed)
Approved: #[phone] x 0 

## 2014-05-27 NOTE — Telephone Encounter (Signed)
04/30/14 

## 2014-06-12 ENCOUNTER — Encounter: Payer: Self-pay | Admitting: Internal Medicine

## 2014-06-13 MED ORDER — LORAZEPAM 0.5 MG PO TABS: 0.5000 mg | ORAL_TABLET | Freq: Three times a day (TID) | ORAL | Status: DC

## 2014-06-13 NOTE — Telephone Encounter (Signed)
Discussed with patient He faces Federal prison time of something less than a year--soon Hopefully will be at [facility] but could be [place]  Alprazolam wearing off--takes at night, then he is up in the middle of the night Uses it regularly tid  Plan--we will try changing to lorazepam to see if that lasts a longer time  Frost, Please phone in the Rx I entered

## 2014-06-13 NOTE — Telephone Encounter (Signed)
rx called into pharmacy

## 2014-07-18 ENCOUNTER — Other Ambulatory Visit: Payer: Self-pay | Admitting: Internal Medicine

## 2014-07-18 MED ORDER — LORAZEPAM 0.5 MG PO TABS: 0.5000 mg | ORAL_TABLET | Freq: Three times a day (TID) | ORAL | Status: DC

## 2014-07-18 NOTE — Telephone Encounter (Signed)
Approved: #[phone] x 0 

## 2014-07-18 NOTE — Telephone Encounter (Signed)
rx called into pharmacy

## 2014-07-18 NOTE — Telephone Encounter (Signed)
06/13/14  

## 2014-07-22 ENCOUNTER — Encounter: Payer: Self-pay | Admitting: Internal Medicine

## 2014-07-22 ENCOUNTER — Other Ambulatory Visit: Payer: Self-pay | Admitting: Internal Medicine

## 2014-07-23 NOTE — Telephone Encounter (Signed)
Sent pt message back asking him to call the office for an appt.

## 2014-07-23 NOTE — Telephone Encounter (Signed)
Temazepam was last filled 07/05/2012

## 2014-07-23 NOTE — Telephone Encounter (Signed)
Spoke with patient and this is no longer needed

## 2014-07-23 NOTE — Telephone Encounter (Signed)
Spoke to him today Took benedryl and rested Feels better Reaction only on skin--no systemic symptoms Doesn't need appt Advised cetirizine or fexofenadine for day use  Reviewed the temazepam--- was refilled May 1 so too soon

## 2014-07-23 NOTE — Telephone Encounter (Signed)
Too early I told him---it was filled 5/1

## 2014-07-24 ENCOUNTER — Encounter: Payer: Self-pay | Admitting: Internal Medicine

## 2014-07-25 ENCOUNTER — Encounter: Payer: Self-pay | Admitting: Internal Medicine

## 2014-07-25 ENCOUNTER — Ambulatory Visit (INDEPENDENT_AMBULATORY_CARE_PROVIDER_SITE_OTHER): Payer: BLUE CROSS/BLUE SHIELD | Admitting: Internal Medicine

## 2014-07-25 VITALS — BP 132/80 | HR 75 | Temp 98.4°F | Wt 248.0 lb

## 2014-07-25 DIAGNOSIS — T781XXA Other adverse food reactions, not elsewhere classified, initial encounter: Secondary | ICD-10-CM | POA: Diagnosis not present

## 2014-07-25 DIAGNOSIS — T61781A Other shellfish poisoning, accidental (unintentional), initial encounter: Secondary | ICD-10-CM | POA: Insufficient documentation

## 2014-07-25 MED ORDER — METHYLPREDNISOLONE ACETATE 80 MG/ML IJ SUSP
80.0000 mg | Freq: Once | INTRAMUSCULAR | Status: AC
  Administered 2014-07-25: 80 mg via INTRAMUSCULAR

## 2014-07-25 NOTE — Progress Notes (Signed)
Pre visit review using our clinic review tool, if applicable. No additional management support is needed unless otherwise documented below in the visit note. 

## 2014-07-25 NOTE — Addendum Note (Signed)
Addended by: Despina Hidden on: 07/25/2014 09:05 AM   Modules accepted: Orders

## 2014-07-25 NOTE — Progress Notes (Signed)
Subjective:    Patient ID: James Camacho, male    DOB: 12-03-1950, 64 y.o.   MRN: [id number]  HPI Here due to rash  Broke out in "heavy rash"  3 days ago went to [employer] and bought a lot of shrimp and laid it on his arm (frozen) Noted holes in the bags when thawing them He is allergic to shellfish  That night he got generalized rash Noted itching and burning  Has not responded well to prednisone in past Needs shot of steroid  Has been taking OTC anthistamines  Current Outpatient Prescriptions on File Prior to Visit  Medication Sig Dispense Refill  . amLODipine (NORVASC) 10 MG tablet TAKE 1 TABLET (10 MG TOTAL) BY MOUTH DAILY. 90 tablet 3  . buPROPion (WELLBUTRIN XL) 150 MG 24 hr tablet Take 150 mg by mouth 3 (three) times daily. Take 1 tablet by mouth twice daily    . carvedilol (COREG) 12.5 MG tablet TAKE 1 TABLET (12.5 MG TOTAL) BY MOUTH 2 (TWO) TIMES DAILY WITH A MEAL. 180 tablet 3  . citalopram (CELEXA) 20 MG tablet Take 1 tablet (20 mg total) by mouth daily. 90 tablet 3  . furosemide (LASIX) 40 MG tablet TAKE 1 TABLET (40 MG TOTAL) BY MOUTH DAILY. 30 tablet 3  . LORazepam (ATIVAN) 0.5 MG tablet Take 1 tablet (0.5 mg total) by mouth 3 (three) times daily. 90 tablet 0  . losartan (COZAAR) 100 MG tablet Take 1 tablet (100 mg total) by mouth daily. 90 tablet 3  . tadalafil (CIALIS) 20 MG tablet Take 1 tablet (20 mg total) by mouth daily as needed for erectile dysfunction. 4 tablet 11   No current facility-administered medications on file prior to visit.    Allergies  Allergen Reactions  . Other     Pt can only take Tylenol for pain- anything prescribed that he has tried breaks him out in a rash.   . Penicillins Rash  . Shellfish Allergy Hives    Past Medical History  Diagnosis Date  . Hypertension   . Anxiety   . ED (erectile dysfunction)   . Hyperlipidemia     LDL in 120's  . H/O seasonal allergies   . Depression     Past Surgical History  Procedure  Laterality Date  . Appendectomy      with chole  . Cholecystectomy      done by Dr Sharlet Salina in 1980's  . Cardiovascular stress test  02/09    negative    Family History  Problem Relation Age of Onset  . Diabetes Mother   . Arthritis Mother   . Hypertension Mother   . Parkinsonism Father   . Heart disease Father   . Cancer Neg Hx     no colon or prostate cancer  . Colon cancer Neg Hx   . Esophageal cancer Neg Hx   . Stomach cancer Neg Hx   . Rectal cancer Neg Hx     History   Social History  . Marital Status: Married    Spouse Name: N/A  . Number of Children: 3  . Years of Education: N/A   Occupational History  . 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 Smoking status: Never Smoker   . Smokeless tobacco: Never Used  . Alcohol Use: Yes     Comment: rare  . Drug Use: No  . Sexual Activity: Not on file   Other Topics Concern  .  Not on file   Social History Narrative   Tries to walk   Review of Systems  No fever No throat symptoms or breathing difficulty     Objective:   Physical Exam  Constitutional: He appears well-developed and well-nourished. No distress.  Skin:  Widespread large round and oval red macules  Not urticarial          Assessment & Plan:

## 2014-07-25 NOTE — Assessment & Plan Note (Signed)
Systemic but only on skin Will give depomedrol  Continue the OTC antihistamines

## 2014-07-27 ENCOUNTER — Other Ambulatory Visit: Payer: Self-pay | Admitting: Internal Medicine

## 2014-10-25 ENCOUNTER — Other Ambulatory Visit: Payer: Self-pay | Admitting: Internal Medicine

## 2015-08-21 ENCOUNTER — Encounter: Payer: BLUE CROSS/BLUE SHIELD | Admitting: Internal Medicine

## 2015-08-21 ENCOUNTER — Encounter: Payer: Self-pay | Admitting: Internal Medicine

## 2015-08-21 NOTE — Telephone Encounter (Signed)
Pt left vm with the same info he sent in the email to Dr Silvio Pate; pt has appt for CPX on 10/29/15. Last f./u was 03/26/14.

## 2015-09-07 ENCOUNTER — Encounter: Payer: Self-pay | Admitting: Internal Medicine

## 2015-09-07 MED ORDER — BUSPIRONE HCL 5 MG PO TABS: 5.0000 mg | ORAL_TABLET | Freq: Three times a day (TID) | ORAL | Status: DC

## 2015-09-07 MED ORDER — CITALOPRAM HYDROBROMIDE 20 MG PO TABS
20.0000 mg | ORAL_TABLET | Freq: Every day | ORAL | Status: DC
Start: 2015-09-07 — End: 2017-01-04

## 2015-09-07 MED ORDER — CARVEDILOL 12.5 MG PO TABS: ORAL_TABLET | ORAL | Status: DC

## 2015-09-07 MED ORDER — LISINOPRIL 40 MG PO TABS: 40.0000 mg | ORAL_TABLET | Freq: Every day | ORAL | Status: DC

## 2015-09-07 MED ORDER — FUROSEMIDE 40 MG PO TABS: ORAL_TABLET | ORAL | Status: DC

## 2015-09-07 MED ORDER — AMLODIPINE BESYLATE 10 MG PO TABS: ORAL_TABLET | ORAL | Status: DC

## 2015-09-07 NOTE — Telephone Encounter (Signed)
Called pt to verify lisinopril/losartan and buproprion/buspirone. He is now on lisinopril and buspirone. Sent meds for 90 days to give him time to get an appointment made.

## 2015-09-11 ENCOUNTER — Encounter: Payer: Self-pay | Admitting: Internal Medicine

## 2015-10-29 ENCOUNTER — Encounter: Payer: Self-pay | Admitting: Internal Medicine

## 2015-10-29 ENCOUNTER — Ambulatory Visit (INDEPENDENT_AMBULATORY_CARE_PROVIDER_SITE_OTHER): Payer: Self-pay | Admitting: Internal Medicine

## 2015-10-29 DIAGNOSIS — Z0000 Encounter for general adult medical examination without abnormal findings: Secondary | ICD-10-CM

## 2015-10-29 DIAGNOSIS — F411 Generalized anxiety disorder: Secondary | ICD-10-CM

## 2015-10-29 DIAGNOSIS — I10 Essential (primary) hypertension: Secondary | ICD-10-CM

## 2015-10-29 MED ORDER — SILDENAFIL CITRATE 20 MG PO TABS: 60.0000 mg | ORAL_TABLET | Freq: Every day | ORAL | 11 refills | Status: DC | PRN

## 2015-10-29 NOTE — Progress Notes (Signed)
Subjective:    Patient ID: James Camacho, male    DOB: Jul 11, 1950, 65 y.o.   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 amLODipine (NORVASC) 10 MG tablet TAKE 1 TABLET (10 MG TOTAL) BY MOUTH DAILY. 90 tablet 0  . carvedilol (COREG) 12.5 MG tablet TAKE 1 TABLET (12.5 MG TOTAL) BY MOUTH 2 (TWO) TIMES DAILY WITH A MEAL. 180 tablet 0  . citalopram (CELEXA) 20 MG tablet Take 1 tablet (20 mg total) by mouth daily. 90 tablet 0  . furosemide (LASIX) 40 MG tablet TAKE 1 TABLET (40 MG TOTAL) BY MOUTH DAILY. 30 tablet 0  . lisinopril (PRINIVIL,ZESTRIL) 40 MG tablet Take 1 tablet (40 mg total) by mouth daily. 90 tablet 0   No current facility-administered medications on file prior to visit.     Allergies  Allergen Reactions  . Other     Pt can only take Tylenol for pain- anything prescribed that he has tried breaks him out in a rash.   . Penicillins Rash  . Shellfish Allergy Hives    Past Medical History:  Diagnosis Date  . Anxiety   . Depression   . ED (erectile dysfunction)   . H/O seasonal allergies   . Hyperlipidemia    LDL in 120's  . Hypertension     Past Surgical History:  Procedure Laterality Date  . APPENDECTOMY     with chole  . CARDIOVASCULAR STRESS TEST  02/09   negative  . CHOLECYSTECTOMY     done by Dr Sharlet Salina in 1980's    Family History  Problem Relation Age of Onset  . Diabetes Mother   . Arthritis Mother   . Hypertension Mother   . Chronic Renal Failure Mother   . Parkinsonism  Father   . Heart disease Father   . Cancer Neg Hx     no colon or prostate cancer  . Colon cancer Neg Hx   . Esophageal cancer Neg Hx   . Stomach cancer Neg Hx   . Rectal cancer Neg Hx     Social History   Social History  . Marital status: Married    Spouse name: N/A  . Number of children: 3  . Years of education: N/A   Occupational History  . 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 [employer]     Part time   Social History Main Topics  . Smoking status: Never Smoker  . Smokeless tobacco: Never Used  . Alcohol use Yes     Comment: rare  . Drug use: No  . Sexual activity: Not on file   Other Topics Concern  . Not on file   Social History Narrative   Tries to walk   Review of Systems  Constitutional: Negative for fatigue and unexpected weight change.       Wears seat belt  HENT: Negative for dental problem, hearing loss and tinnitus.        Keeps up with dentist  Eyes: Negative for visual disturbance.       No diplopia or unilateral vision loss  Gastrointestinal: Negative for abdominal pain, blood in stool, constipation, nausea and vomiting.  Endocrine: Negative for polydipsia and polyuria.  Genitourinary: Negative for urgency.       Occ mild dribbling Satisfied with med for ED--will change to generic  Musculoskeletal: Negative for arthralgias, back pain and joint swelling.  Skin:       Slight elbow rash Has med for this from derm (looks eczematous)  Allergic/Immunologic: Negative for environmental allergies and immunocompromised state.  Neurological: Negative for dizziness, syncope, weakness, light-headedness and headaches.  Hematological: Negative for adenopathy. Does not bruise/bleed easily.  Psychiatric/Behavioral: Negative for dysphoric mood and sleep disturbance. The patient is nervous/anxious.        Bad snoring but no clear apnea. Some increased somnolence---needs to reschedule sleep study       Objective:     Physical Exam  Constitutional: He is oriented to person, place, and time. He appears well-developed and well-nourished. No distress.  HENT:  Head: Normocephalic and atraumatic.  Right Ear: External ear normal.  Left Ear: External ear normal.  Mouth/Throat: Oropharynx is clear and moist. No oropharyngeal exudate.  Eyes: Conjunctivae are normal. Pupils are equal, round, and reactive to light.  Neck: Normal range of motion. Neck supple. No thyromegaly present.  Cardiovascular: Normal rate, regular rhythm, normal heart sounds and intact distal pulses.  Exam reveals no gallop.   No murmur heard. Pulmonary/Chest: Effort normal and breath sounds normal. No respiratory distress. He has no wheezes. He has no rales.  Abdominal: Soft. There is no tenderness.  Musculoskeletal: He exhibits no edema or tenderness.  Lymphadenopathy:    He has no cervical adenopathy.  Neurological: He is alert and oriented to person, place, and time.  Skin: No rash noted. No erythema.  Psychiatric: He has a normal mood and affect. His behavior is normal.          Assessment & Plan:

## 2015-10-29 NOTE — Progress Notes (Signed)
Pre visit review using our clinic review tool, if applicable. No additional management support is needed unless otherwise documented below in the visit note. 

## 2015-10-29 NOTE — Assessment & Plan Note (Signed)
Doing better since legal problems mostly dealt with Will continue the citalopram

## 2015-10-29 NOTE — Assessment & Plan Note (Signed)
Needs to work on fitness No PSA due to no insurance Colon due 2019

## 2015-10-29 NOTE — Assessment & Plan Note (Signed)
BP Readings from Last 3 Encounters:  10/29/15 110/60  07/25/14 132/80  03/26/14 110/60   Good control May be able to wean meds over time No labs since no insurance--did have when in prison

## 2015-12-17 ENCOUNTER — Other Ambulatory Visit: Payer: Self-pay | Admitting: Internal Medicine

## 2016-04-10 ENCOUNTER — Other Ambulatory Visit: Payer: Self-pay | Admitting: Internal Medicine

## 2016-05-17 ENCOUNTER — Telehealth: Payer: Self-pay | Admitting: Internal Medicine

## 2016-05-17 NOTE — Telephone Encounter (Signed)
[facility]  Patient Name: James Camacho  DOB: 1950/05/05    Initial Comment Caller states he has hip and leg pain, took Aleve, has broken out like when he has a reaction to shellfish   Nurse Assessment  Nurse: Harlow Mares, RN, Suanne Marker Date/Time (Eastern Time): 05/17/2016 2:11:36 PM  Confirm and document reason for call. If symptomatic, describe symptoms. ---Caller states he has hip and leg pain, took Aleve, has broken out like when he has a reaction to shellfish. Hives. Took Aleve on Sunday and awoke Sunday night with the hives. Has taken Benadryl. He is very itchy all over.  Does the patient have any new or worsening symptoms? ---Yes  Will a triage be completed? ---Yes  Related visit to physician within the last 2 weeks? ---No  Does the PT have any chronic conditions? (i.e. diabetes, asthma, etc.) ---Yes  List chronic conditions. ---allergy to shellfish; previous reaction to tylenol; HTN; takes a fluid pill also  Is this a behavioral health or substance abuse call? ---No     Guidelines    Guideline Title Affirmed Question Affirmed Notes  Hives [1] MODERATE-SEVERE hives persist (i.e., hives interfere with normal activities or work) AND [2] taking antihistamine (e.g., Benadryl, Claritin) > 24 hours    Final Disposition User   See Physician within [place], RN, Suanne Marker    Comments  Scheduled caller for appt with Alma Friendly, FNP for tomorrow at 10:45am at the [facility] location.l   Referrals  REFERRED TO PCP OFFICE   Disagree/Comply: Comply

## 2016-05-17 NOTE — Telephone Encounter (Signed)
Pt has appt with Allie Bossier NP on 05/18/16 at 10:45.

## 2016-05-17 NOTE — Telephone Encounter (Signed)
Noted  

## 2016-05-18 ENCOUNTER — Ambulatory Visit (INDEPENDENT_AMBULATORY_CARE_PROVIDER_SITE_OTHER): Payer: Self-pay | Admitting: Primary Care

## 2016-05-18 ENCOUNTER — Encounter: Payer: Self-pay | Admitting: Primary Care

## 2016-05-18 VITALS — BP 128/80 | HR 74 | Temp 97.9°F | Ht 68.0 in | Wt 257.8 lb

## 2016-05-18 DIAGNOSIS — M5441 Lumbago with sciatica, right side: Secondary | ICD-10-CM

## 2016-05-18 DIAGNOSIS — Z91018 Allergy to other foods: Secondary | ICD-10-CM

## 2016-05-18 DIAGNOSIS — G8929 Other chronic pain: Secondary | ICD-10-CM

## 2016-05-18 DIAGNOSIS — M545 Low back pain, unspecified: Secondary | ICD-10-CM | POA: Insufficient documentation

## 2016-05-18 DIAGNOSIS — L509 Urticaria, unspecified: Secondary | ICD-10-CM

## 2016-05-18 DIAGNOSIS — R21 Rash and other nonspecific skin eruption: Secondary | ICD-10-CM

## 2016-05-18 MED ORDER — PREDNISONE 20 MG PO TABS: ORAL_TABLET | ORAL | 0 refills | Status: DC

## 2016-05-18 MED ORDER — EPINEPHRINE 0.3 MG/0.3ML IJ SOAJ: INTRAMUSCULAR | 0 refills | Status: DC

## 2016-05-18 MED ORDER — TRIAMCINOLONE ACETONIDE 0.5 % EX OINT: 1.0000 "application " | TOPICAL_OINTMENT | Freq: Two times a day (BID) | CUTANEOUS | 0 refills | Status: DC

## 2016-05-18 NOTE — Progress Notes (Signed)
Pre visit review using our clinic review tool, if applicable. No additional management support is needed unless otherwise documented below in the visit note. 

## 2016-05-18 NOTE — Patient Instructions (Signed)
Start Prednisone tablets for hives. Take 3 tablets for two days, then 2 tablets for two days, then 1 tablet for three days.  You may apply the triamcinolone ointment twice daily for 1-2 weeks. Do not apply this to your face. Wash your hands after application.  I sent a prescription for the EpiPen to your pharmacy.  Your hip and back pain are most likely arthritis. Weight loss through healthy diet and exercise will help. Application of heat/ice. Take a look at the information below.  Do not exceed 3000 mg of Tylenol in 24 hours. You could try Tylenol Arthritis. Do not take Aleve, naproxen, Motrin, ibuprofen, Advil.  Please call us if no improvement in your rash by Friday this week.   It was a pleasure meeting you!   Arthritis Arthritis is a term that is commonly used to refer to joint pain or joint disease. There are more than 100 types of arthritis. What are the causes? The most common cause of this condition is wear and tear of a joint. Other causes include:  Gout.  Inflammation of a joint.  An infection of a joint.  Sprains and other injuries near the joint.  A drug reaction or allergic reaction. In some cases, the cause may not be known. What are the signs or symptoms? The main symptom of this condition is pain in the joint with movement. Other symptoms include:  Redness, swelling, or stiffness at a joint.  Warmth coming from the joint.  Fever.  Overall feeling of illness. How is this diagnosed? This condition may be diagnosed with a physical exam and tests, including:  Blood tests.  Urine tests.  Imaging tests, such as MRI, X-rays, or a CT scan. Sometimes, fluid is removed from a joint for testing. How is this treated? Treatment for this condition may involve:  Treatment of the cause, if it is known.  Rest.  Raising (elevating) the joint.  Applying cold or hot packs to the joint.  Medicines to improve symptoms and reduce inflammation.  Injections of a  steroid such as cortisone into the joint to help reduce pain and inflammation. Depending on the cause of your arthritis, you may need to make lifestyle changes to reduce stress on your joint. These changes may include exercising more and losing weight. Follow these instructions at home: Medicines   Take over-the-counter and prescription medicines only as told by your health care provider.  Do not take aspirin to relieve pain if gout is suspected. Activity   Rest your joint if told by your health care provider. Rest is important when your disease is active and your joint feels painful, swollen, or stiff.  Avoid activities that make the pain worse. It is important to balance activity with rest.  Exercise your joint regularly with range-of-motion exercises as told by your health care provider. Try doing low-impact exercise, such as:  Swimming.  Water aerobics.  Biking.  Walking. Joint Care    If your joint is swollen, keep it elevated if told by your health care provider.  If your joint feels stiff in the morning, try taking a warm shower.  If directed, apply heat to the joint. If you have diabetes, do not apply heat without permission from your health care provider.  Put a towel between the joint and the hot pack or heating pad.  Leave the heat on the area for 20-30 minutes.  If directed, apply ice to the joint:  Put ice in a plastic bag.  Place a  towel between your skin and the bag.  Leave the ice on for 20 minutes, 2-3 times per day.  Keep all follow-up visits as told by your health care provider. This is important. Contact a health care provider if:  The pain gets worse.  You have a fever. Get help right away if:  You develop severe joint pain, swelling, or redness.  Many joints become painful and swollen.  You develop severe back pain.  You develop severe weakness in your leg.  You cannot control your bladder or bowels. This information is not intended  to replace advice given to you by your health care provider. Make sure you discuss any questions you have with your health care provider. Document Released: 03/31/2004 Document Revised: 07/30/2015 Document Reviewed: 05/19/2014 Elsevier Interactive Patient Education  2017 [employer].

## 2016-05-18 NOTE — Assessment & Plan Note (Signed)
Chronic for 3-4 months. Suspect arthritis as cause.  Prednisone course provided for hives, this may also help with sciatica.  He declines xray today as he is self pay. Recommended heat/ice, weight reduction, low impact exercise.

## 2016-05-18 NOTE — Progress Notes (Signed)
Subjective:    Patient ID: James Camacho, male    DOB: 02-08-1951, 66 y.o.   MRN: [id number]  HPI  James Camacho is a 66 year old male who presents today with a chief complaint of hives. He has a history of chronic right hip/lower back pain with radiation of pain down his right lower extremity. He's been taking 2000-3000 mg once several days weekly for the past 1 month. Sunday this week he switched to Aleve. Six hours after taking Aleve he noticed itching and hives to his neck, scratchy throat. He then took benadryl and applied OTC anti-itch cream, the anti-itch cream made his symptoms worse. Monday morning he woke up with increased hives to trunk, upper and lower extremities.   He was once provided with Epi Pen to use PRN for anaphylaxis as he has a history of food allergies and environmental allergies. He was tested 5 years ago per allergist.   He denies shortness of breath, wheezing, nausea/vomiting, throat tightness, chest pain. Overall his hives are about the same.  2) Chronic Hip/Back Pain: Located to right lower back/right hip that been present for the past 3-4 months. He stands a lot most of his day at work. His pain progresses throughout the day. His pain will radiate down the right lower extremity with stiffness. He experiences a lot of pain after rising when sitting for a prolonged period of time. He denies injury/trauma, numbness/tingling. He lays on a vibrating heating pad in the evening with improvement.   Review of Systems  HENT: Negative for trouble swallowing.   Respiratory: Negative for cough, chest tightness and shortness of breath.   Cardiovascular: Negative for chest pain.  Musculoskeletal: Positive for back pain.  Skin: Positive for rash.  Neurological: Negative for numbness.       Past Medical History:  Diagnosis Date  . Anxiety   . Depression   . ED (erectile dysfunction)   . H/O seasonal allergies   . Hyperlipidemia    LDL in 120's  . Hypertension      Social History   Social History  . Marital status: Married    Spouse name: N/A  . Number of children: 3  . Years of education: N/A   Occupational History  . 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 [employer]     Part time   Social History Main Topics  . Smoking status: Never Smoker  . Smokeless tobacco: Never Used  . Alcohol use Yes     Comment: rare  . Drug use: No  . Sexual activity: Not on file   Other Topics Concern  . Not on file   Social History Narrative   Tries to walk    Past Surgical History:  Procedure Laterality Date  . APPENDECTOMY     with chole  . CARDIOVASCULAR STRESS TEST  02/09   negative  . CHOLECYSTECTOMY     done by Dr Sharlet Salina in 1980's    Family History  Problem Relation Age of Onset  . Diabetes Mother   . Arthritis Mother   . Hypertension Mother   . Chronic Renal Failure Mother   . Parkinsonism Father   . Heart disease Father   . Cancer Neg Hx     no colon or prostate cancer  . Colon cancer Neg Hx   . Esophageal cancer Neg Hx   . Stomach cancer Neg Hx   . Rectal cancer Neg Hx  Allergies  Allergen Reactions  . Aleve [Naproxen Sodium] Hives  . Other     Pt can only take Tylenol for pain- anything prescribed that he has tried breaks him out in a rash.   . Penicillins Rash  . Shellfish Allergy Hives    Current Outpatient Prescriptions on File Prior to Visit  Medication Sig Dispense Refill  . amLODipine (NORVASC) 10 MG tablet TAKE ONE TABLET BY MOUTH ONCE DAILY 90 tablet 3  . carvedilol (COREG) 12.5 MG tablet TAKE ONE TABLET BY MOUTH TWICE DAILY WITH MEALS 180 tablet 2  . citalopram (CELEXA) 20 MG tablet Take 1 tablet (20 mg total) by mouth daily. 90 tablet 0  . furosemide (LASIX) 40 MG tablet TAKE ONE TABLET BY MOUTH ONCE DAILY 90 tablet 3  . lisinopril (PRINIVIL,ZESTRIL) 40 MG tablet TAKE ONE TABLET BY MOUTH ONCE DAILY 90 tablet 3  . sildenafil (REVATIO) 20 MG tablet Take 3-5  tablets (60-100 mg total) by mouth daily as needed. 50 tablet 11   No current facility-administered medications on file prior to visit.     BP 128/80   Pulse 74   Temp 97.9 F (36.6 C) (Oral)   Ht 5\' 8"  (1.727 m)   Wt 257 lb 12.8 oz (116.9 kg)   SpO2 95%   BMI 39.20 kg/m    Objective:   Physical Exam  Constitutional: He appears well-nourished.  HENT:  Right Ear: Tympanic membrane and ear canal normal.  Left Ear: Tympanic membrane and ear canal normal.  Nose: No mucosal edema. Right sinus exhibits no maxillary sinus tenderness and no frontal sinus tenderness. Left sinus exhibits no maxillary sinus tenderness and no frontal sinus tenderness.  Mouth/Throat: Oropharynx is clear and moist.  Eyes: Conjunctivae are normal.  Neck: Neck supple.  Cardiovascular: Normal rate and regular rhythm.   Pulmonary/Chest: Effort normal and breath sounds normal. He has no wheezes. He has no rales.  Skin: Skin is warm and dry.  Several large, raised, whelps to anterior and posterior trunk, upper and lower extremities. No open wounds. Non tender. Representative of allergic reaction.          Assessment & Plan:  Allergic Reaction:  Hives after taking Aleve. Temporary improvement with benadryl, no resolve. Exam today consistent for allergic reaction, he is stable.  Rx for Prednisone taper sent to pharmacy. Consider daily antihistamine after completion of Prednisone. Rx for Epi Pen provided to use PRN. He will call in 24*48 hours if no improvement in hives. Strict return precautions provided. Aleve added to allergy list.  Sheral Flow, NP

## 2016-06-24 ENCOUNTER — Other Ambulatory Visit: Payer: Self-pay | Admitting: Primary Care

## 2016-06-24 DIAGNOSIS — R21 Rash and other nonspecific skin eruption: Secondary | ICD-10-CM

## 2016-06-24 NOTE — Telephone Encounter (Signed)
Ok to refill? Electronically refill request for triamcinolone ointment (KENALOG) 0.5 %. Last prescribed and seen  on 05/18/2016. Dr Alla German patient.

## 2016-06-24 NOTE — Telephone Encounter (Signed)
Is he still experiencing symptoms of hives? If so, would recommend office visit for PCP evaluation.

## 2016-06-28 ENCOUNTER — Other Ambulatory Visit: Payer: Self-pay | Admitting: Primary Care

## 2016-06-28 DIAGNOSIS — R21 Rash and other nonspecific skin eruption: Secondary | ICD-10-CM

## 2016-06-28 MED ORDER — CLOBETASOL PROPIONATE 0.05 % EX OINT: 1.0000 "application " | TOPICAL_OINTMENT | Freq: Two times a day (BID) | CUTANEOUS | 0 refills | Status: DC

## 2016-06-28 NOTE — Telephone Encounter (Signed)
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 Pt request cb.[facility] [address].

## 2016-06-28 NOTE — Telephone Encounter (Signed)
Please notify patient that I sent a stronger potency ointment to his pharmacy called clobetasol ointment. This should work better than the triamcinolone. Have him update [place] if no improvement.

## 2016-06-28 NOTE — Telephone Encounter (Signed)
Spoken to patient. Yes, he still experiencing symptoms of hives. He stated that he cannot afford to come back in. He does not have insurance. He will try something over the counter.

## 2016-06-28 NOTE — Telephone Encounter (Signed)
See phone note. Is he having hives or psoriasis? The phone call stated the triamcinolone was helping with the psoriasis. Please clarify what's going on. Did he have any improvement with the prednisone?

## 2016-06-28 NOTE — Telephone Encounter (Signed)
Message left for patient to return my call.  

## 2016-06-30 NOTE — Telephone Encounter (Signed)
Spoken and notified patient of Kate's comments. Patient verbalized understanding. 

## 2016-08-26 ENCOUNTER — Encounter: Payer: Self-pay | Admitting: Internal Medicine

## 2016-09-10 ENCOUNTER — Other Ambulatory Visit: Payer: Self-pay | Admitting: Primary Care

## 2016-09-10 DIAGNOSIS — R21 Rash and other nonspecific skin eruption: Secondary | ICD-10-CM

## 2016-09-12 NOTE — Telephone Encounter (Signed)
Ok to refill? Electronically refill request for clobetasol ointment (TEMOVATE) 0.05 %.  Last prescribed by Anda Kraft on 06/28/2016. Last seen for acute on 05/17/2016

## 2016-09-12 NOTE — Telephone Encounter (Signed)
Approved: #30gm x 1

## 2016-10-31 ENCOUNTER — Encounter: Payer: Self-pay | Admitting: Internal Medicine

## 2016-12-21 ENCOUNTER — Other Ambulatory Visit: Payer: Self-pay | Admitting: Internal Medicine

## 2017-01-04 ENCOUNTER — Encounter: Payer: Self-pay | Admitting: Internal Medicine

## 2017-01-04 ENCOUNTER — Ambulatory Visit (INDEPENDENT_AMBULATORY_CARE_PROVIDER_SITE_OTHER): Payer: Self-pay | Admitting: Internal Medicine

## 2017-01-04 VITALS — BP 110/82 | HR 75 | Temp 98.2°F | Ht 68.0 in | Wt 247.0 lb

## 2017-01-04 DIAGNOSIS — F39 Unspecified mood [affective] disorder: Secondary | ICD-10-CM

## 2017-01-04 DIAGNOSIS — I10 Essential (primary) hypertension: Secondary | ICD-10-CM

## 2017-01-04 DIAGNOSIS — E1165 Type 2 diabetes mellitus with hyperglycemia: Secondary | ICD-10-CM

## 2017-01-04 DIAGNOSIS — E1159 Type 2 diabetes mellitus with other circulatory complications: Secondary | ICD-10-CM | POA: Insufficient documentation

## 2017-01-04 MED ORDER — METFORMIN HCL 1000 MG PO TABS: 1000.0000 mg | ORAL_TABLET | Freq: Two times a day (BID) | ORAL | 3 refills | Status: DC

## 2017-01-04 NOTE — Progress Notes (Signed)
Subjective:    Patient ID: James Camacho, male    DOB: 23-Jul-1950, 66 y.o.   MRN: [id number]  HPI Here for follow up  No insurance until March [facility])  Did just have urgent care visit Diagnosed with urinary tract infection---back pain, urgency, frequency Had fungal infection also Had testing done Symptoms resolved now with the antibiotic Now back to sleeping all day  Was diagnosed with diabetes though A1c was 11% Started on metformin 500mg  bid Checking tid ---- 178-270's No side effects  No sugared drinks Was eating sweets--has cut them out Wife diabetic so they are aware of proper eating Trying to walk--and has treadmill and elliptical  Current Outpatient Prescriptions on File Prior to Visit  Medication Sig Dispense Refill  . amLODipine (NORVASC) 10 MG tablet TAKE ONE TABLET BY MOUTH ONCE DAILY 30 tablet 0  . carvedilol (COREG) 12.5 MG tablet TAKE ONE TABLET BY MOUTH TWICE DAILY WITH MEALS 180 tablet 2  . clobetasol ointment (TEMOVATE) 0.05 % APPLY  OINTMENT TOPICALLY TWICE DAILY 30 g 1  . EPINEPHrine 0.3 mg/0.3 mL IJ SOAJ injection Use as directed. 1 Device 0  . furosemide (LASIX) 40 MG tablet TAKE ONE TABLET BY MOUTH ONCE DAILY 90 tablet 3  . lisinopril (PRINIVIL,ZESTRIL) 40 MG tablet TAKE ONE TABLET BY MOUTH ONCE DAILY 30 tablet 0  . sildenafil (REVATIO) 20 MG tablet Take 3-5 tablets (60-100 mg total) by mouth daily as needed. 50 tablet 11  . triamcinolone ointment (KENALOG) 0.5 % Apply 1 application topically 2 (two) times daily. 30 g 0   No current facility-administered medications on file prior to visit.     Allergies  Allergen Reactions  . Aleve [Naproxen Sodium] Hives  . Other     Pt can only take Tylenol for pain- anything prescribed that he has tried breaks him out in a rash.   . Penicillins Rash  . Shellfish Allergy Hives    Past Medical History:  Diagnosis Date  . Anxiety   . Depression   . ED (erectile dysfunction)   . H/O seasonal  allergies   . Hyperlipidemia    LDL in 120's  . Hypertension     Past Surgical History:  Procedure Laterality Date  . APPENDECTOMY     with chole  . CARDIOVASCULAR STRESS TEST  02/09   negative  . CHOLECYSTECTOMY     done by Dr Sharlet Salina in 1980's    Family History  Problem Relation Age of Onset  . Diabetes Mother   . Arthritis Mother   . Hypertension Mother   . Chronic Renal Failure Mother   . Parkinsonism Father   . Heart disease Father   . Cancer Neg Hx        no colon or prostate cancer  . Colon cancer Neg Hx   . Esophageal cancer Neg Hx   . Stomach cancer Neg Hx   . Rectal cancer Neg Hx     Social History   Social History  . Marital status: Married    Spouse name: N/A  . Number of children: 3  . Years of education: N/A   Occupational History  . 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 Recruiting--agents for insurance     full time   Social History Main Topics  . Smoking status: Never Smoker  . Smokeless tobacco: Never Used  . Alcohol use Yes     Comment: rare  . Drug use: No  .  Sexual activity: Not on file   Other Topics Concern  . Not on file   Social History Narrative   Tries to walk   Review of Systems  Sleeps well Has lost 10# Still taking buspirone--not sure he needs it     Objective:   Physical Exam  Constitutional: No distress.  Neck: No thyromegaly present.  Cardiovascular: Normal rate, regular rhythm, normal heart sounds and intact distal pulses.  Exam reveals no gallop.   No murmur heard. Pulmonary/Chest: Effort normal and breath sounds normal. No respiratory distress. He has no wheezes. He has no rales.  Musculoskeletal: He exhibits no edema.  Lymphadenopathy:    He has no cervical adenopathy.  Skin: No rash noted. No erythema.  No foot lesions  Psychiatric: He has a normal mood and affect. His behavior is normal.          Assessment & Plan:

## 2017-01-04 NOTE — Assessment & Plan Note (Signed)
Needs to work on fitness Increase metformin to 1000 bid Labs at visit in March Eye exam Feet okay

## 2017-01-04 NOTE — Patient Instructions (Signed)
Just check fasting sugars in the morning. Increase the metformin to 1000mg  twice a day. Let me know after 2 weeks if you morning fasting sugars are still over 200 regularly.

## 2017-01-04 NOTE — Assessment & Plan Note (Signed)
Chronic anxiety is better now Will try off all meds

## 2017-01-04 NOTE — Assessment & Plan Note (Signed)
BP Readings from Last 3 Encounters:  01/04/17 110/82  05/18/16 128/80  10/29/15 110/60   Good control

## 2017-01-11 ENCOUNTER — Telehealth: Payer: Self-pay | Admitting: Internal Medicine

## 2017-01-11 NOTE — Telephone Encounter (Signed)
Phone call from pt.  Stated he started Metformin approx. 2 weeks ago and has changed his diet also.  Reported he has been having intermittent acid reflux, that can occur during day, but seems worse at night.  Reported he has been taking Tums or Rolaids when the acid reflux occurs.  Reported he typically eats no later than 7:00 PM, prior to going to bed at 10:00-11:00 PM.  Reported his dose of Metformin was increased to 1000 mg BID, when he saw Dr. Silvio Pate last.    Questioned about how he is taking the Metformin; reported he takes 1 tab with breakfast, and 1 tab with supper.  Reported his blood sugars are 160-170 fasting, and in the 160 range at suppertime.  Reported he has had a lower reading of 138, on occasion.  Voiced concern about the possibility of a drop in his blood sugar, with taking the higher dose of Metformin.  Advised to be alert to s/s of low blood sugar, and to monitor/ record blood sugar readings.  Advised that Metformin can cause GI symptoms with diarrhea, flatulence, and indigestion. Informed pt. will make Dr. Silvio Pate aware of the symptom of acid reflux with Metformin.  Pt. verb. understanding and agreed.

## 2017-01-12 NOTE — Telephone Encounter (Signed)
Please let him know that increased acid symptoms are not that common with metformin but are possible. Is it possible that the change in his diet is causing problems? He may want to try pepcid or zantac at bedtime. If he has ongoing problems, he can try cutting the metformin back to 500mg  bid to see if that helps (and then let me know)

## 2017-01-12 NOTE — Telephone Encounter (Signed)
Spoke to pt. He will try pepcid or zantac and let us know if he decreases the metformin.

## 2017-01-17 ENCOUNTER — Telehealth: Payer: Self-pay | Admitting: Internal Medicine

## 2017-01-17 NOTE — Telephone Encounter (Signed)
Requested an appointment for discomfort around his kidneys. Having some spasms at times.

## 2017-01-18 ENCOUNTER — Ambulatory Visit: Payer: Self-pay | Admitting: Family Medicine

## 2017-01-18 ENCOUNTER — Telehealth: Payer: Self-pay

## 2017-01-18 NOTE — Telephone Encounter (Signed)
No need to charge, it is up to him if he wants to reschedule.

## 2017-01-18 NOTE — Telephone Encounter (Signed)
Copied from [place]. Topic: Quick Communication - Appointment Cancellation >> Jan 18, 2017  8:33 AM Lennox Solders wrote: Pt cancel his appt  Route to department's [facility] pool. >> Jan 18, 2017  8:52 AM Helene Shoe, LPN wrote: Pt cancelled appt with Glenda Chroman FNP 01/18/17 at 10:15; do you want late cancellation chg and do you want pt to reschedule; pt was having discomfort in kidneys & spasms?

## 2017-01-18 NOTE — Telephone Encounter (Signed)
Noted  

## 2017-01-23 ENCOUNTER — Telehealth: Payer: Self-pay | Admitting: Internal Medicine

## 2017-01-23 ENCOUNTER — Other Ambulatory Visit: Payer: Self-pay

## 2017-01-23 MED ORDER — AMLODIPINE BESYLATE 10 MG PO TABS: 10.0000 mg | ORAL_TABLET | Freq: Every day | ORAL | 0 refills | Status: DC

## 2017-01-23 MED ORDER — LISINOPRIL 40 MG PO TABS: 40.0000 mg | ORAL_TABLET | Freq: Every day | ORAL | 0 refills | Status: DC

## 2017-01-23 NOTE — Telephone Encounter (Signed)
Copied from [place] [id number]. Topic: Inquiry >> Jan 23, 2017 11:26 AM Pricilla Handler wrote: Reason for CRM: Patient called requesting refills of amLODipine (NORVASC) 10 MG tablet and lisinopril (PRINIVIL,ZESTRIL) 40 MG tablet. Patient wants prescriptions sent to [facility]. Please call patient once prescriptions completed. Patient stated that he is completely out at this time.

## 2017-02-25 ENCOUNTER — Other Ambulatory Visit: Payer: Self-pay | Admitting: Internal Medicine

## 2017-03-07 DIAGNOSIS — Z9842 Cataract extraction status, left eye: Secondary | ICD-10-CM

## 2017-03-07 DIAGNOSIS — Z9841 Cataract extraction status, right eye: Secondary | ICD-10-CM

## 2017-03-07 HISTORY — DX: Cataract extraction status, right eye: Z98.41

## 2017-03-07 HISTORY — DX: Cataract extraction status, left eye: Z98.42

## 2017-03-23 ENCOUNTER — Ambulatory Visit: Payer: Self-pay | Admitting: *Deleted

## 2017-03-23 NOTE — Telephone Encounter (Signed)
Pt  Denies  Any  Fever  yest  Has  Pain  Around  Face   With  Nasal  Drainage  And  Pain  When  He  Looks    Down  Has  Had  A  Sinus infection in past   He reports  He  Had tried  To get an  Anti biotic  Today   Without  Seeing  Dr  Silvio Pate  He  Was  Advised  He needed an  Appointment he  DECLINES  AN  APPOINTMENT . He  States  He has no insurance  . Pt  Was  Advised    That he   May  Consider a  Virtual  Visit  On my  Chart  And  He  Agrees  To  That . He  Was  Advised    If  He  Develops  Any  worsining  Of  Symptoms  That  He  Should  Be  Seen  Sooner  Or  Go to a ucc/  Er   Reason for Disposition . [1] Sinus pain (not just congestion) AND [2] fever  Answer Assessment - Initial Assessment Questions 1. LOCATION: "Where does it hurt?"        Face     And  Throat  MIDDLE   FACE    2. ONSET: "When did the sinus pain start?"  (e.g., hours, days)        About  4   Days  Ago   PT  REPORTS  PAIN   WHEN  HE  LOOKS  DOWN   3. SEVERITY: "How bad is the pain?"   (Scale 1-10; mild, moderate or severe)   - MILD (1-3): doesn't interfere with normal activities    - MODERATE (4-7): interferes with normal activities (e.g., work or school) or awakens from sleep   - SEVERE (8-10): excruciating pain and patient unable to do any normal activities          Mod  5    4. RECURRENT SYMPTOM: "Have you ever had sinus problems before?" If so, ask: "When was the last time?" and "What happened that time?"       Yes  Years  Ago   Went  To  Doctor  And  recived  Anti  Biotic   5. NASAL CONGESTION: "Is the nose blocked?" If so, ask, "Can you open it or must you breathe through the mouth?"       No 6. NASAL DISCHARGE: "Do you have discharge from your nose?" If so ask, "What color?"     Early  Am   Milky  Clear   7. FEVER: "Do you have a fever?" If so, ask: "What is it, how was it measured, and when did it start?"      NO   8. OTHER SYMPTOMS: "Do you have any other symptoms?" (e.g., sore throat, cough, earache, difficulty  breathing)     SCRATCHY    DRY  NON  PRODUCTIVE  COUGH   9. PREGNANCY: "Is there any chance you are pregnant?" "When was your last menstrual period?"     N/A  Protocols used: SINUS PAIN OR CONGESTION-A-AH

## 2017-04-27 ENCOUNTER — Other Ambulatory Visit: Payer: Self-pay | Admitting: Internal Medicine

## 2017-05-07 DIAGNOSIS — J3089 Other allergic rhinitis: Secondary | ICD-10-CM | POA: Diagnosis not present

## 2017-05-17 ENCOUNTER — Telehealth: Payer: Self-pay | Admitting: Internal Medicine

## 2017-05-17 DIAGNOSIS — H2513 Age-related nuclear cataract, bilateral: Secondary | ICD-10-CM | POA: Diagnosis not present

## 2017-05-17 MED ORDER — FUROSEMIDE 40 MG PO TABS: 40.0000 mg | ORAL_TABLET | Freq: Every day | ORAL | 0 refills | Status: DC

## 2017-05-17 NOTE — Telephone Encounter (Signed)
Called to speak to pt because I do not ever fill "ALL" medications. Come to find out he only needed furosemide. He is aware he needs a [employer] exam. I will have Ebony Hail get with him.

## 2017-05-17 NOTE — Telephone Encounter (Signed)
Copied from [place] [id number]. Topic: Quick Communication - See Telephone Encounter >> May 17, 2017  3:34 PM Lolita Rieger, RMA wrote: CRM for notification. See Telephone encounter for:   05/17/17.pt called and stated that he now has insurance and had to change his pharmacy pt stated that he needs all of his medications called in to [facility] [address] Please contact [phone] once this has been completed

## 2017-05-29 ENCOUNTER — Encounter: Payer: Self-pay | Admitting: Internal Medicine

## 2017-06-19 DIAGNOSIS — H2513 Age-related nuclear cataract, bilateral: Secondary | ICD-10-CM | POA: Diagnosis not present

## 2017-06-19 LAB — HM DIABETES EYE EXAM

## 2017-06-20 ENCOUNTER — Ambulatory Visit (INDEPENDENT_AMBULATORY_CARE_PROVIDER_SITE_OTHER): Payer: Medicare HMO | Admitting: Internal Medicine

## 2017-06-20 ENCOUNTER — Encounter: Payer: Self-pay | Admitting: Internal Medicine

## 2017-06-20 VITALS — BP 118/66 | HR 72 | Temp 97.8°F | Ht 68.0 in | Wt 246.0 lb

## 2017-06-20 DIAGNOSIS — I10 Essential (primary) hypertension: Secondary | ICD-10-CM | POA: Diagnosis not present

## 2017-06-20 DIAGNOSIS — E1165 Type 2 diabetes mellitus with hyperglycemia: Secondary | ICD-10-CM

## 2017-06-20 DIAGNOSIS — R21 Rash and other nonspecific skin eruption: Secondary | ICD-10-CM

## 2017-06-20 DIAGNOSIS — G479 Sleep disorder, unspecified: Secondary | ICD-10-CM

## 2017-06-20 DIAGNOSIS — Z125 Encounter for screening for malignant neoplasm of prostate: Secondary | ICD-10-CM | POA: Diagnosis not present

## 2017-06-20 DIAGNOSIS — Z0000 Encounter for general adult medical examination without abnormal findings: Secondary | ICD-10-CM | POA: Diagnosis not present

## 2017-06-20 DIAGNOSIS — Z7189 Other specified counseling: Secondary | ICD-10-CM | POA: Diagnosis not present

## 2017-06-20 DIAGNOSIS — Z23 Encounter for immunization: Secondary | ICD-10-CM | POA: Diagnosis not present

## 2017-06-20 LAB — COMPREHENSIVE METABOLIC PANEL
ALK PHOS: 54 U/L (ref 39–117)
ALT: 40 U/L (ref 0–53)
AST: 23 U/L (ref 0–37)
Albumin: 3.9 g/dL (ref 3.5–5.2)
BUN: 11 mg/dL (ref 6–23)
CO2: 31 meq/L (ref 19–32)
Calcium: 8.8 mg/dL (ref 8.4–10.5)
Chloride: 101 mEq/L (ref 96–112)
Creatinine, Ser: 0.85 mg/dL (ref 0.40–1.50)
GFR: 96.12 mL/min (ref >60.00)
Glucose, Bld: 164 mg/dL — ABNORMAL HIGH (ref 70–99)
POTASSIUM: 4.1 meq/L (ref 3.5–5.1)
Sodium: 137 mEq/L (ref 135–145)
TOTAL PROTEIN: 6.5 g/dL (ref 6.0–8.3)
Total Bilirubin: 0.8 mg/dL (ref 0.2–1.2)

## 2017-06-20 LAB — PSA: PSA: 0.93 ng/mL (ref 0.10–4.00)

## 2017-06-20 LAB — CBC
HEMATOCRIT: 44.8 % (ref 39.0–52.0)
Hemoglobin: 15.2 g/dL (ref 13.0–17.0)
MCHC: 34 g/dL (ref 30.0–36.0)
MCV: 91 fl (ref 78.0–100.0)
PLATELETS: 200 10*3/uL (ref 150.0–400.0)
RBC: 4.92 Mil/uL (ref 4.22–5.81)
RDW: 13.5 % (ref 11.5–15.5)
WBC: 6.6 10*3/uL (ref 4.0–10.5)

## 2017-06-20 LAB — LIPID PANEL
Cholesterol: 139 mg/dL (ref 0–200)
HDL: 33.1 mg/dL — AB (ref >39.00)
LDL Cholesterol: 87 mg/dL (ref 0–99)
NONHDL: 105.98
TRIGLYCERIDES: 96 mg/dL (ref 0.0–149.0)
Total CHOL/HDL Ratio: 4
VLDL: 19.2 mg/dL (ref 0.0–40.0)

## 2017-06-20 LAB — HEMOGLOBIN A1C: Hgb A1c MFr Bld: 7.5 % — ABNORMAL HIGH (ref 4.6–6.5)

## 2017-06-20 LAB — HM DIABETES FOOT EXAM

## 2017-06-20 MED ORDER — CLOBETASOL PROPIONATE 0.05 % EX OINT: TOPICAL_OINTMENT | Freq: Two times a day (BID) | CUTANEOUS | 1 refills | Status: DC | PRN

## 2017-06-20 MED ORDER — ATORVASTATIN CALCIUM 10 MG PO TABS: 10.0000 mg | ORAL_TABLET | Freq: Every day | ORAL | 3 refills | Status: DC

## 2017-06-20 NOTE — Progress Notes (Signed)
Subjective:    Patient ID: James Camacho, male    DOB: [birth year], 67 y.o.   MRN: [id number]  HPI Here for initial Medicare preventative exam--and follow up of chronic health conditions Reviewed form and advanced directives Reviewed other doctors No alcohol or tobacco Tries to walk 3 times a week---but only 15-20 minutes (discussed increasing and resistance training) Vision is worsening--cataracts Hearing aides--- not that much help No falls No depression or anhedonia Independent with instrumental ADLs No sig memory issues  Ongoing sleep issues Stays tired all the time Had been scheduled for sleep study--cancelled it due to his legal issues Feels it needs to be redone  Finds the furosemide really causes urgency It does help with the fluid No chest pain/SOB No dizziness or syncope No palpitations No headaches  Sugars were dropping down under 70--checking daily Had some dizziness and lightheadedness Now checking 3 times a week--- 90-115 No foot numbness, sores or pain Not really following diabetic diet, etc. Wife is diabetic so they know what is right  Current Outpatient Medications on File Prior to Visit  Medication Sig Dispense Refill  . amLODipine (NORVASC) 10 MG tablet Take 1 tablet (10 mg total) by mouth daily. NEEDS OFFICE VISIT 90 tablet 0  . carvedilol (COREG) 12.5 MG tablet TAKE ONE TABLET BY MOUTH TWICE DAILY WITH MEALS 180 tablet 2  . clobetasol ointment (TEMOVATE) 0.05 % APPLY  OINTMENT TOPICALLY TWICE DAILY 30 g 1  . EPINEPHrine 0.3 mg/0.3 mL IJ SOAJ injection Use as directed. 1 Device 0  . furosemide (LASIX) 40 MG tablet Take 1 tablet (40 mg total) by mouth daily. 90 tablet 0  . lisinopril (PRINIVIL,ZESTRIL) 40 MG tablet TAKE ONE TABLET BY MOUTH EVERY DAY 90 tablet 0  . sildenafil (REVATIO) 20 MG tablet TAKE 3 TO 5 TABLETS DAILY AS DIRECTED 50 tablet 11   No current facility-administered medications on file prior to visit.     Allergies  Allergen  Reactions  . Aleve [Naproxen Sodium] Hives  . Other     Pt can only take Tylenol for pain- anything prescribed that he has tried breaks him out in a rash.   . Penicillins Rash  . Shellfish Allergy Hives    Past Medical History:  Diagnosis Date  . Anxiety   . Depression   . ED (erectile dysfunction)   . H/O seasonal allergies   . Hyperlipidemia    LDL in 120's  . Hypertension     Past Surgical History:  Procedure Laterality Date  . APPENDECTOMY     with chole  . CARDIOVASCULAR STRESS TEST  02/09   negative  . CHOLECYSTECTOMY     done by Dr Sharlet Salina in 1980's    Family History  Problem Relation Age of Onset  . Diabetes Mother   . Arthritis Mother   . Hypertension Mother   . Chronic Renal Failure Mother   . Parkinsonism Father   . Heart disease Father   . Cancer Neg Hx        no colon or prostate cancer  . Colon cancer Neg Hx   . Esophageal cancer Neg Hx   . Stomach cancer Neg Hx   . Rectal cancer Neg Hx     Social History   Socioeconomic History  . Marital status: Married    Spouse name: Not on file  . Number of children: 3  . Years of education: Not on file  . Highest education level: Not on file  Occupational History  .  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 Occupation: Engineer, site business    Comment:    Social Needs  . Financial resource strain: Not on file  . Food insecurity:    Worry: Not on file    Inability: Not on file  . Transportation needs:    Medical: Not on file    Non-medical: Not on file  Tobacco Use  . Smoking status: Never Smoker  . Smokeless tobacco: Never Used  Substance and Sexual Activity  . Alcohol use: Yes    Comment: rare  . Drug use: No  . Sexual activity: Not on file  Lifestyle  . Physical activity:    Days per week: Not on file    Minutes per session: Not on file  . Stress: Not on file  Relationships  . Social connections:    Talks on phone: Not on  file    Gets together: Not on file    Attends religious service: Not on file    Active member of club or organization: Not on file    Attends meetings of clubs or organizations: Not on file    Relationship status: Not on file  . Intimate partner violence:    Fear of current or ex partner: Not on file    Emotionally abused: Not on file    Physically abused: Not on file    Forced sexual activity: Not on file  Other Topics Concern  . Not on file  Social History Narrative   Has living will   Wife is health care POA--- children are alternates   Would accept resuscitation   No prolonged tube feeds   Review of Systems Is due for cataract surgery soon Teeth okay--- keeps up with dentist Appetite is okay Weight stable--unfortunately Wears seat belt Psoriatic rash on elbows-- has med for this No heartburn or dysphagia No sig back or joint pains Bowels are fine--no blood Voids okay. Stream is fine--no nocturia Satisfied with the sildenafil    Objective:   Physical Exam  Constitutional: He is oriented to person, place, and time. He appears well-developed. No distress.  HENT:  Mouth/Throat: Oropharynx is clear and moist. No oropharyngeal exudate.  Neck: No thyromegaly present.  Cardiovascular: Normal rate, regular rhythm, normal heart sounds and intact distal pulses. Exam reveals no gallop.  No murmur heard. Pulmonary/Chest: Effort normal and breath sounds normal. No respiratory distress. He has no wheezes. He has no rales.  Abdominal: Soft. There is no tenderness.  Musculoskeletal: He exhibits no edema or tenderness.  Lymphadenopathy:    He has no cervical adenopathy.  Neurological: He is alert and oriented to person, place, and time.  President--- "Dwaine Deter, [place]" [phone] [id number] Recall 3/3  Normal sensation in feet  Skin: No erythema.  No foot lesions Slight scaling over elbows  Psychiatric: He has a normal mood and affect. His behavior is normal.           Assessment & Plan:

## 2017-06-20 NOTE — Assessment & Plan Note (Signed)
See social history 

## 2017-06-20 NOTE — Assessment & Plan Note (Signed)
I have personally reviewed the Medicare Annual Wellness questionnaire and have noted 1. The patient's medical and social history 2. Their use of alcohol, tobacco or illicit drugs 3. Their current medications and supplements 4. The patient's functional ability including ADL's, fall risks, home safety risks and hearing or visual             impairment. 5. Diet and physical activities 6. Evidence for depression or mood disorders  The patients weight, height, BMI and visual acuity have been recorded in the chart I have made referrals, counseling and provided education to the patient based review of the above and I have provided the pt with a written personalized care plan for preventive services.  I have provided you with a copy of your personalized plan for preventive services. Please take the time to review along with your updated medication list.  Will check PSA after discussion Due for colon---setting this up prevnar today Yearly flu vaccine Discussed lifestyle Consider shingrix

## 2017-06-20 NOTE — Assessment & Plan Note (Signed)
BP Readings from Last 3 Encounters:  06/20/17 118/66  01/04/17 110/82  05/18/16 128/80   Good control On ACEI

## 2017-06-20 NOTE — Assessment & Plan Note (Signed)
BMI 37 or so--plus diabetes and HTN and sleep problems Discussed goal of 5# every 6 months

## 2017-06-20 NOTE — Progress Notes (Signed)
Hearing Screening Comments: Has hearing aids Vision Screening Comments: April 2019

## 2017-06-20 NOTE — Assessment & Plan Note (Signed)
Will set up again with sleep specialist

## 2017-06-20 NOTE — Assessment & Plan Note (Signed)
Seems to be controlled now Will likely want him to restart metformin--but maybe in lower dose Start statin Needs eye exam

## 2017-06-20 NOTE — Addendum Note (Signed)
Addended by: Modena Nunnery on: 06/20/2017 09:05 AM   Modules accepted: Orders

## 2017-06-21 ENCOUNTER — Telehealth: Payer: Self-pay

## 2017-06-21 MED ORDER — METFORMIN HCL ER 500 MG PO TB24: 500.0000 mg | ORAL_TABLET | Freq: Every day | ORAL | 3 refills | Status: DC

## 2017-06-21 NOTE — Telephone Encounter (Signed)
Spoke to pt. He will start the metformin. I have sent in the rx.

## 2017-06-21 NOTE — Telephone Encounter (Signed)
Copied from [place] [phone] [id number]. Topic: Quick Communication - Office Called Patient >> Jun 21, 2017 11:37 AM Synthia Innocent wrote: Reason for CRM: Returning call back >> Jun 21, 2017 12:14 PM Waylan Rocher, [place] L wrote: Patient was talking to someone at the office but they hung up because he placed them on hold. Patient would like a call back

## 2017-06-22 DIAGNOSIS — H2511 Age-related nuclear cataract, right eye: Secondary | ICD-10-CM | POA: Diagnosis not present

## 2017-06-26 NOTE — Discharge Instructions (Signed)

## 2017-06-28 ENCOUNTER — Ambulatory Visit: Payer: Medicare HMO | Admitting: Anesthesiology

## 2017-06-28 ENCOUNTER — Ambulatory Visit
Admission: RE | Admit: 2017-06-28 | Discharge: 2017-06-28 | Disposition: A | Discharge status: Home / Self Care | Payer: Medicare HMO | Source: Ambulatory Visit | Attending: Ophthalmology | Admitting: Ophthalmology

## 2017-06-28 ENCOUNTER — Encounter
Admission: RE | Disposition: A | Discharge status: Home / Self Care | Payer: Self-pay | Source: Ambulatory Visit | Attending: Ophthalmology

## 2017-06-28 DIAGNOSIS — Z6837 Body mass index (BMI) 37.0-37.9, adult: Secondary | ICD-10-CM | POA: Insufficient documentation

## 2017-06-28 DIAGNOSIS — Z79899 Other long term (current) drug therapy: Secondary | ICD-10-CM | POA: Diagnosis not present

## 2017-06-28 DIAGNOSIS — H2511 Age-related nuclear cataract, right eye: Secondary | ICD-10-CM | POA: Insufficient documentation

## 2017-06-28 DIAGNOSIS — H25811 Combined forms of age-related cataract, right eye: Secondary | ICD-10-CM | POA: Diagnosis not present

## 2017-06-28 DIAGNOSIS — I10 Essential (primary) hypertension: Secondary | ICD-10-CM | POA: Diagnosis not present

## 2017-06-28 HISTORY — DX: Presence of external hearing-aid: Z97.4

## 2017-06-28 HISTORY — PX: CATARACT EXTRACTION W/PHACO: SHX586

## 2017-06-28 SURGERY — PHACOEMULSIFICATION, CATARACT, WITH IOL INSERTION
Anesthesia: Monitor Anesthesia Care | Site: Eye | Laterality: Right | Wound class: "Clean "

## 2017-06-28 MED ORDER — BRIMONIDINE TARTRATE-TIMOLOL 0.2-0.5 % OP SOLN
OPHTHALMIC | Status: DC | PRN
  Administered 2017-06-28: 1 [drp] via OPHTHALMIC

## 2017-06-28 MED ORDER — NA HYALUR & NA CHOND-NA HYALUR 0.4-0.35 ML IO KIT
PACK | INTRAOCULAR | Status: DC | PRN
  Administered 2017-06-28: 1 mL via INTRAOCULAR

## 2017-06-28 MED ORDER — ARMC OPHTHALMIC DILATING DROPS
1.0000 "application " | OPHTHALMIC | Status: DC | PRN
  Administered 2017-06-28 (×3): 1 via OPHTHALMIC

## 2017-06-28 MED ORDER — BSS IO SOLN
INTRAOCULAR | Status: DC | PRN
  Administered 2017-06-28: 61 mL via OPHTHALMIC

## 2017-06-28 MED ORDER — MIDAZOLAM HCL 2 MG/2ML IJ SOLN
INTRAMUSCULAR | Status: DC | PRN
  Administered 2017-06-28 (×2): 1 mg via INTRAVENOUS

## 2017-06-28 MED ORDER — ACETAMINOPHEN 325 MG PO TABS: 325.0000 mg | ORAL_TABLET | Freq: Once | ORAL | Status: DC

## 2017-06-28 MED ORDER — FENTANYL CITRATE (PF) 100 MCG/2ML IJ SOLN
INTRAMUSCULAR | Status: DC | PRN
  Administered 2017-06-28 (×2): 50 ug via INTRAVENOUS

## 2017-06-28 MED ORDER — ACETAMINOPHEN 160 MG/5ML PO SOLN: 325.0000 mg | Freq: Once | ORAL | Status: DC

## 2017-06-28 MED ORDER — CEFUROXIME OPHTHALMIC INJECTION 1 MG/0.1 ML
INJECTION | OPHTHALMIC | Status: DC | PRN
  Administered 2017-06-28: 0.1 mL via INTRACAMERAL

## 2017-06-28 MED ORDER — LIDOCAINE HCL (PF) 2 % IJ SOLN
INTRAOCULAR | Status: DC | PRN
  Administered 2017-06-28: 1 mL

## 2017-06-28 MED ORDER — MOXIFLOXACIN HCL 0.5 % OP SOLN
1.0000 [drp] | OPHTHALMIC | Status: DC | PRN
  Administered 2017-06-28 (×3): 1 [drp] via OPHTHALMIC

## 2017-06-28 MED ORDER — LACTATED RINGERS IV SOLN: INTRAVENOUS | Status: DC

## 2017-06-28 SURGICAL SUPPLY — 28 items
CANNULA ANT/CHMB 27G (MISCELLANEOUS) ×1 IMPLANT
CANNULA ANT/CHMB 27GA (MISCELLANEOUS) ×2 IMPLANT
CARTRIDGE ABBOTT (MISCELLANEOUS) IMPLANT
GLOVE SURG LX 7.5 STRW (GLOVE) ×1
GLOVE SURG LX STRL 7.5 STRW (GLOVE) ×1 IMPLANT
GLOVE SURG TRIUMPH 8.0 PF LTX (GLOVE) ×2 IMPLANT
GOWN STRL REUS W/ TWL LRG LVL3 (GOWN DISPOSABLE) ×2 IMPLANT
GOWN STRL REUS W/TWL LRG LVL3 (GOWN DISPOSABLE) ×2
LENS IOL TECNIS ITEC 22.5 (Intraocular Lens) ×1 IMPLANT
MARKER SKIN DUAL TIP RULER LAB (MISCELLANEOUS) ×2 IMPLANT
NDL FILTER BLUNT 18X1 1/2 (NEEDLE) ×1 IMPLANT
NDL RETROBULBAR .5 NSTRL (NEEDLE) IMPLANT
NEEDLE FILTER BLUNT 18X 1/2SAF (NEEDLE) ×1
NEEDLE FILTER BLUNT 18X1 1/2 (NEEDLE) ×1 IMPLANT
PACK CATARACT BRASINGTON (MISCELLANEOUS) ×2 IMPLANT
PACK EYE AFTER SURG (MISCELLANEOUS) ×2 IMPLANT
PACK OPTHALMIC (MISCELLANEOUS) ×2 IMPLANT
RING MALYGIN 7.0 (MISCELLANEOUS) IMPLANT
SPONGE SURG I SPEAR (MISCELLANEOUS) ×2 IMPLANT
SUT ETHILON 10-0 CS-B-6CS-B-6 (SUTURE)
SUT VICRYL  9 0 (SUTURE)
SUT VICRYL 9 0 (SUTURE) IMPLANT
SUTURE EHLN 10-0 CS-B-6CS-B-6 (SUTURE) IMPLANT
SYR 3ML LL SCALE MARK (SYRINGE) ×2 IMPLANT
SYR 5ML LL (SYRINGE) ×2 IMPLANT
SYR TB 1ML LUER SLIP (SYRINGE) ×2 IMPLANT
WATER STERILE IRR 500ML POUR (IV SOLUTION) ×2 IMPLANT
WIPE NON LINTING 3.25X3.25 (MISCELLANEOUS) ×2 IMPLANT

## 2017-06-28 NOTE — H&P (Signed)
The History and Physical notes are on paper, have been signed, and are to be scanned. The patient remains stable and unchanged from the H&P.   Previous H&P reviewed, patient examined, and there are no changes.  , 06/28/2017 10:59 AM

## 2017-06-28 NOTE — Op Note (Signed)
LOCATION:  [place]   PREOPERATIVE DIAGNOSIS:    Nuclear sclerotic cataract right eye. H25.11   POSTOPERATIVE DIAGNOSIS:  Nuclear sclerotic cataract right eye.     PROCEDURE:  Phacoemusification with posterior chamber intraocular lens placement of the right eye   LENS:   Implant Name Type Inv. Item Serial No. Manufacturer Lot No. LRB No. Used  LENS IOL DIOP 22.5 - [id number] Intraocular Lens LENS IOL DIOP 22.5 [id number]  Right 1        ULTRASOUND TIME: 12 % of 1 minutes, 1 seconds.  CDE 7.6   SURGEON:  Wyonia Hough, MD   ANESTHESIA:  Topical with tetracaine drops and 2% Xylocaine jelly, augmented with 1% preservative-free intracameral lidocaine.    COMPLICATIONS:  None.   DESCRIPTION OF PROCEDURE:  The patient was identified in the holding room and transported to the operating room and placed in the supine position under the operating microscope.  The right eye was identified as the operative eye and it was prepped and draped in the usual sterile ophthalmic fashion.   A 1 millimeter clear-corneal paracentesis was made at the 12:00 position.  0.5 ml of preservative-free 1% lidocaine was injected into the anterior chamber. The anterior chamber was filled with Viscoat viscoelastic.  A 2.4 millimeter keratome was used to make a near-clear corneal incision at the 9:00 position.  A curvilinear capsulorrhexis was made with a cystotome and capsulorrhexis forceps.  Balanced salt solution was used to hydrodissect and hydrodelineate the nucleus.   Phacoemulsification was then used in stop and chop fashion to remove the lens nucleus and epinucleus.  The remaining cortex was then removed using the irrigation and aspiration handpiece. Provisc was then placed into the capsular bag to distend it for lens placement.  A lens was then injected into the capsular bag.  The remaining viscoelastic was aspirated.   Wounds were hydrated with balanced salt solution.  The anterior  chamber was inflated to a physiologic pressure with balanced salt solution.  No wound leaks were noted. Cefuroxime 0.1 ml of a 10mg /ml solution was injected into the anterior chamber for a dose of 1 mg of intracameral antibiotic at the completion of the case.   Timolol and Brimonidine drops were applied to the eye.  The patient was taken to the recovery room in stable condition without complications of anesthesia or surgery.   , 06/28/2017, 11:47 AM

## 2017-06-28 NOTE — Transfer of Care (Signed)
Immediate Anesthesia Transfer of Care Note  Patient: James Camacho  Procedure(s) Performed: CATARACT EXTRACTION PHACO AND INTRAOCULAR LENS PLACEMENT (I[facility]) RIGHT (Right Eye)  Patient Location: PACU  Anesthesia Type: MAC  Level of Consciousness: awake, alert  and patient cooperative  Airway and Oxygen Therapy: Patient Spontanous Breathing and Patient connected to supplemental oxygen  Post-op Assessment: Post-op Vital signs reviewed, Patient's Cardiovascular Status Stable, Respiratory Function Stable, Patent Airway and No signs of Nausea or vomiting  Post-op Vital Signs: Reviewed and stable  Complications: No apparent anesthesia complications

## 2017-06-28 NOTE — Anesthesia Procedure Notes (Signed)
Procedure Name: MAC Date/Time: 06/28/2017 11:29 AM Performed by: Lind Guest, CRNA Pre-anesthesia Checklist: Patient identified, Emergency Drugs available, Suction available, Patient being monitored and Timeout performed Patient Re-evaluated:Patient Re-evaluated prior to induction Oxygen Delivery Method: Nasal cannula

## 2017-06-28 NOTE — Anesthesia Postprocedure Evaluation (Signed)
Anesthesia Post Note  Patient: James Camacho  Procedure(s) Performed: CATARACT EXTRACTION PHACO AND INTRAOCULAR LENS PLACEMENT ([place]) RIGHT (Right Eye)  Patient location during evaluation: PACU Anesthesia Type: MAC Level of consciousness: awake and alert and oriented Pain management: satisfactory to patient Vital Signs Assessment: post-procedure vital signs reviewed and stable Respiratory status: spontaneous breathing, nonlabored ventilation and respiratory function stable Cardiovascular status: blood pressure returned to baseline and stable Postop Assessment: Adequate PO intake and No signs of nausea or vomiting Anesthetic complications: no    Raliegh Ip

## 2017-06-28 NOTE — Anesthesia Preprocedure Evaluation (Signed)
Anesthesia Evaluation  Patient identified by MRN, date of birth, ID band Patient awake    Reviewed: Allergy & Precautions, H&P , NPO status , Patient's Chart, lab work & pertinent test results  Airway Mallampati: III  TM Distance: >3 FB Neck ROM: full    Dental no notable dental hx.    Pulmonary    Pulmonary exam normal breath sounds clear to auscultation       Cardiovascular hypertension, Normal cardiovascular exam Rhythm:regular Rate:Normal     Neuro/Psych PSYCHIATRIC DISORDERS    GI/Hepatic   Endo/Other  Morbid obesity  Renal/GU      Musculoskeletal   Abdominal   Peds  Hematology   Anesthesia Other Findings   Reproductive/Obstetrics                             Anesthesia Physical Anesthesia Plan  ASA: II  Anesthesia Plan: MAC   Post-op Pain Management:    Induction:   PONV Risk Score and Plan: 1 and Treatment may vary due to age or medical condition and Midazolam  Airway Management Planned:   Additional Equipment:   Intra-op Plan:   Post-operative Plan:   Informed Consent: I have reviewed the patients History and Physical, chart, labs and discussed the procedure including the risks, benefits and alternatives for the proposed anesthesia with the patient or authorized representative who has indicated his/her understanding and acceptance.     Plan Discussed with: CRNA  Anesthesia Plan Comments:         Anesthesia Quick Evaluation

## 2017-06-29 ENCOUNTER — Encounter: Payer: Self-pay | Admitting: Ophthalmology

## 2017-06-30 ENCOUNTER — Encounter: Payer: Self-pay | Admitting: Internal Medicine

## 2017-06-30 ENCOUNTER — Ambulatory Visit (INDEPENDENT_AMBULATORY_CARE_PROVIDER_SITE_OTHER): Payer: Medicare HMO | Admitting: Internal Medicine

## 2017-06-30 VITALS — BP 138/88 | HR 74 | Ht 68.0 in | Wt 247.0 lb

## 2017-06-30 DIAGNOSIS — G4719 Other hypersomnia: Secondary | ICD-10-CM

## 2017-06-30 NOTE — Progress Notes (Signed)
[facility] Consultation      Assessment and Plan:  Excessive daytime sleepiness.  -Symptoms and signs of obstructive sleep apnea. -We will send for sleep study.  Essential hypertension, borderline diabetes.  - Obstructive sleep apnea can contribute to above conditions, therefore treatment of the sleep apnea is important part of her management.  Date: 06/30/2017  MRN# [id number] James Camacho [birth year]  Referring Physician:   Shimon Trowbridge is a 67 y.o. old male seen in consultation for chief complaint of:    Chief Complaint  Patient presents with  . Consult    ref by Silvio Pate for sleep issues: daytime sleepiness: restless sleep:     HPI:   He notes that he has been a loud snorer for several years, he no longer sleeps in his wifes bedroom. He also catches himself snoring and waking himself up.  He complains of daytime sleepiness, Epworth is 14 today. He wakes up feels tired in the am when he wakes up. He is retired, but still does some work that requires a lot of driving.  Occasional am headache. No sleepwalking, no sleep attacks. He falls asleep during the day.   Denies jaw pain or TMJ.   PMHX:   Past Medical History:  Diagnosis Date  . Anxiety   . Depression   . ED (erectile dysfunction)   . H/O seasonal allergies   . Hyperlipidemia    LDL in 120's  . Hypertension   . Wears hearing aid in both ears    Surgical Hx:  Past Surgical History:  Procedure Laterality Date  . APPENDECTOMY     with chole  . CARDIOVASCULAR STRESS TEST  02/09   negative  . CATARACT EXTRACTION W/PHACO Right 06/28/2017   Procedure: CATARACT EXTRACTION PHACO AND INTRAOCULAR LENS PLACEMENT ([place]) RIGHT;  Surgeon: Leandrew Koyanagi, MD;  Location: [facility];  Service: Ophthalmology;  Laterality: Right;  . CHOLECYSTECTOMY     done by Dr Sharlet Salina in 1980's   Family Hx:  Family History  Problem Relation Age of Onset  . Diabetes Mother   . Arthritis Mother    . Hypertension Mother   . Chronic Renal Failure Mother   . Parkinsonism Father   . Heart disease Father   . Cancer Neg Hx        no colon or prostate cancer  . Colon cancer Neg Hx   . Esophageal cancer Neg Hx   . Stomach cancer Neg Hx   . Rectal cancer Neg Hx    Social Hx:   Social History   Tobacco Use  . Smoking status: Never Smoker  . Smokeless tobacco: Never Used  Substance Use Topics  . Alcohol use: Yes    Comment: rare  . Drug use: No   Medication:    Current Outpatient Medications:  .  amLODipine (NORVASC) 10 MG tablet, Take 1 tablet (10 mg total) by mouth daily. NEEDS OFFICE VISIT, Disp: 90 tablet, Rfl: 0 .  atorvastatin (LIPITOR) 10 MG tablet, Take 1 tablet (10 mg total) by mouth daily., Disp: 90 tablet, Rfl: 3 .  carvedilol (COREG) 12.5 MG tablet, TAKE ONE TABLET BY MOUTH TWICE DAILY WITH MEALS, Disp: 180 tablet, Rfl: 2 .  clobetasol ointment (TEMOVATE) 0.05 %, Apply topically 2 (two) times daily as needed., Disp: 30 g, Rfl: 1 .  furosemide (LASIX) 40 MG tablet, Take 1 tablet (40 mg total) by mouth daily., Disp: 90 tablet, Rfl: 0 .  lisinopril (PRINIVIL,ZESTRIL) 40 MG tablet, TAKE ONE TABLET  BY MOUTH EVERY DAY, Disp: 90 tablet, Rfl: 0 .  metFORMIN (GLUCOPHAGE-XR) 500 MG 24 hr tablet, Take 1 tablet (500 mg total) by mouth daily with breakfast., Disp: 90 tablet, Rfl: 3 .  sildenafil (REVATIO) 20 MG tablet, TAKE 3 TO 5 TABLETS DAILY AS DIRECTED, Disp: 50 tablet, Rfl: 11   Allergies:  Aleve [naproxen sodium]; Other; Penicillins; and Shellfish allergy  Review of Systems: Gen:  Denies  fever, sweats, chills HEENT: Denies blurred vision, double vision. bleeds, sore throat Cvc:  No dizziness, chest pain. Resp:   Denies cough or sputum production, shortness of breath Gi: Denies swallowing difficulty, stomach pain. Gu:  Denies bladder incontinence, burning urine Ext:   No Joint pain, stiffness. Skin: No skin rash,  hives  Endoc:  No polyuria, polydipsia. Psych: No  depression, insomnia. Other:  All other systems were reviewed with the patient and were negative other that what is mentioned in the HPI.   Physical Examination:   VS: BP 138/88 (BP Location: Left Arm, Cuff Size: Normal)   Pulse 74   Ht 5\' 8"  (1.727 m)   Wt 247 lb (112 kg)   SpO2 91%   BMI 37.56 kg/m   General Appearance: No distress  Neuro:without focal findings,  speech normal,  HEENT: PERRLA, EOM intact.  Mallampati 4 Pulmonary: normal breath sounds, No wheezing.  CardiovascularNormal S1,S2.  No m/r/g.   Abdomen: Benign, Soft, non-tender. Renal:  No costovertebral tenderness  GU:  No performed at this time. Endoc: No evident thyromegaly, no signs of acromegaly. Skin:   warm, no rashes, no ecchymosis  Extremities: normal, no cyanosis, clubbing.  Other findings:    LABORATORY PANEL:   CBC No results for input(s): WBC, HGB, HCT, PLT in the last 168 hours. ------------------------------------------------------------------------------------------------------------------  Chemistries  No results for input(s): NA, K, CL, CO2, GLUCOSE, BUN, CREATININE, CALCIUM, MG, AST, ALT, ALKPHOS, BILITOT in the last 168 hours.  Invalid input(s): GFRCGP ------------------------------------------------------------------------------------------------------------------  Cardiac Enzymes No results for input(s): TROPONINI in the last 168 hours. ------------------------------------------------------------  RADIOLOGY:  No results found.     Thank  you for the consultation and for allowing [facility] to assist in the care of your patient. Our recommendations are noted above.  Please contact us if we can be of further service.   Marda Stalker, MD.  Board Certified in Internal Medicine, Pulmonary Medicine, [place], and Sleep Medicine.  [facility] Office Number: [phone]  Patricia Pesa, M.D.  Merton Border,  M.D  06/30/2017

## 2017-06-30 NOTE — Patient Instructions (Signed)
--

## 2017-07-04 DIAGNOSIS — H2512 Age-related nuclear cataract, left eye: Secondary | ICD-10-CM | POA: Diagnosis not present

## 2017-07-13 ENCOUNTER — Telehealth: Payer: Self-pay | Admitting: Internal Medicine

## 2017-07-13 NOTE — Telephone Encounter (Addendum)
Copied from [place] [id number]. Topic: Quick Communication - Rx Refill/Question >> Jul 13, 2017  1:29 PM Lennox Solders wrote: Medication: carvedilol to [address] [place]. Has the patient contacted their pharmacy? no  Preferred Pharmacy (with phone number or street name): [address] [place] Agent: Please be advised that RX refills may take up to 3 business days. We ask that you follow-up with your pharmacy.

## 2017-07-14 ENCOUNTER — Other Ambulatory Visit: Payer: Self-pay | Admitting: *Deleted

## 2017-07-14 MED ORDER — CARVEDILOL 12.5 MG PO TABS: 12.5000 mg | ORAL_TABLET | Freq: Two times a day (BID) | ORAL | 2 refills | Status: DC

## 2017-07-14 NOTE — Telephone Encounter (Signed)
Rx refilled per protocol. LOV: 06/20/17

## 2017-07-14 NOTE — Discharge Instructions (Signed)

## 2017-07-19 ENCOUNTER — Ambulatory Visit: Payer: Medicare HMO | Admitting: Anesthesiology

## 2017-07-19 ENCOUNTER — Ambulatory Visit
Admission: RE | Admit: 2017-07-19 | Discharge: 2017-07-19 | Disposition: A | Discharge status: Home / Self Care | Payer: Medicare HMO | Source: Ambulatory Visit | Attending: Ophthalmology | Admitting: Ophthalmology

## 2017-07-19 ENCOUNTER — Encounter
Admission: RE | Disposition: A | Discharge status: Home / Self Care | Payer: Self-pay | Source: Ambulatory Visit | Attending: Ophthalmology

## 2017-07-19 DIAGNOSIS — H25812 Combined forms of age-related cataract, left eye: Secondary | ICD-10-CM | POA: Diagnosis not present

## 2017-07-19 DIAGNOSIS — H2512 Age-related nuclear cataract, left eye: Secondary | ICD-10-CM | POA: Diagnosis not present

## 2017-07-19 DIAGNOSIS — I10 Essential (primary) hypertension: Secondary | ICD-10-CM | POA: Diagnosis not present

## 2017-07-19 DIAGNOSIS — Z6837 Body mass index (BMI) 37.0-37.9, adult: Secondary | ICD-10-CM | POA: Diagnosis not present

## 2017-07-19 HISTORY — PX: CATARACT EXTRACTION W/PHACO: SHX586

## 2017-07-19 LAB — GLUCOSE, CAPILLARY: GLUCOSE-CAPILLARY: 152 mg/dL — AB (ref 65–99)

## 2017-07-19 SURGERY — PHACOEMULSIFICATION, CATARACT, WITH IOL INSERTION
Anesthesia: Monitor Anesthesia Care | Site: Eye | Laterality: Left | Wound class: "Clean "

## 2017-07-19 MED ORDER — LACTATED RINGERS IV SOLN: INTRAVENOUS | Status: DC

## 2017-07-19 MED ORDER — FENTANYL CITRATE (PF) 100 MCG/2ML IJ SOLN
INTRAMUSCULAR | Status: DC | PRN
  Administered 2017-07-19 (×2): 50 ug via INTRAVENOUS

## 2017-07-19 MED ORDER — MOXIFLOXACIN HCL 0.5 % OP SOLN
1.0000 [drp] | OPHTHALMIC | Status: DC | PRN
  Administered 2017-07-19 (×3): 1 [drp] via OPHTHALMIC

## 2017-07-19 MED ORDER — ARMC OPHTHALMIC DILATING DROPS
1.0000 "application " | OPHTHALMIC | Status: DC | PRN
  Administered 2017-07-19 (×3): 1 via OPHTHALMIC

## 2017-07-19 MED ORDER — NA HYALUR & NA CHOND-NA HYALUR 0.4-0.35 ML IO KIT
PACK | INTRAOCULAR | Status: DC | PRN
  Administered 2017-07-19: 1 mL via INTRAOCULAR

## 2017-07-19 MED ORDER — BRIMONIDINE TARTRATE-TIMOLOL 0.2-0.5 % OP SOLN
OPHTHALMIC | Status: DC | PRN
  Administered 2017-07-19: 1 [drp] via OPHTHALMIC

## 2017-07-19 MED ORDER — ACETAMINOPHEN 325 MG PO TABS: 650.0000 mg | ORAL_TABLET | Freq: Once | ORAL | Status: DC | PRN

## 2017-07-19 MED ORDER — ACETAMINOPHEN 160 MG/5ML PO SOLN: 325.0000 mg | ORAL | Status: DC | PRN

## 2017-07-19 MED ORDER — MIDAZOLAM HCL 2 MG/2ML IJ SOLN
INTRAMUSCULAR | Status: DC | PRN
  Administered 2017-07-19 (×2): 1 mg via INTRAVENOUS

## 2017-07-19 MED ORDER — EPINEPHRINE PF 1 MG/ML IJ SOLN
INTRAOCULAR | Status: DC | PRN
  Administered 2017-07-19: 58 mL via OPHTHALMIC

## 2017-07-19 MED ORDER — ONDANSETRON HCL 4 MG/2ML IJ SOLN: 4.0000 mg | Freq: Once | INTRAMUSCULAR | Status: DC | PRN

## 2017-07-19 MED ORDER — MOXIFLOXACIN HCL 0.5 % OP SOLN
OPHTHALMIC | Status: DC | PRN
  Administered 2017-07-19: 0.2 mL via OPHTHALMIC

## 2017-07-19 MED ORDER — BALANCED SALT IO SOLN
INTRAOCULAR | Status: DC | PRN
  Administered 2017-07-19: 1 mL

## 2017-07-19 SURGICAL SUPPLY — 20 items
CANNULA ANT/CHMB 27G (MISCELLANEOUS) ×1 IMPLANT
CANNULA ANT/CHMB 27GA (MISCELLANEOUS) ×2 IMPLANT
GLOVE SURG LX 7.5 STRW (GLOVE) ×1
GLOVE SURG LX STRL 7.5 STRW (GLOVE) ×1 IMPLANT
GLOVE SURG TRIUMPH 8.0 PF LTX (GLOVE) ×2 IMPLANT
GOWN STRL REUS W/ TWL LRG LVL3 (GOWN DISPOSABLE) ×2 IMPLANT
GOWN STRL REUS W/TWL LRG LVL3 (GOWN DISPOSABLE) ×2
LENS IOL TECNIS ITEC 22.5 (Intraocular Lens) ×1 IMPLANT
MARKER SKIN DUAL TIP RULER LAB (MISCELLANEOUS) ×2 IMPLANT
NDL FILTER BLUNT 18X1 1/2 (NEEDLE) ×1 IMPLANT
NEEDLE FILTER BLUNT 18X 1/2SAF (NEEDLE) ×1
NEEDLE FILTER BLUNT 18X1 1/2 (NEEDLE) ×1 IMPLANT
PACK CATARACT BRASINGTON (MISCELLANEOUS) ×2 IMPLANT
PACK EYE AFTER SURG (MISCELLANEOUS) ×2 IMPLANT
PACK OPTHALMIC (MISCELLANEOUS) ×2 IMPLANT
SYR 3ML LL SCALE MARK (SYRINGE) ×2 IMPLANT
SYR 5ML LL (SYRINGE) ×2 IMPLANT
SYR TB 1ML LUER SLIP (SYRINGE) ×2 IMPLANT
WATER STERILE IRR 500ML POUR (IV SOLUTION) ×2 IMPLANT
WIPE NON LINTING 3.25X3.25 (MISCELLANEOUS) ×2 IMPLANT

## 2017-07-19 NOTE — H&P (Signed)
The History and Physical notes are on paper, have been signed, and are to be scanned. The patient remains stable and unchanged from the H&P.   Previous H&P reviewed, patient examined, and there are no changes.  , 07/19/2017 7:42 AM

## 2017-07-19 NOTE — Anesthesia Postprocedure Evaluation (Signed)
Anesthesia Post Note  Patient: James Camacho  Procedure(s) Performed: CATARACT EXTRACTION PHACO AND INTRAOCULAR LENS PLACEMENT (I[facility]) LEFT TOPICAL (Left Eye)  Patient location during evaluation: PACU Anesthesia Type: MAC Level of consciousness: awake and alert, oriented and patient cooperative Pain management: pain level controlled Vital Signs Assessment: post-procedure vital signs reviewed and stable Respiratory status: spontaneous breathing, nonlabored ventilation and respiratory function stable Cardiovascular status: blood pressure returned to baseline and stable Postop Assessment: adequate PO intake Anesthetic complications: no    Darrin Nipper

## 2017-07-19 NOTE — Anesthesia Procedure Notes (Signed)
Procedure Name: MAC Performed by: , , CRNA Pre-anesthesia Checklist: Patient identified, Emergency Drugs available, Suction available, Timeout performed and Patient being monitored Patient Re-evaluated:Patient Re-evaluated prior to induction Oxygen Delivery Method: Nasal cannula Placement Confirmation: positive ETCO2       

## 2017-07-19 NOTE — Op Note (Signed)
OPERATIVE NOTE  James Camacho [id number] 07/19/2017   PREOPERATIVE DIAGNOSIS:  Nuclear sclerotic cataract left eye. H25.12   POSTOPERATIVE DIAGNOSIS:    Nuclear sclerotic cataract left eye.     PROCEDURE:  Phacoemusification with posterior chamber intraocular lens placement of the left eye   LENS:   Implant Name Type Inv. Item Serial No. Manufacturer Lot No. LRB No. Used  LENS IOL DIOP 22.5 - [id number] Intraocular Lens LENS IOL DIOP 22.5 [id number] AMO  Left 1        ULTRASOUND TIME: 13  % of 1 minutes 1 seconds, CDE 7.8  SURGEON:  Wyonia Hough, MD   ANESTHESIA:  Topical with tetracaine drops and 2% Xylocaine jelly, augmented with 1% preservative-free intracameral lidocaine.    COMPLICATIONS:  None.   DESCRIPTION OF PROCEDURE:  The patient was identified in the holding room and transported to the operating room and placed in the supine position under the operating microscope.  The left eye was identified as the operative eye and it was prepped and draped in the usual sterile ophthalmic fashion.   A 1 millimeter clear-corneal paracentesis was made at the 1:30 position.  0.5 ml of preservative-free 1% lidocaine was injected into the anterior chamber.  The anterior chamber was filled with Viscoat viscoelastic.  A 2.4 millimeter keratome was used to make a near-clear corneal incision at the 10:30 position.  .  A curvilinear capsulorrhexis was made with a cystotome and capsulorrhexis forceps.  Balanced salt solution was used to hydrodissect and hydrodelineate the nucleus.   Phacoemulsification was then used in stop and chop fashion to remove the lens nucleus and epinucleus.  The remaining cortex was then removed using the irrigation and aspiration handpiece. Provisc was then placed into the capsular bag to distend it for lens placement.  A lens was then injected into the capsular bag.  The remaining viscoelastic was aspirated.   Wounds were hydrated with balanced salt  solution.  The anterior chamber was inflated to a physiologic pressure with balanced salt solution.  No wound leaks were noted. Vigamox 0.2 ml of a 1mg  per ml solution was injected into the anterior chamber for a dose of 0.2 mg of intracameral antibiotic at the completion of the case.   Timolol and Brimonidine drops were applied to the eye.  The patient was taken to the recovery room in stable condition without complications of anesthesia or surgery.  , 07/19/2017, 8:30 AM

## 2017-07-19 NOTE — Transfer of Care (Signed)
Immediate Anesthesia Transfer of Care Note  Patient: James Camacho  Procedure(s) Performed: CATARACT EXTRACTION PHACO AND INTRAOCULAR LENS PLACEMENT ([facility]) LEFT TOPICAL (Left Eye)  Patient Location: PACU  Anesthesia Type: MAC  Level of Consciousness: awake, alert  and patient cooperative  Airway and Oxygen Therapy: Patient Spontanous Breathing and Patient connected to supplemental oxygen  Post-op Assessment: Post-op Vital signs reviewed, Patient's Cardiovascular Status Stable, Respiratory Function Stable, Patent Airway and No signs of Nausea or vomiting  Post-op Vital Signs: Reviewed and stable  Complications: No apparent anesthesia complications

## 2017-07-19 NOTE — Anesthesia Preprocedure Evaluation (Signed)
Anesthesia Evaluation  Patient identified by MRN, date of birth, ID band Patient awake    Reviewed: Allergy & Precautions, H&P , NPO status , Patient's Chart, lab work & pertinent test results  History of Anesthesia Complications Negative for: history of anesthetic complications  Airway Mallampati: III  TM Distance: >3 FB Neck ROM: full    Dental no notable dental hx.    Pulmonary neg pulmonary ROS,    Pulmonary exam normal breath sounds clear to auscultation       Cardiovascular Exercise Tolerance: Good hypertension, Normal cardiovascular exam Rhythm:regular Rate:Normal     Neuro/Psych PSYCHIATRIC DISORDERS Anxiety Depression HOH    GI/Hepatic negative GI ROS,   Endo/Other  Morbid obesity  Renal/GU negative Renal ROS     Musculoskeletal   Abdominal   Peds  Hematology negative hematology ROS (+)   Anesthesia Other Findings   Reproductive/Obstetrics                             Anesthesia Physical  Anesthesia Plan  ASA: II  Anesthesia Plan: MAC   Post-op Pain Management:    Induction:   PONV Risk Score and Plan: 1 and Midazolam and TIVA  Airway Management Planned: Natural Airway  Additional Equipment:   Intra-op Plan:   Post-operative Plan:   Informed Consent: I have reviewed the patients History and Physical, chart, labs and discussed the procedure including the risks, benefits and alternatives for the proposed anesthesia with the patient or authorized representative who has indicated his/her understanding and acceptance.     Plan Discussed with: CRNA  Anesthesia Plan Comments:         Anesthesia Quick Evaluation

## 2017-07-20 ENCOUNTER — Encounter: Payer: Self-pay | Admitting: Ophthalmology

## 2017-07-26 ENCOUNTER — Telehealth: Payer: Self-pay | Admitting: Internal Medicine

## 2017-07-26 MED ORDER — AMLODIPINE BESYLATE 10 MG PO TABS: 10.0000 mg | ORAL_TABLET | Freq: Every day | ORAL | 1 refills | Status: DC

## 2017-07-26 MED ORDER — LISINOPRIL 40 MG PO TABS: 40.0000 mg | ORAL_TABLET | Freq: Every day | ORAL | 1 refills | Status: DC

## 2017-07-26 NOTE — Telephone Encounter (Signed)
Copied from [place] [id number]. Topic: Quick Communication - Rx Refill/Question >> Jul 26, 2017 12:45 PM Synthia Innocent wrote: Medication: lisinopril (PRINIVIL,ZESTRIL) 40 MG tablet, amLODipine (NORVASC) 10 MG tablet   Has the patient contacted their pharmacy? Yes.  New pharmacy need script (Agent: If no, request that the patient contact the pharmacy for the refill.) (Agent: If yes, when and what did the pharmacy advise?)  Preferred Pharmacy (with phone number or street name): [facility]  Agent: Please be advised that RX refills may take up to 3 business days. We ask that you follow-up with your pharmacy.

## 2017-08-10 ENCOUNTER — Other Ambulatory Visit: Payer: Self-pay | Admitting: Internal Medicine

## 2017-08-11 ENCOUNTER — Encounter: Payer: Self-pay | Admitting: Internal Medicine

## 2017-08-11 DIAGNOSIS — G4719 Other hypersomnia: Secondary | ICD-10-CM

## 2017-08-11 DIAGNOSIS — G4733 Obstructive sleep apnea (adult) (pediatric): Secondary | ICD-10-CM | POA: Diagnosis not present

## 2017-08-18 ENCOUNTER — Telehealth: Payer: Self-pay | Admitting: *Deleted

## 2017-08-18 DIAGNOSIS — G4733 Obstructive sleep apnea (adult) (pediatric): Secondary | ICD-10-CM

## 2017-08-18 NOTE — Telephone Encounter (Signed)
Pt informed he is positive for sleep apnea. Order placed. Nothing further needed.

## 2017-08-24 DIAGNOSIS — G4733 Obstructive sleep apnea (adult) (pediatric): Secondary | ICD-10-CM | POA: Diagnosis not present

## 2017-09-21 DIAGNOSIS — G4733 Obstructive sleep apnea (adult) (pediatric): Secondary | ICD-10-CM | POA: Diagnosis not present

## 2017-09-23 DIAGNOSIS — G4733 Obstructive sleep apnea (adult) (pediatric): Secondary | ICD-10-CM | POA: Diagnosis not present

## 2017-09-28 NOTE — Progress Notes (Deleted)
  [name]     Assessment and Plan:  Excessive daytime sleepiness.  -Symptoms and signs of obstructive sleep apnea. -We will send for sleep study.  Essential hypertension, borderline diabetes.  - Obstructive sleep apnea can contribute to above conditions, therefore treatment of the sleep apnea is important part of her management.  Date: 09/28/2017  MRN# [id number] [name] 29-Jul-1950   James Camacho is a 67 y.o. old male seen in consultation for chief complaint of:    No chief complaint on file.   HPI:   He notes that he has been a loud snorer for several years, he no longer sleeps in his wifes bedroom. He also catches himself snoring and waking himself up.  He complains of daytime sleepiness, Epworth is 14 today. He wakes up feels tired in the am when he wakes up. He is retired, but still does some work that requires a lot of driving.  Occasional am headache. No sleepwalking, no sleep attacks. He falls asleep during the day.   Denies jaw pain or TMJ.   **Home sleep study 08/11/2017>> moderate OSA with AHI of 25.  Recommended starting on auto CPAP pressure range 5-20.  Medication:    Current Outpatient Medications:  .  amLODipine (NORVASC) 10 MG tablet, Take 1 tablet (10 mg total) by mouth daily. NEEDS OFFICE VISIT, Disp: 90 tablet, Rfl: 1 .  atorvastatin (LIPITOR) 10 MG tablet, Take 1 tablet (10 mg total) by mouth daily., Disp: 90 tablet, Rfl: 3 .  carvedilol (COREG) 12.5 MG tablet, Take 1 tablet (12.5 mg total) by mouth 2 (two) times daily with a meal., Disp: 180 tablet, Rfl: 2 .  clobetasol ointment (TEMOVATE) 0.05 %, Apply topically 2 (two) times daily as needed., Disp: 30 g, Rfl: 1 .  furosemide (LASIX) 40 MG tablet, TAKE 1 TABLET BY MOUTH EVERY DAY, Disp: 90 tablet, Rfl: 3 .  lisinopril (PRINIVIL,ZESTRIL) 40 MG tablet, Take 1 tablet (40 mg total) by mouth daily., Disp: 90 tablet, Rfl: 1 .  metFORMIN (GLUCOPHAGE-XR) 500 MG 24 hr tablet, Take  1 tablet (500 mg total) by mouth daily with breakfast., Disp: 90 tablet, Rfl: 3 .  sildenafil (REVATIO) 20 MG tablet, TAKE 3 TO 5 TABLETS DAILY AS DIRECTED, Disp: 50 tablet, Rfl: 11   Allergies:  Aleve [naproxen sodium]; Other; Penicillins; and Shellfish allergy     LABORATORY PANEL:   CBC No results for input(s): WBC, HGB, HCT, PLT in the last 168 hours. ------------------------------------------------------------------------------------------------------------------  Chemistries  No results for input(s): NA, K, CL, CO2, GLUCOSE, BUN, CREATININE, CALCIUM, MG, AST, ALT, ALKPHOS, BILITOT in the last 168 hours.  Invalid input(s): GFRCGP ------------------------------------------------------------------------------------------------------------------  Cardiac Enzymes No results for input(s): TROPONINI in the last 168 hours. ------------------------------------------------------------  RADIOLOGY:  No results found.     Thank  you for the consultation and for allowing [facility] to assist in the care of your patient. Our recommendations are noted above.  Please contact us if we can be of further service.   Marda Stalker, M.D., F.C.C.P.  Board Certified in Internal Medicine, Pulmonary Medicine, [place], and Sleep Medicine.  [facility] Office Number: [phone]   09/28/2017

## 2017-09-29 ENCOUNTER — Ambulatory Visit: Payer: Medicare HMO | Admitting: Internal Medicine

## 2017-10-09 DIAGNOSIS — G4733 Obstructive sleep apnea (adult) (pediatric): Secondary | ICD-10-CM | POA: Diagnosis not present

## 2017-10-12 DIAGNOSIS — Z0100 Encounter for examination of eyes and vision without abnormal findings: Secondary | ICD-10-CM | POA: Diagnosis not present

## 2017-10-19 ENCOUNTER — Ambulatory Visit (INDEPENDENT_AMBULATORY_CARE_PROVIDER_SITE_OTHER): Payer: Medicare HMO | Admitting: Family Medicine

## 2017-10-19 ENCOUNTER — Encounter: Payer: Self-pay | Admitting: Family Medicine

## 2017-10-19 VITALS — BP 140/78 | HR 77 | Temp 98.5°F | Resp 16 | Wt 250.1 lb

## 2017-10-19 DIAGNOSIS — R197 Diarrhea, unspecified: Secondary | ICD-10-CM

## 2017-10-19 DIAGNOSIS — R0981 Nasal congestion: Secondary | ICD-10-CM

## 2017-10-19 DIAGNOSIS — J0110 Acute frontal sinusitis, unspecified: Secondary | ICD-10-CM

## 2017-10-19 MED ORDER — LOPERAMIDE HCL 2 MG PO TABS: 2.0000 mg | ORAL_TABLET | Freq: Four times a day (QID) | ORAL | 0 refills | Status: DC | PRN

## 2017-10-19 MED ORDER — FLUTICASONE PROPIONATE 50 MCG/ACT NA SUSP: 2.0000 | Freq: Every day | NASAL | 1 refills | Status: DC

## 2017-10-19 MED ORDER — SULFAMETHOXAZOLE-TRIMETHOPRIM 800-160 MG PO TABS: 1.0000 | ORAL_TABLET | Freq: Two times a day (BID) | ORAL | 0 refills | Status: AC

## 2017-10-19 NOTE — Patient Instructions (Signed)
Great to meet you!  Increase fluids, rest, do good handwashing  Eat bland foods and drink clear liquids for the next 2 days and slowly advance diet as tolerated

## 2017-10-19 NOTE — Progress Notes (Signed)
   Subjective:    Patient ID: James Camacho, male    DOB: [birth year], 67 y.o.   MRN: [id number]  HPI   Patient presents to clinic due to sinus pressure, pain, congestion for over a week. The congestion became worse last night and this AM he feels like he has no energy.  Also began to have episodes of diarrhea last night, all throughout the night. He drink some ginger ale and ate a biscuit today, stomach feels somewhat better but still not at 100%.   Denies fever or chills. No nausea or vomiting.    Patient Active Problem List   Diagnosis Date Noted  . Advance directive discussed with patient 06/20/2017  . Diabetes mellitus type 2, uncontrolled ([place]) 01/04/2017  . Allergic reaction to shellfish 07/25/2014  . Severe obesity (BMI 35.0-39.9) with comorbidity ([place]) 07/12/2011  . Routine general medical examination at a health care facility 01/11/2011  . Actinic keratosis 01/11/2011  . Sleep disturbance 07/06/2010  . Essential hypertension, benign 07/06/2007  . 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 Smoking status: Never Smoker  . Smokeless tobacco: Never Used  Substance Use Topics  . Alcohol use: Yes    Comment: rare   Review of Systems  Constitutional: Negative for chills, fatigue and fever.  HENT: Postive for congestion, ear pain, sinus pain and sore throat.   Eyes: Negative.   Respiratory: Negative for cough, shortness of breath and wheezing.   Cardiovascular: Negative for chest pain, palpitations and leg swelling.  Gastrointestinal: Negative for abdominal pain,nausea and vomiting. Positive for diarrhea. Genitourinary: Negative for dysuria, frequency and urgency.  Musculoskeletal: Negative for arthralgias and myalgias.  Skin: Negative for color change, pallor and rash.  Neurological: Negative for syncope, light-headedness and headaches.  Psychiatric/Behavioral: The patient is not nervous/anxious.    Objective:   Physical  Exam  Constitutional: He is oriented to person, place, and time. He appears well-developed and well-nourished. No distress.  Head: Normocephalic and atraumatic.  Eyes: Conjunctivae and EOM are normal. No scleral icterus.  Nose/throat: Nares inflamed.  Thick nasal discharge.  Positive postnasal drip.  Positive frontal sinus tenderness. Neck: Neck supple. No tracheal deviation present.  Cardiovascular: Normal rate, regular rhythm and normal heart sounds.  Pulmonary/Chest: Effort normal and breath sounds normal. No respiratory distress. He has no wheezes. He has no rales.  Abdominal: Soft. Bowel sounds are normal. There is no tenderness.  Neurological: He is alert and oriented to person, place, and time. Gait normal.  Skin: Skin is warm and dry. He is not diaphoretic. No pallor.  Psychiatric: He has a normal mood and affect. His behavior is normal.   Nursing note and vitals reviewed.   Vitals:   10/19/17 1350  BP: 140/78  Pulse: 77  Resp: 16  Temp: 98.5 F (36.9 C)  SpO2: 97%      Assessment & Plan:    Sinusitis -patient is penicillin allergic.  He will take Bactrim twice daily for 10 days.  Advised to increase fluids.  Nasal congestion -patient will use Flonase nasal spray to help relieve congestion.  Also suggest that he can use saline nasal flushes.  Diarrhea -patient will use Imodium as needed to help calm diarrhea symptoms.  Advised to eat bland foods and drink clear liquids over the next 2 days to allow some gut rest, then may slowly advance diet as tolerated.  Follow up if symptoms persist or worsen.

## 2017-10-24 DIAGNOSIS — G4733 Obstructive sleep apnea (adult) (pediatric): Secondary | ICD-10-CM | POA: Diagnosis not present

## 2017-11-10 NOTE — Progress Notes (Addendum)
[facility]     Assessment and Plan:  Obstructive sleep apnea. -Having trouble tolerating cpap.  --Will send for mask fitting.   Essential hypertension, borderline diabetes.  - Obstructive sleep apnea can contribute to above conditions, therefore treatment of the sleep apnea is important part of her management.  Orders Placed This Encounter  Procedures  . Desensitization mask fit    Addendum: 02/08/2018. Review of download data 11/10/2017-02/07/2018>> raw data personally reviewed, usage greater than 4 hours is 82/90 days entheses 91%).  Average usage on days used is 6 hours 51 minutes.  Pressure ranges 5-20.  Median pressure is 10, 95th percentile pressure is 12, maximum pressure is 13.  Leak is elevated.  Residual AHI is 1.8.   Overall this shows very good compliance with CPAP with excellent control of obstructive sleep apnea.  Date: 11/10/2017  MRN# [id number] James Camacho 11-Sep-1950    James Camacho is a 67 y.o. old male seen in consultation for chief complaint of:    Chief Complaint  Patient presents with  . Sleep Apnea    HPI:   The patient is a 67 year old male, last seen with symptoms and signs of obstructive sleep apnea.  He was sent for home sleep test which showed moderate sleep apnea with an AHI of 25.  He was started on CPAP auto. He has trouble tolerating the mask, he got a chin strap and a full face mask. He often has to wake for his job and leave after only a few hours, he will then sleep during the day on the couch.   Denies jaw pain or TMJ.   **CPAP download 10/13/2017-11/11/2017>> uses greater than 4 hours 8/30 days.  Average usage on days used 6 hours 30 minutes.  Pressure range is 4-20.  Median pressure 5.9, 93 percentile pressure 8.6, maximum pressure 10.  Residual AHI 0.9.  Overall this shows an adequate compliance, with excellent control of obstructive sleep apnea when used. **HST 08/11/2017>> AHI 25, recommended auto CPAP with pressure  range 5-20.  Medication:    Current Outpatient Medications:  .  amLODipine (NORVASC) 10 MG tablet, Take 1 tablet (10 mg total) by mouth daily. NEEDS OFFICE VISIT, Disp: 90 tablet, Rfl: 1 .  atorvastatin (LIPITOR) 10 MG tablet, Take 1 tablet (10 mg total) by mouth daily., Disp: 90 tablet, Rfl: 3 .  carvedilol (COREG) 12.5 MG tablet, Take 1 tablet (12.5 mg total) by mouth 2 (two) times daily with a meal., Disp: 180 tablet, Rfl: 2 .  clobetasol ointment (TEMOVATE) 0.05 %, Apply topically 2 (two) times daily as needed., Disp: 30 g, Rfl: 1 .  fluticasone (FLONASE) 50 MCG/ACT nasal spray, Place 2 sprays into both nostrils daily., Disp: 16 g, Rfl: 1 .  furosemide (LASIX) 40 MG tablet, TAKE 1 TABLET BY MOUTH EVERY DAY, Disp: 90 tablet, Rfl: 3 .  lisinopril (PRINIVIL,ZESTRIL) 40 MG tablet, Take 1 tablet (40 mg total) by mouth daily., Disp: 90 tablet, Rfl: 1 .  loperamide (IMODIUM A-D) 2 MG tablet, Take 1 tablet (2 mg total) by mouth 4 (four) times daily as needed for diarrhea or loose stools., Disp: 30 tablet, Rfl: 0 .  metFORMIN (GLUCOPHAGE-XR) 500 MG 24 hr tablet, Take 1 tablet (500 mg total) by mouth daily with breakfast., Disp: 90 tablet, Rfl: 3 .  sildenafil (REVATIO) 20 MG tablet, TAKE 3 TO 5 TABLETS DAILY AS DIRECTED, Disp: 50 tablet, Rfl: 11   Allergies:  Aleve [naproxen sodium]; Other; Penicillins; and Shellfish allergy  Review of Systems:  Constitutional: Feels well. Cardiovascular: No chest pain.  Pulmonary: Denies dyspnea.   The remainder of systems were reviewed and were found to be negative other than what is documented in the HPI.    Physical Examination:   VS: BP 122/60 (BP Location: Left Arm, Cuff Size: Large)   Pulse 67   Resp 16   Ht 5\' 8"  (1.727 m)   Wt 252 lb (114.3 kg)   SpO2 96%   BMI 38.32 kg/m   General Appearance: No distress  Neuro:without focal findings, mental status, speech normal, alert and oriented HEENT: PERRLA, EOM intact, malimpatti 3.  Pulmonary: No  wheezing, No rales  CardiovascularNormal S1,S2.  No m/r/g.  Abdomen: Benign, Soft, non-tender, No masses Renal:  No costovertebral tenderness  GU:  No performed at this time. Endoc: No evident thyromegaly, no signs of acromegaly or Cushing features Skin:   warm, no rashes, no ecchymosis  Extremities: normal, no cyanosis, clubbing.      LABORATORY PANEL:   CBC No results for input(s): WBC, HGB, HCT, PLT in the last 168 hours. ------------------------------------------------------------------------------------------------------------------  Chemistries  No results for input(s): NA, K, CL, CO2, GLUCOSE, BUN, CREATININE, CALCIUM, MG, AST, ALT, ALKPHOS, BILITOT in the last 168 hours.  Invalid input(s): GFRCGP ------------------------------------------------------------------------------------------------------------------  Cardiac Enzymes No results for input(s): TROPONINI in the last 168 hours. ------------------------------------------------------------  RADIOLOGY:  No results found.     Thank  you for the consultation and for allowing [facility] to assist in the care of your patient. Our recommendations are noted above.  Please contact us if we can be of further service.   James Camacho, M.D., F.C.C.P.  Board Certified in Internal Medicine, Pulmonary Medicine, [place], and Sleep Medicine.  [facility] Office Number: [phone]  11/10/2017

## 2017-11-11 ENCOUNTER — Encounter: Payer: Self-pay | Admitting: Internal Medicine

## 2017-11-13 ENCOUNTER — Telehealth: Payer: Self-pay | Admitting: Internal Medicine

## 2017-11-13 ENCOUNTER — Other Ambulatory Visit: Payer: Self-pay | Admitting: *Deleted

## 2017-11-13 ENCOUNTER — Ambulatory Visit (INDEPENDENT_AMBULATORY_CARE_PROVIDER_SITE_OTHER): Payer: Medicare HMO | Admitting: Internal Medicine

## 2017-11-13 ENCOUNTER — Encounter: Payer: Self-pay | Admitting: Internal Medicine

## 2017-11-13 VITALS — BP 122/60 | HR 67 | Resp 16 | Ht 68.0 in | Wt 252.0 lb

## 2017-11-13 DIAGNOSIS — G4733 Obstructive sleep apnea (adult) (pediatric): Secondary | ICD-10-CM | POA: Diagnosis not present

## 2017-11-13 NOTE — Telephone Encounter (Signed)
Per Dr. Juanell Fairly patient's cell phone shows different from data from what is available in [facility]. Per [facility] patient has worn cpap 9/30 days. Pt advised to find out from [facility] why this is happening and they can fax updated report. Nothing further needed.

## 2017-11-13 NOTE — Patient Instructions (Signed)
Continue using your cpap every night.

## 2017-11-13 NOTE — Telephone Encounter (Signed)
Patient says he missed a call back from the nurse

## 2017-11-13 NOTE — Telephone Encounter (Signed)
Most recent compliance printed for appt this am. Returned call to patient to see what was supposed to be different. Disconnected.

## 2017-11-13 NOTE — Telephone Encounter (Signed)
Patient calling just needing a call back to let him know we received the better numbers off his cpap machine   Please advise

## 2017-11-16 DIAGNOSIS — L812 Freckles: Secondary | ICD-10-CM | POA: Diagnosis not present

## 2017-11-16 DIAGNOSIS — L821 Other seborrheic keratosis: Secondary | ICD-10-CM | POA: Diagnosis not present

## 2017-11-16 DIAGNOSIS — L820 Inflamed seborrheic keratosis: Secondary | ICD-10-CM | POA: Diagnosis not present

## 2017-11-16 DIAGNOSIS — L400 Psoriasis vulgaris: Secondary | ICD-10-CM | POA: Diagnosis not present

## 2017-11-17 ENCOUNTER — Telehealth: Payer: Self-pay | Admitting: Internal Medicine

## 2017-11-17 NOTE — Telephone Encounter (Signed)
Pt has a mask fit appointment on Monday 11/20/17 at 2:30 with Lynnae Sandhoff at the Sleep Lab in [place]. That appointment was canceled in error by the patient. Called the sleep lab and had pt placed back on schedule for 11/20/17 at 2:30 for mask fit.    Advised patient that if he received any additional calls to write the phone number down so that I could trace where the call was coming from.  Pt stated that he would and thank me for my help.  I feel that patient was just getting the reminder call of the mask fit appointment, however, they wouldn't have mentioned a no show fee on that message.  Nothing else needed at this time. Rhonda J Cobb

## 2017-11-17 NOTE — Telephone Encounter (Signed)
Please call pt regarding appt he has been told he has to be at in [place], it is regarding his sleep study. Pt is confused.

## 2017-11-20 ENCOUNTER — Ambulatory Visit (HOSPITAL_BASED_OUTPATIENT_CLINIC_OR_DEPARTMENT_OTHER): Payer: Medicare HMO | Attending: Internal Medicine | Admitting: Radiology

## 2017-11-20 ENCOUNTER — Other Ambulatory Visit (HOSPITAL_BASED_OUTPATIENT_CLINIC_OR_DEPARTMENT_OTHER): Payer: Medicare HMO

## 2017-11-20 DIAGNOSIS — G4733 Obstructive sleep apnea (adult) (pediatric): Secondary | ICD-10-CM

## 2017-11-24 DIAGNOSIS — G4733 Obstructive sleep apnea (adult) (pediatric): Secondary | ICD-10-CM | POA: Diagnosis not present

## 2017-12-04 DIAGNOSIS — M5136 Other intervertebral disc degeneration, lumbar region: Secondary | ICD-10-CM | POA: Diagnosis not present

## 2017-12-04 DIAGNOSIS — M546 Pain in thoracic spine: Secondary | ICD-10-CM | POA: Diagnosis not present

## 2017-12-04 DIAGNOSIS — M9902 Segmental and somatic dysfunction of thoracic region: Secondary | ICD-10-CM | POA: Diagnosis not present

## 2017-12-04 DIAGNOSIS — M9903 Segmental and somatic dysfunction of lumbar region: Secondary | ICD-10-CM | POA: Diagnosis not present

## 2017-12-05 DIAGNOSIS — M5136 Other intervertebral disc degeneration, lumbar region: Secondary | ICD-10-CM | POA: Diagnosis not present

## 2017-12-05 DIAGNOSIS — M9902 Segmental and somatic dysfunction of thoracic region: Secondary | ICD-10-CM | POA: Diagnosis not present

## 2017-12-05 DIAGNOSIS — M9903 Segmental and somatic dysfunction of lumbar region: Secondary | ICD-10-CM | POA: Diagnosis not present

## 2017-12-05 DIAGNOSIS — M546 Pain in thoracic spine: Secondary | ICD-10-CM | POA: Diagnosis not present

## 2017-12-11 DIAGNOSIS — Z23 Encounter for immunization: Secondary | ICD-10-CM | POA: Diagnosis not present

## 2017-12-11 DIAGNOSIS — M9902 Segmental and somatic dysfunction of thoracic region: Secondary | ICD-10-CM | POA: Diagnosis not present

## 2017-12-11 DIAGNOSIS — M5136 Other intervertebral disc degeneration, lumbar region: Secondary | ICD-10-CM | POA: Diagnosis not present

## 2017-12-11 DIAGNOSIS — R69 Illness, unspecified: Secondary | ICD-10-CM | POA: Diagnosis not present

## 2017-12-11 DIAGNOSIS — M9903 Segmental and somatic dysfunction of lumbar region: Secondary | ICD-10-CM | POA: Diagnosis not present

## 2017-12-11 DIAGNOSIS — M546 Pain in thoracic spine: Secondary | ICD-10-CM | POA: Diagnosis not present

## 2017-12-13 DIAGNOSIS — M546 Pain in thoracic spine: Secondary | ICD-10-CM | POA: Diagnosis not present

## 2017-12-13 DIAGNOSIS — M5136 Other intervertebral disc degeneration, lumbar region: Secondary | ICD-10-CM | POA: Diagnosis not present

## 2017-12-13 DIAGNOSIS — M9903 Segmental and somatic dysfunction of lumbar region: Secondary | ICD-10-CM | POA: Diagnosis not present

## 2017-12-13 DIAGNOSIS — M9902 Segmental and somatic dysfunction of thoracic region: Secondary | ICD-10-CM | POA: Diagnosis not present

## 2017-12-15 DIAGNOSIS — M5136 Other intervertebral disc degeneration, lumbar region: Secondary | ICD-10-CM | POA: Diagnosis not present

## 2017-12-15 DIAGNOSIS — M9903 Segmental and somatic dysfunction of lumbar region: Secondary | ICD-10-CM | POA: Diagnosis not present

## 2017-12-15 DIAGNOSIS — M9902 Segmental and somatic dysfunction of thoracic region: Secondary | ICD-10-CM | POA: Diagnosis not present

## 2017-12-15 DIAGNOSIS — M546 Pain in thoracic spine: Secondary | ICD-10-CM | POA: Diagnosis not present

## 2017-12-18 DIAGNOSIS — M9903 Segmental and somatic dysfunction of lumbar region: Secondary | ICD-10-CM | POA: Diagnosis not present

## 2017-12-18 DIAGNOSIS — M5136 Other intervertebral disc degeneration, lumbar region: Secondary | ICD-10-CM | POA: Diagnosis not present

## 2017-12-18 DIAGNOSIS — M546 Pain in thoracic spine: Secondary | ICD-10-CM | POA: Diagnosis not present

## 2017-12-18 DIAGNOSIS — M9902 Segmental and somatic dysfunction of thoracic region: Secondary | ICD-10-CM | POA: Diagnosis not present

## 2017-12-20 DIAGNOSIS — M5136 Other intervertebral disc degeneration, lumbar region: Secondary | ICD-10-CM | POA: Diagnosis not present

## 2017-12-20 DIAGNOSIS — M9903 Segmental and somatic dysfunction of lumbar region: Secondary | ICD-10-CM | POA: Diagnosis not present

## 2017-12-20 DIAGNOSIS — M9902 Segmental and somatic dysfunction of thoracic region: Secondary | ICD-10-CM | POA: Diagnosis not present

## 2017-12-20 DIAGNOSIS — M546 Pain in thoracic spine: Secondary | ICD-10-CM | POA: Diagnosis not present

## 2017-12-24 DIAGNOSIS — G4733 Obstructive sleep apnea (adult) (pediatric): Secondary | ICD-10-CM | POA: Diagnosis not present

## 2017-12-25 DIAGNOSIS — M9903 Segmental and somatic dysfunction of lumbar region: Secondary | ICD-10-CM | POA: Diagnosis not present

## 2017-12-25 DIAGNOSIS — M5136 Other intervertebral disc degeneration, lumbar region: Secondary | ICD-10-CM | POA: Diagnosis not present

## 2017-12-25 DIAGNOSIS — M9902 Segmental and somatic dysfunction of thoracic region: Secondary | ICD-10-CM | POA: Diagnosis not present

## 2017-12-25 DIAGNOSIS — M546 Pain in thoracic spine: Secondary | ICD-10-CM | POA: Diagnosis not present

## 2017-12-29 DIAGNOSIS — M9903 Segmental and somatic dysfunction of lumbar region: Secondary | ICD-10-CM | POA: Diagnosis not present

## 2017-12-29 DIAGNOSIS — M5136 Other intervertebral disc degeneration, lumbar region: Secondary | ICD-10-CM | POA: Diagnosis not present

## 2017-12-29 DIAGNOSIS — M546 Pain in thoracic spine: Secondary | ICD-10-CM | POA: Diagnosis not present

## 2017-12-29 DIAGNOSIS — M9902 Segmental and somatic dysfunction of thoracic region: Secondary | ICD-10-CM | POA: Diagnosis not present

## 2018-01-01 DIAGNOSIS — M546 Pain in thoracic spine: Secondary | ICD-10-CM | POA: Diagnosis not present

## 2018-01-01 DIAGNOSIS — M9903 Segmental and somatic dysfunction of lumbar region: Secondary | ICD-10-CM | POA: Diagnosis not present

## 2018-01-01 DIAGNOSIS — M9902 Segmental and somatic dysfunction of thoracic region: Secondary | ICD-10-CM | POA: Diagnosis not present

## 2018-01-01 DIAGNOSIS — M5136 Other intervertebral disc degeneration, lumbar region: Secondary | ICD-10-CM | POA: Diagnosis not present

## 2018-01-05 DIAGNOSIS — M546 Pain in thoracic spine: Secondary | ICD-10-CM | POA: Diagnosis not present

## 2018-01-05 DIAGNOSIS — M5136 Other intervertebral disc degeneration, lumbar region: Secondary | ICD-10-CM | POA: Diagnosis not present

## 2018-01-05 DIAGNOSIS — M9903 Segmental and somatic dysfunction of lumbar region: Secondary | ICD-10-CM | POA: Diagnosis not present

## 2018-01-05 DIAGNOSIS — M9902 Segmental and somatic dysfunction of thoracic region: Secondary | ICD-10-CM | POA: Diagnosis not present

## 2018-01-11 ENCOUNTER — Other Ambulatory Visit: Payer: Self-pay | Admitting: Lab

## 2018-01-11 DIAGNOSIS — J0110 Acute frontal sinusitis, unspecified: Secondary | ICD-10-CM

## 2018-01-11 MED ORDER — FLUTICASONE PROPIONATE 50 MCG/ACT NA SUSP: 2.0000 | Freq: Every day | NASAL | 1 refills | Status: DC

## 2018-01-17 ENCOUNTER — Encounter: Payer: Self-pay | Admitting: Internal Medicine

## 2018-01-21 ENCOUNTER — Other Ambulatory Visit: Payer: Self-pay | Admitting: Internal Medicine

## 2018-01-24 DIAGNOSIS — G4733 Obstructive sleep apnea (adult) (pediatric): Secondary | ICD-10-CM | POA: Diagnosis not present

## 2018-01-31 ENCOUNTER — Encounter: Payer: Self-pay | Admitting: Internal Medicine

## 2018-01-31 ENCOUNTER — Ambulatory Visit (AMBULATORY_SURGERY_CENTER): Payer: Self-pay

## 2018-01-31 VITALS — Ht 69.0 in | Wt 252.0 lb

## 2018-01-31 DIAGNOSIS — Z86010 Personal history of colonic polyps: Secondary | ICD-10-CM

## 2018-01-31 MED ORDER — NA SULFATE-K SULFATE-MG SULF 17.5-3.13-1.6 GM/177ML PO SOLN: 1.0000 | Freq: Once | ORAL | 0 refills | Status: AC

## 2018-01-31 NOTE — Progress Notes (Signed)
Per pt, no allergies to soy or egg products.Pt not taking any weight loss meds or using  O2 at home.  Pt refused emmi video. 

## 2018-02-06 ENCOUNTER — Other Ambulatory Visit: Payer: Self-pay | Admitting: *Deleted

## 2018-02-06 ENCOUNTER — Telehealth: Payer: Self-pay | Admitting: Internal Medicine

## 2018-02-06 DIAGNOSIS — G4733 Obstructive sleep apnea (adult) (pediatric): Secondary | ICD-10-CM

## 2018-02-06 NOTE — Telephone Encounter (Signed)
Pt currently has his CPAP with [facility]. Pt has been trying to get medial supplies, and has been told they are awaiting for out office notes. Please call [facility]. Pt states he has tried to call and they will not discuss with him.

## 2018-02-06 NOTE — Telephone Encounter (Signed)
DME order placed for cpap supplies.

## 2018-02-07 ENCOUNTER — Telehealth: Payer: Self-pay | Admitting: Internal Medicine

## 2018-02-07 NOTE — Telephone Encounter (Signed)
Contacted patient and made aware [facility] faxed over new download and request ov note addendum. Once completed we will fax back.  When patient came in for office visit [facility] report usage did not match what phone app use stated. [facility] faxed over new report.

## 2018-02-07 NOTE — Telephone Encounter (Signed)
Please call patient regarding CPAP documentation stats.

## 2018-02-08 NOTE — Telephone Encounter (Signed)
Pending chart addendum from Dr. Ashby Dawes.

## 2018-02-08 NOTE — Telephone Encounter (Signed)
Spoke with patient Told patient what previous note said Patient will be contacting [facility]

## 2018-02-08 NOTE — Telephone Encounter (Signed)
Patient calling in regards to [facility] paperwork  Would like to speak with Suanne Marker Please call to discuss

## 2018-02-08 NOTE — Telephone Encounter (Signed)
Returned call to patient to notifiy chart note has been addended and faxed to [employer]. Nothing further needed at this time.

## 2018-02-09 NOTE — Telephone Encounter (Signed)
Noted  

## 2018-02-12 ENCOUNTER — Telehealth: Payer: Self-pay | Admitting: Internal Medicine

## 2018-02-12 NOTE — Telephone Encounter (Signed)
Patient cancelled procedure for this Wednesday and rescheduled for 01/22 states that he did not have a driver.

## 2018-02-14 ENCOUNTER — Encounter: Payer: Medicare HMO | Admitting: Internal Medicine

## 2018-02-23 DIAGNOSIS — G4733 Obstructive sleep apnea (adult) (pediatric): Secondary | ICD-10-CM | POA: Diagnosis not present

## 2018-02-26 DIAGNOSIS — G4733 Obstructive sleep apnea (adult) (pediatric): Secondary | ICD-10-CM | POA: Diagnosis not present

## 2018-03-02 ENCOUNTER — Encounter: Payer: Self-pay | Admitting: Internal Medicine

## 2018-03-02 ENCOUNTER — Ambulatory Visit (INDEPENDENT_AMBULATORY_CARE_PROVIDER_SITE_OTHER): Payer: Medicare HMO | Admitting: Internal Medicine

## 2018-03-02 VITALS — BP 132/84 | HR 81 | Temp 98.3°F | Ht 68.0 in | Wt 248.0 lb

## 2018-03-02 DIAGNOSIS — R69 Illness, unspecified: Secondary | ICD-10-CM | POA: Diagnosis not present

## 2018-03-02 DIAGNOSIS — Z202 Contact with and (suspected) exposure to infections with a predominantly sexual mode of transmission: Secondary | ICD-10-CM | POA: Diagnosis not present

## 2018-03-02 NOTE — Assessment & Plan Note (Signed)
He doesn't remember all that happened but probably low risk Will check tests No empiric Rx for now

## 2018-03-02 NOTE — Progress Notes (Signed)
Subjective:    Patient ID: James Camacho, male    DOB: [birth year], 67 y.o.   MRN: [id number]  HPI Here due to concern about STD exposure  Was at a party about 3 weeks ago Had too much to drink--and wonders if he got slipped something Had sex with someone --doesn't remember too much Oral sex---she did this for him. Doesn't think he had vaginal intercourse--but not sure  No urethral pain or discharge  Current Outpatient Medications on File Prior to Visit  Medication Sig Dispense Refill  . amLODipine (NORVASC) 10 MG tablet TAKE 1 TABLET (10 MG TOTAL) BY MOUTH DAILY. NEEDS OFFICE VISIT 90 tablet 1  . atorvastatin (LIPITOR) 10 MG tablet Take 1 tablet (10 mg total) by mouth daily. 90 tablet 3  . carvedilol (COREG) 12.5 MG tablet Take 1 tablet (12.5 mg total) by mouth 2 (two) times daily with a meal. 180 tablet 2  . clobetasol ointment (TEMOVATE) 0.05 % Apply topically 2 (two) times daily as needed. 30 g 1  . fluticasone (FLONASE) 50 MCG/ACT nasal spray Place 2 sprays into both nostrils daily. 16 g 1  . furosemide (LASIX) 40 MG tablet TAKE 1 TABLET BY MOUTH EVERY DAY 90 tablet 3  . lisinopril (PRINIVIL,ZESTRIL) 40 MG tablet TAKE 1 TABLET BY MOUTH EVERY DAY 90 tablet 1  . loperamide (IMODIUM A-D) 2 MG tablet Take 1 tablet (2 mg total) by mouth 4 (four) times daily as needed for diarrhea or loose stools. 30 tablet 0  . metFORMIN (GLUCOPHAGE-XR) 500 MG 24 hr tablet Take 1 tablet (500 mg total) by mouth daily with breakfast. 90 tablet 3  . sildenafil (REVATIO) 20 MG tablet TAKE 3 TO 5 TABLETS DAILY AS DIRECTED 50 tablet 11   No current facility-administered medications on file prior to visit.     Allergies  Allergen Reactions  . Aleve [Naproxen Sodium] Hives    Also Advil  . Other     Pt can only take Tylenol for pain- anything prescribed that he has tried breaks him out in a rash.   . Penicillins Rash  . Shellfish Allergy Hives    Past Medical History:  Diagnosis Date  . Allergy     seasonal  . Anxiety   . Cataract 06/2017 and 07/2017   had surgery on both eyes  . Depression   . Diabetes mellitus without complication ([place])   . ED (erectile dysfunction)   . H/O seasonal allergies   . Hyperlipidemia    LDL in 120's  . Hypertension   . Sleep apnea    uses c-pap  . Wears hearing aid in both ears     Past Surgical History:  Procedure Laterality Date  . APPENDECTOMY     with chole  . CARDIOVASCULAR STRESS TEST  02/09   negative  . CATARACT EXTRACTION W/PHACO Right 06/28/2017   Procedure: CATARACT EXTRACTION PHACO AND INTRAOCULAR LENS PLACEMENT ([place]) RIGHT;  Surgeon: Leandrew Koyanagi, MD;  Location: [facility];  Service: Ophthalmology;  Laterality: Right;  . CATARACT EXTRACTION W/PHACO Left 07/19/2017   Procedure: CATARACT EXTRACTION PHACO AND INTRAOCULAR LENS PLACEMENT ([place]) LEFT TOPICAL;  Surgeon: Leandrew Koyanagi, MD;  Location: [facility];  Service: Ophthalmology;  Laterality: Left;  IVA TOPICALLEFTDIABETIC  . CHOLECYSTECTOMY     done by Dr Sharlet Salina in 1980's    Family History  Problem Relation Age of Onset  . Diabetes Mother   . Arthritis Mother   . Hypertension Mother   . Chronic Renal  Failure Mother   . Parkinsonism Father   . Heart disease Father   . Healthy Sister   . Healthy Brother   . Healthy Sister   . Cancer Neg Hx        no colon or prostate cancer  . Colon cancer Neg Hx   . Esophageal cancer Neg Hx   . Stomach cancer Neg Hx   . Rectal cancer Neg Hx     Social History   Socioeconomic History  . Marital status: Married    Spouse name: Not on file  . Number of children: 3  . Years of education: Not on file  . Highest education level: Not on file  Occupational History  . 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 Occupation: Engineer, site business    Comment:    Social Needs  . Financial resource strain: Not on file  . Food insecurity:     Worry: Not on file    Inability: Not on file  . Transportation needs:    Medical: Not on file    Non-medical: Not on file  Tobacco Use  . Smoking status: Never Smoker  . Smokeless tobacco: Never Used  Substance and Sexual Activity  . Alcohol use: Yes    Comment: rare  . Drug use: No  . Sexual activity: Not on file  Lifestyle  . Physical activity:    Days per week: Not on file    Minutes per session: Not on file  . Stress: Not on file  Relationships  . Social connections:    Talks on phone: Not on file    Gets together: Not on file    Attends religious service: Not on file    Active member of club or organization: Not on file    Attends meetings of clubs or organizations: Not on file    Relationship status: Not on file  . Intimate partner violence:    Fear of current or ex partner: Not on file    Emotionally abused: Not on file    Physically abused: Not on file    Forced sexual activity: Not on file  Other Topics Concern  . Not on file  Social History Narrative   Has living will   Wife is health care POA--- children are alternates   Would accept resuscitation   No prolonged tube feeds   Review of Systems  No fever Feels fine     Objective:   Physical Exam  Genitourinary:    Genitourinary Comments: No genital rash Normal urethra No scrotal tenderness   Lymphadenopathy:       Right: No inguinal adenopathy present.       Left: No inguinal adenopathy present.           Assessment & Plan:

## 2018-03-05 LAB — RPR: RPR: NONREACTIVE

## 2018-03-05 LAB — C. TRACHOMATIS/N. GONORRHOEAE RNA
C. trachomatis RNA, TMA: NOT DETECTED
N. gonorrhoeae RNA, TMA: NOT DETECTED

## 2018-03-05 LAB — HIV ANTIBODY (ROUTINE TESTING W REFLEX): HIV 1&2 Ab, 4th Generation: NONREACTIVE

## 2018-03-26 DIAGNOSIS — G4733 Obstructive sleep apnea (adult) (pediatric): Secondary | ICD-10-CM | POA: Diagnosis not present

## 2018-03-28 ENCOUNTER — Encounter: Payer: Self-pay | Admitting: Internal Medicine

## 2018-03-28 ENCOUNTER — Ambulatory Visit (AMBULATORY_SURGERY_CENTER): Payer: Medicare HMO | Admitting: Internal Medicine

## 2018-03-28 VITALS — BP 119/56 | HR 63 | Temp 98.4°F | Resp 23 | Ht 68.0 in | Wt 248.0 lb

## 2018-03-28 DIAGNOSIS — Z86010 Personal history of colonic polyps: Secondary | ICD-10-CM

## 2018-03-28 DIAGNOSIS — D123 Benign neoplasm of transverse colon: Secondary | ICD-10-CM

## 2018-03-28 DIAGNOSIS — D124 Benign neoplasm of descending colon: Secondary | ICD-10-CM

## 2018-03-28 DIAGNOSIS — D122 Benign neoplasm of ascending colon: Secondary | ICD-10-CM

## 2018-03-28 DIAGNOSIS — I10 Essential (primary) hypertension: Secondary | ICD-10-CM | POA: Diagnosis not present

## 2018-03-28 DIAGNOSIS — G4733 Obstructive sleep apnea (adult) (pediatric): Secondary | ICD-10-CM | POA: Diagnosis not present

## 2018-03-28 DIAGNOSIS — A630 Anogenital (venereal) warts: Secondary | ICD-10-CM

## 2018-03-28 DIAGNOSIS — K620 Anal polyp: Secondary | ICD-10-CM

## 2018-03-28 DIAGNOSIS — K635 Polyp of colon: Secondary | ICD-10-CM | POA: Diagnosis not present

## 2018-03-28 DIAGNOSIS — E669 Obesity, unspecified: Secondary | ICD-10-CM | POA: Diagnosis not present

## 2018-03-28 DIAGNOSIS — D125 Benign neoplasm of sigmoid colon: Secondary | ICD-10-CM

## 2018-03-28 DIAGNOSIS — E119 Type 2 diabetes mellitus without complications: Secondary | ICD-10-CM | POA: Diagnosis not present

## 2018-03-28 DIAGNOSIS — R69 Illness, unspecified: Secondary | ICD-10-CM | POA: Diagnosis not present

## 2018-03-28 DIAGNOSIS — Z1211 Encounter for screening for malignant neoplasm of colon: Secondary | ICD-10-CM | POA: Diagnosis not present

## 2018-03-28 MED ORDER — SODIUM CHLORIDE 0.9 % IV SOLN: 500.0000 mL | Freq: Once | INTRAVENOUS | Status: DC

## 2018-03-28 NOTE — Op Note (Signed)
[facility] Patient Name: James Camacho Procedure Date: 03/28/2018 1:29 PM MRN: [id number] Endoscopist: Jerene Bears , MD Age: 68 Referring MD:  Date of Birth: 1950-09-18 Gender: Male Account #: [id number] Procedure:                Colonoscopy Indications:              Surveillance: Personal history of adenomatous                            polyps on last colonoscopy 5 years ago Medicines:                Monitored Anesthesia Care Procedure:                Pre-Anesthesia Assessment:                           - Prior to the procedure, a History and Physical                            was performed, and patient medications and                            allergies were reviewed. The patient's tolerance of                            previous anesthesia was also reviewed. The risks                            and benefits of the procedure and the sedation                            options and risks were discussed with the patient.                            All questions were answered, and informed consent                            was obtained. Prior Anticoagulants: The patient has                            taken no previous anticoagulant or antiplatelet                            agents. ASA Grade Assessment: II - A patient with                            mild systemic disease. After reviewing the risks                            and benefits, the patient was deemed in                            satisfactory condition to undergo the procedure.  After obtaining informed consent, the colonoscope                            was passed under direct vision. Throughout the                            procedure, the patient's blood pressure, pulse, and                            oxygen saturations were monitored continuously. The                            Colonoscope was introduced through the anus and                            advanced to the cecum,  identified by appendiceal                            orifice and ileocecal valve. The colonoscopy was                            performed without difficulty. The patient tolerated                            the procedure well. The quality of the bowel                            preparation was good. The ileocecal valve,                            appendiceal orifice, and rectum were photographed. Scope In: 1:41:41 PM Scope Out: 2:05:37 PM Scope Withdrawal Time: 0 hours 20 minutes 31 seconds  Total Procedure Duration: 0 hours 23 minutes 56 seconds  Findings:                 The digital rectal exam was normal.                           A 6 mm polyp was found in the ascending colon. The                            polyp was sessile. The polyp was removed with a                            cold snare. Resection and retrieval were complete.                           A 5 mm polyp was found in the hepatic flexure. The                            polyp was sessile. The polyp was removed with a  cold snare. Resection and retrieval were complete.                           A 4 mm polyp was found in the descending colon. The                            polyp was sessile. The polyp was removed with a                            cold snare. Resection and retrieval were complete.                           Two sessile polyps were found in the sigmoid colon.                            The polyps were 4 to 5 mm in size. These polyps                            were removed with a cold snare. Resection and                            retrieval were complete.                           A small polyp was found in the anus. The polyp was                            sessile. This was biopsied with a cold forceps for                            histology and to exclude AIN.                           Internal hemorrhoids were found during                            retroflexion. The hemorrhoids  were small. Complications:            No immediate complications. Estimated Blood Loss:     Estimated blood loss was minimal. Impression:               - One 6 mm polyp in the ascending colon, removed                            with a cold snare. Resected and retrieved.                           - One 5 mm polyp at the hepatic flexure, removed                            with a cold snare. Resected and retrieved.                           -  One 4 mm polyp in the descending colon, removed                            with a cold snare. Resected and retrieved.                           - Two 4 to 5 mm polyps in the sigmoid colon,                            removed with a cold snare. Resected and retrieved.                           - One small polyp at the anus. Biopsied.                           - Internal hemorrhoids. Recommendation:           - Patient has a contact number available for                            emergencies. The signs and symptoms of potential                            delayed complications were discussed with the                            patient. Return to normal activities tomorrow.                            Written discharge instructions were provided to the                            patient.                           - Resume previous diet.                           - Continue present medications.                           - Await pathology results.                           - Repeat colonoscopy is recommended for                            surveillance. The colonoscopy date will be                            determined after pathology results from today's                            exam become available for review. Jerene Bears, MD 03/28/2018 2:12:59 PM This report has been signed electronically.

## 2018-03-28 NOTE — Patient Instructions (Signed)
Handouts Provided:  Polyps  YOU HAD AN ENDOSCOPIC PROCEDURE TODAY AT THE [facility]:   Refer to the procedure report that was given to you for any specific questions about what was found during the examination.  If the procedure report does not answer your questions, please call your gastroenterologist to clarify.  If you requested that your care partner not be given the details of your procedure findings, then the procedure report has been included in a sealed envelope for you to review at your convenience later.  YOU SHOULD EXPECT: Some feelings of bloating in the abdomen. Passage of more gas than usual.  Walking can help get rid of the air that was put into your GI tract during the procedure and reduce the bloating. If you had a lower endoscopy (such as a colonoscopy or flexible sigmoidoscopy) you may notice spotting of blood in your stool or on the toilet paper. If you underwent a bowel prep for your procedure, you may not have a normal bowel movement for a few days.  Please Note:  You might notice some irritation and congestion in your nose or some drainage.  This is from the oxygen used during your procedure.  There is no need for concern and it should clear up in a day or so.  SYMPTOMS TO REPORT IMMEDIATELY:   Following lower endoscopy (colonoscopy or flexible sigmoidoscopy):  Excessive amounts of blood in the stool  Significant tenderness or worsening of abdominal pains  Swelling of the abdomen that is new, acute  Fever of 100F or higher  For urgent or emergent issues, a gastroenterologist can be reached at any hour by calling [phone].   DIET:  We do recommend a small meal at first, but then you may proceed to your regular diet.  Drink plenty of fluids but you should avoid alcoholic beverages for 24 hours.  ACTIVITY:  You should plan to take it easy for the rest of today and you should NOT DRIVE or use heavy machinery until tomorrow (because of the sedation  medicines used during the test).    FOLLOW UP: Our staff will call the number listed on your records the next business day following your procedure to check on you and address any questions or concerns that you may have regarding the information given to you following your procedure. If we do not reach you, we will leave a message.  However, if you are feeling well and you are not experiencing any problems, there is no need to return our call.  We will assume that you have returned to your regular daily activities without incident.  If any biopsies were taken you will be contacted by phone or by letter within the next 1-3 weeks.  Please call us at [phone] if you have not heard about the biopsies in 3 weeks.    SIGNATURES/CONFIDENTIALITY: You and/or your care partner have signed paperwork which will be entered into your electronic medical record.  These signatures attest to the fact that that the information above on your After Visit Summary has been reviewed and is understood.  Full responsibility of the confidentiality of this discharge information lies with you and/or your care-partner.  

## 2018-03-28 NOTE — Progress Notes (Signed)
Called to room to assist during endoscopic procedure.  Patient ID and intended procedure confirmed with present staff. Received instructions for my participation in the procedure from the performing physician.  

## 2018-03-29 ENCOUNTER — Telehealth: Payer: Self-pay

## 2018-03-29 NOTE — Telephone Encounter (Signed)
  Follow up Call-  Call back number 03/28/2018  Post procedure Call Back phone  # [phone]  Permission to leave phone message Yes  Some recent data might be hidden     Patient questions:  Do you have a fever, pain , or abdominal swelling? No. Pain Score  0 *  Have you tolerated food without any problems? Yes.    Have you been able to return to your normal activities? Yes.    Do you have any questions about your discharge instructions: Diet   No. Medications  No. Follow up visit  No.  Do you have questions or concerns about your Care? No.  Actions: * If pain score is 4 or above: No action needed, pain <4.

## 2018-04-02 ENCOUNTER — Other Ambulatory Visit: Payer: Self-pay | Admitting: Internal Medicine

## 2018-04-04 ENCOUNTER — Encounter: Payer: Self-pay | Admitting: Internal Medicine

## 2018-04-05 ENCOUNTER — Telehealth: Payer: Self-pay

## 2018-04-05 ENCOUNTER — Encounter: Payer: Self-pay | Admitting: Internal Medicine

## 2018-04-05 ENCOUNTER — Telehealth: Payer: Self-pay | Admitting: Internal Medicine

## 2018-04-05 NOTE — Telephone Encounter (Signed)
Reviewed results with pt again over the phone, questions were answered.

## 2018-04-05 NOTE — Telephone Encounter (Signed)
error 

## 2018-04-05 NOTE — Telephone Encounter (Signed)
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 Marcello Moores, white, or gross.  Thanks, [employer]

## 2018-04-05 NOTE — Telephone Encounter (Signed)
Pt had colon 1.22.20 with Dr. Hilarie Fredrickson.  Pt wanted to know if pathology results came back malignant or benign.

## 2018-04-12 ENCOUNTER — Other Ambulatory Visit: Payer: Self-pay | Admitting: Internal Medicine

## 2018-04-23 DIAGNOSIS — R69 Illness, unspecified: Secondary | ICD-10-CM | POA: Diagnosis not present

## 2018-04-26 DIAGNOSIS — G4733 Obstructive sleep apnea (adult) (pediatric): Secondary | ICD-10-CM | POA: Diagnosis not present

## 2018-05-04 ENCOUNTER — Other Ambulatory Visit: Payer: Self-pay | Admitting: Internal Medicine

## 2018-05-25 DIAGNOSIS — G4733 Obstructive sleep apnea (adult) (pediatric): Secondary | ICD-10-CM | POA: Diagnosis not present

## 2018-05-28 ENCOUNTER — Other Ambulatory Visit: Payer: Self-pay | Admitting: Internal Medicine

## 2018-05-28 ENCOUNTER — Encounter: Payer: Self-pay | Admitting: Internal Medicine

## 2018-05-28 ENCOUNTER — Ambulatory Visit (INDEPENDENT_AMBULATORY_CARE_PROVIDER_SITE_OTHER): Payer: Medicare HMO | Admitting: Internal Medicine

## 2018-05-28 ENCOUNTER — Other Ambulatory Visit: Payer: Self-pay

## 2018-05-28 VITALS — BP 130/76 | HR 69 | Temp 97.5°F | Ht 68.0 in | Wt 244.0 lb

## 2018-05-28 DIAGNOSIS — R3915 Urgency of urination: Secondary | ICD-10-CM | POA: Diagnosis not present

## 2018-05-28 DIAGNOSIS — R350 Frequency of micturition: Secondary | ICD-10-CM | POA: Diagnosis not present

## 2018-05-28 DIAGNOSIS — E1165 Type 2 diabetes mellitus with hyperglycemia: Secondary | ICD-10-CM | POA: Diagnosis not present

## 2018-05-28 LAB — POC URINALSYSI DIPSTICK (AUTOMATED)
Bilirubin, UA: NEGATIVE
Blood, UA: NEGATIVE
Glucose, UA: POSITIVE — AB
KETONES UA: NEGATIVE
Leukocytes, UA: NEGATIVE
Nitrite, UA: NEGATIVE
PROTEIN UA: NEGATIVE
Spec Grav, UA: 1.015 (ref 1.010–1.025)
Urobilinogen, UA: 0.2 E.U./dL
pH, UA: 6.5 (ref 5.0–8.0)

## 2018-05-28 LAB — POCT GLYCOSYLATED HEMOGLOBIN (HGB A1C): Hemoglobin A1C: 11 % — AB (ref 4.0–5.6)

## 2018-05-28 MED ORDER — LIRAGLUTIDE 18 MG/3ML ~~LOC~~ SOPN: 1.8000 mg | PEN_INJECTOR | Freq: Every day | SUBCUTANEOUS | 11 refills | Status: DC

## 2018-05-28 NOTE — Patient Instructions (Signed)
Call me if your sugars aren't regularly below 140 in the next 2 weeks (when you are up to 1.8mg  daily on the liraglutide)

## 2018-05-28 NOTE — Progress Notes (Signed)
Subjective:    Patient ID: James Camacho, male    DOB: Mar 11, 1950, 68 y.o.   MRN: [id number]  HPI Here due to urinary symptoms  Has had increased urinary frequency Had UTI about a year ago---went to urgent care Feels the same now No burning dysuria---just urgency  Has been using AZO  Has cut back on soft drinks Tends to keep a dry mouth Has limited the furosemide---may have helped a little  Hasn't been checking sugars in the past month Was 130's before he stopped checking  Current Outpatient Medications on File Prior to Visit  Medication Sig Dispense Refill  . amLODipine (NORVASC) 10 MG tablet TAKE 1 TABLET (10 MG TOTAL) BY MOUTH DAILY. NEEDS OFFICE VISIT 90 tablet 1  . atorvastatin (LIPITOR) 10 MG tablet TAKE 1 TABLET BY MOUTH EVERY DAY 90 tablet 0  . carvedilol (COREG) 12.5 MG tablet TAKE 1 TABLET (12.5 MG TOTAL) BY MOUTH 2 (TWO) TIMES DAILY WITH A MEAL. 180 tablet 3  . clobetasol ointment (TEMOVATE) 0.05 % Apply topically 2 (two) times daily as needed. 30 g 1  . fluticasone (FLONASE) 50 MCG/ACT nasal spray Place 2 sprays into both nostrils daily. 16 g 1  . furosemide (LASIX) 40 MG tablet TAKE 1 TABLET BY MOUTH EVERY DAY 90 tablet 3  . lisinopril (PRINIVIL,ZESTRIL) 40 MG tablet TAKE 1 TABLET BY MOUTH EVERY DAY 90 tablet 1  . loperamide (IMODIUM A-D) 2 MG tablet Take 1 tablet (2 mg total) by mouth 4 (four) times daily as needed for diarrhea or loose stools. 30 tablet 0  . sildenafil (REVATIO) 20 MG tablet TAKE 3 TO 5 TABLETS DAILY AS DIRECTED 50 tablet 2   No current facility-administered medications on file prior to visit.     Allergies  Allergen Reactions  . Aleve [Naproxen Sodium] Hives    Also Advil  . Other     Pt can only take Tylenol for pain- anything prescribed that he has tried breaks him out in a rash.   . Penicillins Rash  . Shellfish Allergy Hives    Past Medical History:  Diagnosis Date  . Allergy    seasonal  . Anxiety   . Cataract 06/2017 and  07/2017   had surgery on both eyes  . Depression   . Diabetes mellitus without complication ([place])   . ED (erectile dysfunction)   . H/O seasonal allergies   . Hyperlipidemia    LDL in 120's  . Hypertension   . Sleep apnea    uses c-pap  . Wears hearing aid in both ears     Past Surgical History:  Procedure Laterality Date  . APPENDECTOMY     with chole  . CARDIOVASCULAR STRESS TEST  02/09   negative  . CATARACT EXTRACTION W/PHACO Right 06/28/2017   Procedure: CATARACT EXTRACTION PHACO AND INTRAOCULAR LENS PLACEMENT ([place]) RIGHT;  Surgeon: Leandrew Koyanagi, MD;  Location: [place];  Service: Ophthalmology;  Laterality: Right;  . CATARACT EXTRACTION W/PHACO Left 07/19/2017   Procedure: CATARACT EXTRACTION PHACO AND INTRAOCULAR LENS PLACEMENT ([place]) LEFT TOPICAL;  Surgeon: Leandrew Koyanagi, MD;  Location: [place];  Service: Ophthalmology;  Laterality: Left;  IVA TOPICALLEFTDIABETIC  . CHOLECYSTECTOMY     done by Dr Sharlet Salina in 1980's    Family History  Problem Relation Age of Onset  . Diabetes Mother   . Arthritis Mother   . Hypertension Mother   . Chronic Renal Failure Mother   . Parkinsonism Father   . Heart  disease Father   . Healthy Sister   . Healthy Brother   . Healthy Sister   . Cancer Neg Hx        no colon or prostate cancer  . Colon cancer Neg Hx   . Esophageal cancer Neg Hx   . Stomach cancer Neg Hx   . Rectal cancer Neg Hx     Social History   Socioeconomic History  . Marital status: Married    Spouse name: Not on file  . Number of children: 3  . Years of education: Not on file  . Highest education level: Not on file  Occupational History  . 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 Occupation: Engineer, site business    Comment:    Social Needs  . Financial resource strain: Not on file  . Food insecurity:    Worry: Not on file    Inability: Not on file  .  Transportation needs:    Medical: Not on file    Non-medical: Not on file  Tobacco Use  . Smoking status: Never Smoker  . Smokeless tobacco: Never Used  Substance and Sexual Activity  . Alcohol use: Yes    Comment: rare  . Drug use: No  . Sexual activity: Not on file  Lifestyle  . Physical activity:    Days per week: Not on file    Minutes per session: Not on file  . Stress: Not on file  Relationships  . Social connections:    Talks on phone: Not on file    Gets together: Not on file    Attends religious service: Not on file    Active member of club or organization: Not on file    Attends meetings of clubs or organizations: Not on file    Relationship status: Not on file  . Intimate partner violence:    Fear of current or ex partner: Not on file    Emotionally abused: Not on file    Physically abused: Not on file    Forced sexual activity: Not on file  Other Topics Concern  . Not on file  Social History Narrative   Has living will   Wife is health care POA--- children are alternates   Would accept resuscitation   No prolonged tube feeds   Review of Systems No fever Had fall a week ago---bruised left shoulder and chin (okay but sore from that) No N/V  No diarrhea  Appetite is okay    Objective:   Physical Exam  Constitutional: He appears well-developed. No distress.  GI: Soft. He exhibits no distension and no mass. There is no abdominal tenderness. There is no rebound and no guarding.  No suprapubic dullness   Genitourinary:    Genitourinary Comments: Prostate not enlarged and non tender   Musculoskeletal:     Comments: No CVA tenderness           Assessment & Plan:

## 2018-05-28 NOTE — Assessment & Plan Note (Addendum)
Given the glycosuria--will recheck the A1c Lab Results  Component Value Date   HGBA1C 11.0 (A) 05/28/2018   Unbelievable decline in control!! He admits to snacking and fast food Discussed options---will add liraglutide

## 2018-05-28 NOTE — Assessment & Plan Note (Signed)
Not sick and prostate not tender Urinalysis shows glycosuria---so this is likely the cause of the urinary symptoms Will check A1c

## 2018-05-29 NOTE — Telephone Encounter (Signed)
Please check with his pharmacist to see if any other -glutide is covered better. If not, send Rx for glipizide 5mg --- 1 bid (#60 x 11)

## 2018-05-30 DIAGNOSIS — G4733 Obstructive sleep apnea (adult) (pediatric): Secondary | ICD-10-CM | POA: Diagnosis not present

## 2018-05-30 LAB — URINE CULTURE
MICRO NUMBER:: 345546
Result:: NO GROWTH
SPECIMEN QUALITY:: ADEQUATE

## 2018-05-31 MED ORDER — GLIPIZIDE 5 MG PO TABS: 5.0000 mg | ORAL_TABLET | Freq: Two times a day (BID) | ORAL | 11 refills | Status: DC

## 2018-07-11 DIAGNOSIS — E119 Type 2 diabetes mellitus without complications: Secondary | ICD-10-CM | POA: Diagnosis not present

## 2018-07-11 DIAGNOSIS — Z880 Allergy status to penicillin: Secondary | ICD-10-CM | POA: Diagnosis not present

## 2018-07-11 DIAGNOSIS — G4733 Obstructive sleep apnea (adult) (pediatric): Secondary | ICD-10-CM | POA: Diagnosis not present

## 2018-07-11 DIAGNOSIS — Z7984 Long term (current) use of oral hypoglycemic drugs: Secondary | ICD-10-CM | POA: Diagnosis not present

## 2018-07-11 DIAGNOSIS — R69 Illness, unspecified: Secondary | ICD-10-CM | POA: Diagnosis not present

## 2018-07-11 DIAGNOSIS — I10 Essential (primary) hypertension: Secondary | ICD-10-CM | POA: Diagnosis not present

## 2018-07-11 DIAGNOSIS — Z8249 Family history of ischemic heart disease and other diseases of the circulatory system: Secondary | ICD-10-CM | POA: Diagnosis not present

## 2018-07-11 DIAGNOSIS — E785 Hyperlipidemia, unspecified: Secondary | ICD-10-CM | POA: Diagnosis not present

## 2018-07-11 DIAGNOSIS — Z833 Family history of diabetes mellitus: Secondary | ICD-10-CM | POA: Diagnosis not present

## 2018-07-23 DIAGNOSIS — R69 Illness, unspecified: Secondary | ICD-10-CM | POA: Diagnosis not present

## 2018-07-27 ENCOUNTER — Other Ambulatory Visit: Payer: Self-pay | Admitting: Internal Medicine

## 2018-07-27 DIAGNOSIS — G4733 Obstructive sleep apnea (adult) (pediatric): Secondary | ICD-10-CM | POA: Diagnosis not present

## 2018-08-02 ENCOUNTER — Other Ambulatory Visit: Payer: Self-pay | Admitting: Internal Medicine

## 2018-08-17 ENCOUNTER — Ambulatory Visit: Payer: Self-pay | Admitting: Surgery

## 2018-08-17 DIAGNOSIS — R69 Illness, unspecified: Secondary | ICD-10-CM | POA: Diagnosis not present

## 2018-08-17 NOTE — H&P (Signed)
James Camacho Documented: 08/17/2018 9:27 AM Location: [facility] Patient #: [id number] DOB: 1950-04-19 Married / Language: [name] / Race: White Male  History of Present Illness Adin Hector MD; 08/17/2018 1:53 PM) The patient is a 68 year old male who presents with anal lesions. Note for "Anal lesions": ` ` `    ` ` Patient sent for surgical consultation at the request of Dr. Zenovia Jarred with [facility] gastroenterology  Chief Complaint: Condyloma acuminata of anal canal.  ` ` The patient returns s/p CryoTx 04/2018, 06/2018  The patient is a pleasant gentleman with history of colon polyps. Went for his 5 year follow-up colonoscopy. Adenomatous polyps removed. Suspicious lesions in the anal canal noted. Not a large burden. Biopsy showed condyloma acuminatum. Surgical consultation requested. I saw him 3 months ago and ablated 5 small condyloma. He tolerated that well. Had a few more lesions still on 3 month follow-up. Cryoablated again. He feels better but wonders if he still has a few lesions still. Wish to be seen sooner. Moving his bowels every day. No fevers or chills. No evidence. No bleeding or drainage.    (Review of systems as stated in this history (HPI) or in the review of systems. Otherwise all other 12 point ROS are negative)   Allergies (Sabrina Canty, CMA; 08/17/2018 9:28 AM) Penicillins NSAIDs Advil *ANALGESICS - ANTI-INFLAMMATORY* Allergies Reconciled  Medication History (Sabrina Canty, CMA; 08/17/2018 9:28 AM) amLODIPine Besylate (10MG  Tablet, Oral) Active. Atorvastatin Calcium (10MG  Tablet, Oral) Active. Carvedilol (12.5MG  Tablet, Oral) Active. Fluticasone Propionate (50MCG/ACT Suspension, Nasal) Active. metFORMIN HCl ER (500MG  Tablet ER 24HR, Oral) Active. Furosemide (40MG  Tablet, Oral) Active. Lisinopril (40MG  Tablet, Oral) Active. Medications Reconciled    Vitals (Sabrina Canty CMA; 08/17/2018 9:28 AM)  08/17/2018 9:28 AM Weight: 249.25 lb Height: 68in Body Surface Area: 2.24 m Body Mass Index: 37.9 kg/m  Temp.: 97.18F(Temporal)  Pulse: 97 (Regular)  BP: 148/90 (Sitting, Left Arm, Standard)      Physical Exam Adin Hector MD; 08/17/2018 10:27 AM)  General Mental Status-Alert. General Appearance-Not in acute distress, Not Sickly. Orientation-Oriented X3. Hydration-Well hydrated. Voice-Normal.  Integumentary Global Assessment Upon inspection and palpation of skin surfaces of the - Axillae: non-tender, no inflammation or ulceration, no drainage. and Distribution of scalp and body hair is normal. General Characteristics Temperature - normal warmth is noted.  Head and Neck Head-normocephalic, atraumatic with no lesions or palpable masses. Face Global Assessment - atraumatic, no absence of expression. Neck Global Assessment - no abnormal movements, no bruit auscultated on the right, no bruit auscultated on the left, no decreased range of motion, non-tender. Trachea-midline. Thyroid Gland Characteristics - non-tender.  Eye Eyeball - Left-Extraocular movements intact, No Nystagmus. Eyeball - Right-Extraocular movements intact, No Nystagmus. Cornea - Left-No Hazy. Cornea - Right-No Hazy. Sclera/Conjunctiva - Left-No scleral icterus, No Discharge. Sclera/Conjunctiva - Right-No scleral icterus, No Discharge. Pupil - Left-Direct reaction to light normal. Pupil - Right-Direct reaction to light normal.  ENMT Ears Pinna - Left - no drainage observed, no generalized tenderness observed. Right - no drainage observed, no generalized tenderness observed. Nose and Sinuses External Inspection of the Nose - no destructive lesion observed. Inspection of the nares - Left - quiet respiration. Right - quiet respiration. Mouth and Throat Lips - Upper Lip - no fissures observed, no pallor noted. Lower Lip - no fissures observed, no pallor noted.  Nasopharynx - no discharge present. Oral Cavity/Oropharynx - Tongue - no dryness observed. Oral Mucosa - no cyanosis observed. Hypopharynx -  no evidence of airway distress observed.  Chest and Lung Exam Inspection Movements - Normal and Symmetrical. Accessory muscles - No use of accessory muscles in breathing. Palpation Palpation of the chest reveals - Non-tender. Auscultation Breath sounds - Normal and Clear.  Cardiovascular Auscultation Rhythm - Regular. Murmurs & Other Heart Sounds - Auscultation of the heart reveals - No Murmurs and No Systolic Clicks.  Abdomen Inspection Inspection of the abdomen reveals - No Visible peristalsis and No Abnormal pulsations. Umbilicus - No Bleeding, No Urine drainage. Palpation/Percussion Palpation and Percussion of the abdomen reveal - Soft, Non Tender, No Rebound tenderness, No Rigidity (guarding) and No Cutaneous hyperesthesia. Note: Abdomen morbidly obese but soft. Right upper quadrant oblique incision consistent with prior open cholecystectomy. Clean under panniculus. No panniculitis. Nontender. Not distended. No umbilical or incisional hernias. No guarding.  Male Genitourinary Sexual Maturity Tanner 5 - Adult hair pattern and Adult penile size and shape. Note: No inguinal hernias. Normal external genitalia. Epididymi, testes, and spermatic cords normal without any masses.  Rectal Note: Please see anoscopy.  Persistent internal condylomas.   Peripheral Vascular Upper Extremity Inspection - Left - No Cyanotic nailbeds, Not Ischemic. Right - No Cyanotic nailbeds, Not Ischemic.  Neurologic Neurologic evaluation reveals -normal attention span and ability to concentrate, able to name objects and repeat phrases. Appropriate fund of knowledge , normal sensation and normal coordination. Mental Status Affect - not angry, not paranoid. Cranial Nerves-Normal Bilaterally. Gait-Normal.  Neuropsychiatric Mental status exam  performed with findings of-able to articulate well with normal speech/language, rate, volume and coherence, thought content normal with ability to perform basic computations and apply abstract reasoning and no evidence of hallucinations, delusions, obsessions or homicidal/suicidal ideation.  Musculoskeletal Global Assessment Spine, Ribs and Pelvis - no instability, subluxation or laxity. Right Upper Extremity - no instability, subluxation or laxity.  Lymphatic Head & Neck  General Head & Neck Lymphatics: Bilateral - Description - No Localized lymphadenopathy. Axillary  General Axillary Region: Bilateral - Description - No Localized lymphadenopathy. Femoral & Inguinal  Generalized Femoral & Inguinal Lymphatics: Left - Description - No Localized lymphadenopathy. Right - Description - No Localized lymphadenopathy.   Results Adin Hector MD; 08/17/2018 1:53 PM) Procedures  Name Value Date Hemorrhoids Procedure Other: 1 mm right lateral firm. Area revealed tags. Not certain if they are truly condyloma versus whiteheads............Marland KitchenOn the inside by digital and anoscopic exam and is a circumference of small warts at the white line of [place]. Right anterior especially left lateral. Less burden than last time but persistent.  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 I think he is failed nonoperative management with 2 cryoablations earlier this year. I believe this requires anorectal examination under anesthesia with laser ablation and possible excision. He's not HIV-positive nor has unprotected anal receptive sex, so lower risk for recurrence once we can more definitively clear the region  Current Plans ANOSCOPY, DIAGNOSTIC (97353) The anatomy & physiology of the anorectal region was discussed. The pathophysiology of  anorectal warts and differential diagnosis was discussed. Natural history risks without surgery was discussed such as further growth and cancer. I stressed the importance of office follow-up to catch early recurrence & minimize/halt progression of disease. Interventions such as cauterization or cryotherapy by topical agents were discussed.  The patient's symptoms are not adequately controlled by non-operative treatments. I feel the risks & problems of  no surgery outweigh the operative risks; therefore, I recommended surgery to treat the anal warts by removal, ablation and/or cauterization.  Risks such as bleeding, infection, need for further treatment, heart attack, death, and other risks were discussed. I noted a good likelihood this will help address the problem. Goals of post-operative recovery were discussed as well. Possibility that this will not correct all symptoms was explained. Post-operative pain, bleeding, constipation, and other problems after surgery were discussed. We will work to minimize complications. Educational handouts further explaining the pathology, treatment options, and bowel regimen were given as well. Questions were answered. The patient expresses understanding & wishes to proceed with surgery.   ENCOUNTER FOR PREOPERATIVE EXAMINATION FOR GENERAL SURGICAL PROCEDURE (Z01.818)  Current Plans You are being scheduled for surgery- Our schedulers will call you.  You should hear from our office's scheduling department within 5 working days about the location, date, and time of surgery. We try to make accommodations for patient's preferences in scheduling surgery, but sometimes the OR schedule or the surgeon's schedule prevents [place] from making those accommodations.  If you have not heard from our office [phone]) in 5 working days, call the office and ask for your surgeon's nurse.  If you have other questions about your diagnosis, plan, or surgery, call the  office and ask for your surgeon's nurse.  Pt Education - [facility] Rectal Prep for Anorectal outpatient/office surgery: discussed with patient and provided information. Pt Education - [facility] Rectal Surgery HCI (): discussed with patient and provided information. Pt Education - [facility] Good Bowel Health () Pt Education - [facility] Anal Warts ()  Adin Hector, MD, FACS, MASCRS Gastrointestinal and Minimally Invasive Surgery    [address] [place], [place] [address] [phone] Main / Paging [phone] Fax

## 2018-08-19 ENCOUNTER — Other Ambulatory Visit: Payer: Self-pay | Admitting: Internal Medicine

## 2018-08-22 ENCOUNTER — Other Ambulatory Visit: Payer: Self-pay | Admitting: Internal Medicine

## 2018-08-27 DIAGNOSIS — G4733 Obstructive sleep apnea (adult) (pediatric): Secondary | ICD-10-CM | POA: Diagnosis not present

## 2018-08-29 ENCOUNTER — Ambulatory Visit (INDEPENDENT_AMBULATORY_CARE_PROVIDER_SITE_OTHER): Payer: Medicare HMO | Admitting: Internal Medicine

## 2018-08-29 ENCOUNTER — Encounter: Payer: Self-pay | Admitting: Internal Medicine

## 2018-08-29 ENCOUNTER — Other Ambulatory Visit: Payer: Self-pay

## 2018-08-29 VITALS — BP 110/70 | HR 76 | Temp 98.2°F | Ht 68.0 in | Wt 249.0 lb

## 2018-08-29 DIAGNOSIS — J0110 Acute frontal sinusitis, unspecified: Secondary | ICD-10-CM

## 2018-08-29 DIAGNOSIS — E1165 Type 2 diabetes mellitus with hyperglycemia: Secondary | ICD-10-CM | POA: Diagnosis not present

## 2018-08-29 LAB — POCT GLYCOSYLATED HEMOGLOBIN (HGB A1C): Hemoglobin A1C: 7.3 % — AB (ref 4.0–5.6)

## 2018-08-29 MED ORDER — FLUTICASONE PROPIONATE 50 MCG/ACT NA SUSP: 2.0000 | Freq: Every day | NASAL | 11 refills | Status: DC

## 2018-08-29 NOTE — Patient Instructions (Signed)
If you start the liraglutide--add it to your current medications--at 1.41ml daily. Let me know if you have any low sugar reactions.

## 2018-08-29 NOTE — Assessment & Plan Note (Signed)
Lab Results  Component Value Date   HGBA1C 7.3 (A) 08/29/2018   Control is much better Clearly has made lifestyle improvements Discussed adding the liraglutide if he can afford Reviewed Medicare enrollment---may need to compare plans for pharmacy Discussed what real exercise would be

## 2018-08-29 NOTE — Progress Notes (Signed)
Subjective:    Patient ID: James Camacho, male    DOB: 1950/06/05, 68 y.o.   MRN: [id number]  HPI Here for recheck of his out of control diabetes Has tolerated the glipizide Weight is up slightly  He is intrigued about liraglutide despite the cost  He does admit he was out of control with eating, etc He is now being much more careful with that Is walking a little---with dog 3 days per week Checking sugars bid Average around 125  Current Outpatient Medications on File Prior to Visit  Medication Sig Dispense Refill  . amLODipine (NORVASC) 10 MG tablet Take 1 tablet (10 mg total) by mouth daily. 90 tablet 0  . atorvastatin (LIPITOR) 10 MG tablet TAKE 1 TABLET BY MOUTH EVERY DAY 90 tablet 0  . carvedilol (COREG) 12.5 MG tablet TAKE 1 TABLET (12.5 MG TOTAL) BY MOUTH 2 (TWO) TIMES DAILY WITH A MEAL. 180 tablet 3  . clobetasol ointment (TEMOVATE) 0.05 % Apply topically 2 (two) times daily as needed. 30 g 1  . furosemide (LASIX) 40 MG tablet TAKE 1 TABLET BY MOUTH EVERY DAY 90 tablet 0  . glipiZIDE (GLUCOTROL) 5 MG tablet Take 1 tablet (5 mg total) by mouth 2 (two) times daily before a meal. 60 tablet 11  . lisinopril (ZESTRIL) 40 MG tablet TAKE 1 TABLET BY MOUTH EVERY DAY 90 tablet 0  . loperamide (IMODIUM A-D) 2 MG tablet Take 1 tablet (2 mg total) by mouth 4 (four) times daily as needed for diarrhea or loose stools. 30 tablet 0  . metFORMIN (GLUCOPHAGE-XR) 500 MG 24 hr tablet TAKE 1 TABLET BY MOUTH EVERY DAY WITH BREAKFAST 90 tablet 3  . sildenafil (REVATIO) 20 MG tablet TAKE 3 TO 5 TABLETS DAILY AS DIRECTED 50 tablet 2   No current facility-administered medications on file prior to visit.     Allergies  Allergen Reactions  . Aleve [Naproxen Sodium] Hives    Also Advil  . Other     Pt can only take Tylenol for pain- anything prescribed that he has tried breaks him out in a rash.   . Penicillins Rash  . Shellfish Allergy Hives    Past Medical History:  Diagnosis Date  .  Allergy    seasonal  . Anxiety   . Cataract 06/2017 and 07/2017   had surgery on both eyes  . Depression   . Diabetes mellitus without complication ([place])   . ED (erectile dysfunction)   . H/O seasonal allergies   . Hyperlipidemia    LDL in 120's  . Hypertension   . Sleep apnea    uses c-pap  . Wears hearing aid in both ears     Past Surgical History:  Procedure Laterality Date  . APPENDECTOMY     with chole  . CARDIOVASCULAR STRESS TEST  02/09   negative  . CATARACT EXTRACTION W/PHACO Right 06/28/2017   Procedure: CATARACT EXTRACTION PHACO AND INTRAOCULAR LENS PLACEMENT ([place]) RIGHT;  Surgeon: Leandrew Koyanagi, MD;  Location: [place];  Service: Ophthalmology;  Laterality: Right;  . CATARACT EXTRACTION W/PHACO Left 07/19/2017   Procedure: CATARACT EXTRACTION PHACO AND INTRAOCULAR LENS PLACEMENT ([place]) LEFT TOPICAL;  Surgeon: Leandrew Koyanagi, MD;  Location: [place];  Service: Ophthalmology;  Laterality: Left;  IVA TOPICALLEFTDIABETIC  . CHOLECYSTECTOMY     done by Dr Sharlet Salina in 1980's    Family History  Problem Relation Age of Onset  . Diabetes Mother   . Arthritis Mother   .  Hypertension Mother   . Chronic Renal Failure Mother   . Parkinsonism Father   . Heart disease Father   . Healthy Sister   . Healthy Brother   . Healthy Sister   . Cancer Neg Hx        no colon or prostate cancer  . Colon cancer Neg Hx   . Esophageal cancer Neg Hx   . Stomach cancer Neg Hx   . Rectal cancer Neg Hx     Social History   Socioeconomic History  . Marital status: Married    Spouse name: Not on file  . Number of children: 3  . Years of education: Not on file  . Highest education level: Not on file  Occupational History  . 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 Financial resource strain: Not on file  . Food insecurity     Worry: Not on file    Inability: Not on file  . Transportation needs    Medical: Not on file    Non-medical: Not on file  Tobacco Use  . Smoking status: Never Smoker  . Smokeless tobacco: Never Used  Substance and Sexual Activity  . Alcohol use: Yes    Comment: rare  . Drug use: No  . Sexual activity: Not on file  Lifestyle  . Physical activity    Days per week: Not on file    Minutes per session: Not on file  . Stress: Not on file  Relationships  . Social Herbalist on phone: Not on file    Gets together: Not on file    Attends religious service: Not on file    Active member of club or organization: Not on file    Attends meetings of clubs or organizations: Not on file    Relationship status: Not on file  . Intimate partner violence    Fear of current or ex partner: Not on file    Emotionally abused: Not on file    Physically abused: Not on file    Forced sexual activity: Not on file  Other Topics Concern  . Not on file  Social History Narrative   Has living will   Wife is health care POA--- children are alternates   Would accept resuscitation   No prolonged tube feeds   Review of Systems Still working full time--on call transport system Sleeping okay--uses CPAP every night    Objective:   Physical Exam  Constitutional: He appears well-developed. No distress.  Psychiatric: He has a normal mood and affect. His behavior is normal.           Assessment & Plan:

## 2018-09-05 MED ORDER — PEN NEEDLES 32G X 6 MM MISC: 1.0000 | Freq: Every day | 11 refills | Status: DC

## 2018-09-24 ENCOUNTER — Inpatient Hospital Stay (HOSPITAL_COMMUNITY): Admission: RE | Admit: 2018-09-24 | Payer: Medicare HMO | Source: Ambulatory Visit

## 2018-09-27 ENCOUNTER — Encounter (HOSPITAL_BASED_OUTPATIENT_CLINIC_OR_DEPARTMENT_OTHER): Payer: Self-pay

## 2018-09-27 ENCOUNTER — Ambulatory Visit (HOSPITAL_BASED_OUTPATIENT_CLINIC_OR_DEPARTMENT_OTHER): Admit: 2018-09-27 | Payer: Medicare HMO | Admitting: Surgery

## 2018-09-27 DIAGNOSIS — G4733 Obstructive sleep apnea (adult) (pediatric): Secondary | ICD-10-CM | POA: Diagnosis not present

## 2018-09-27 SURGERY — ABLATION, CONDYLOMA, USING LASER
Anesthesia: General

## 2018-10-15 ENCOUNTER — Other Ambulatory Visit: Payer: Self-pay | Admitting: Internal Medicine

## 2018-10-23 ENCOUNTER — Other Ambulatory Visit: Payer: Self-pay | Admitting: Internal Medicine

## 2018-10-30 DIAGNOSIS — M4306 Spondylolysis, lumbar region: Secondary | ICD-10-CM | POA: Diagnosis not present

## 2018-10-30 DIAGNOSIS — G4733 Obstructive sleep apnea (adult) (pediatric): Secondary | ICD-10-CM | POA: Diagnosis not present

## 2018-10-30 DIAGNOSIS — M5431 Sciatica, right side: Secondary | ICD-10-CM | POA: Diagnosis not present

## 2018-10-30 DIAGNOSIS — M9905 Segmental and somatic dysfunction of pelvic region: Secondary | ICD-10-CM | POA: Diagnosis not present

## 2018-10-30 DIAGNOSIS — M9903 Segmental and somatic dysfunction of lumbar region: Secondary | ICD-10-CM | POA: Diagnosis not present

## 2018-10-31 DIAGNOSIS — M9903 Segmental and somatic dysfunction of lumbar region: Secondary | ICD-10-CM | POA: Diagnosis not present

## 2018-10-31 DIAGNOSIS — M4306 Spondylolysis, lumbar region: Secondary | ICD-10-CM | POA: Diagnosis not present

## 2018-10-31 DIAGNOSIS — M9905 Segmental and somatic dysfunction of pelvic region: Secondary | ICD-10-CM | POA: Diagnosis not present

## 2018-10-31 DIAGNOSIS — M5431 Sciatica, right side: Secondary | ICD-10-CM | POA: Diagnosis not present

## 2018-11-05 DIAGNOSIS — M9905 Segmental and somatic dysfunction of pelvic region: Secondary | ICD-10-CM | POA: Diagnosis not present

## 2018-11-05 DIAGNOSIS — M9903 Segmental and somatic dysfunction of lumbar region: Secondary | ICD-10-CM | POA: Diagnosis not present

## 2018-11-05 DIAGNOSIS — H1851 Endothelial corneal dystrophy: Secondary | ICD-10-CM | POA: Diagnosis not present

## 2018-11-05 DIAGNOSIS — H524 Presbyopia: Secondary | ICD-10-CM | POA: Diagnosis not present

## 2018-11-05 DIAGNOSIS — M5431 Sciatica, right side: Secondary | ICD-10-CM | POA: Diagnosis not present

## 2018-11-05 DIAGNOSIS — Z0100 Encounter for examination of eyes and vision without abnormal findings: Secondary | ICD-10-CM | POA: Diagnosis not present

## 2018-11-05 DIAGNOSIS — M4306 Spondylolysis, lumbar region: Secondary | ICD-10-CM | POA: Diagnosis not present

## 2018-11-06 DIAGNOSIS — M9903 Segmental and somatic dysfunction of lumbar region: Secondary | ICD-10-CM | POA: Diagnosis not present

## 2018-11-06 DIAGNOSIS — M4306 Spondylolysis, lumbar region: Secondary | ICD-10-CM | POA: Diagnosis not present

## 2018-11-06 DIAGNOSIS — M9905 Segmental and somatic dysfunction of pelvic region: Secondary | ICD-10-CM | POA: Diagnosis not present

## 2018-11-06 DIAGNOSIS — M5431 Sciatica, right side: Secondary | ICD-10-CM | POA: Diagnosis not present

## 2018-11-09 DIAGNOSIS — M9903 Segmental and somatic dysfunction of lumbar region: Secondary | ICD-10-CM | POA: Diagnosis not present

## 2018-11-09 DIAGNOSIS — M4306 Spondylolysis, lumbar region: Secondary | ICD-10-CM | POA: Diagnosis not present

## 2018-11-09 DIAGNOSIS — M9905 Segmental and somatic dysfunction of pelvic region: Secondary | ICD-10-CM | POA: Diagnosis not present

## 2018-11-09 DIAGNOSIS — M5431 Sciatica, right side: Secondary | ICD-10-CM | POA: Diagnosis not present

## 2018-11-13 ENCOUNTER — Other Ambulatory Visit: Payer: Self-pay | Admitting: Internal Medicine

## 2018-11-13 DIAGNOSIS — M5431 Sciatica, right side: Secondary | ICD-10-CM | POA: Diagnosis not present

## 2018-11-13 DIAGNOSIS — M4306 Spondylolysis, lumbar region: Secondary | ICD-10-CM | POA: Diagnosis not present

## 2018-11-13 DIAGNOSIS — M9905 Segmental and somatic dysfunction of pelvic region: Secondary | ICD-10-CM | POA: Diagnosis not present

## 2018-11-13 DIAGNOSIS — M9903 Segmental and somatic dysfunction of lumbar region: Secondary | ICD-10-CM | POA: Diagnosis not present

## 2018-11-15 DIAGNOSIS — M9905 Segmental and somatic dysfunction of pelvic region: Secondary | ICD-10-CM | POA: Diagnosis not present

## 2018-11-15 DIAGNOSIS — M9903 Segmental and somatic dysfunction of lumbar region: Secondary | ICD-10-CM | POA: Diagnosis not present

## 2018-11-15 DIAGNOSIS — M4306 Spondylolysis, lumbar region: Secondary | ICD-10-CM | POA: Diagnosis not present

## 2018-11-15 DIAGNOSIS — M5431 Sciatica, right side: Secondary | ICD-10-CM | POA: Diagnosis not present

## 2018-11-20 ENCOUNTER — Other Ambulatory Visit: Payer: Self-pay | Admitting: Internal Medicine

## 2018-11-20 DIAGNOSIS — R21 Rash and other nonspecific skin eruption: Secondary | ICD-10-CM

## 2018-11-21 DIAGNOSIS — M5431 Sciatica, right side: Secondary | ICD-10-CM | POA: Diagnosis not present

## 2018-11-21 DIAGNOSIS — M4306 Spondylolysis, lumbar region: Secondary | ICD-10-CM | POA: Diagnosis not present

## 2018-11-21 DIAGNOSIS — M9905 Segmental and somatic dysfunction of pelvic region: Secondary | ICD-10-CM | POA: Diagnosis not present

## 2018-11-21 DIAGNOSIS — M9903 Segmental and somatic dysfunction of lumbar region: Secondary | ICD-10-CM | POA: Diagnosis not present

## 2018-11-22 ENCOUNTER — Other Ambulatory Visit: Payer: Self-pay

## 2018-11-22 DIAGNOSIS — M9903 Segmental and somatic dysfunction of lumbar region: Secondary | ICD-10-CM | POA: Diagnosis not present

## 2018-11-22 DIAGNOSIS — M9905 Segmental and somatic dysfunction of pelvic region: Secondary | ICD-10-CM | POA: Diagnosis not present

## 2018-11-22 DIAGNOSIS — M5431 Sciatica, right side: Secondary | ICD-10-CM | POA: Diagnosis not present

## 2018-11-22 DIAGNOSIS — M4306 Spondylolysis, lumbar region: Secondary | ICD-10-CM | POA: Diagnosis not present

## 2018-11-22 MED ORDER — FUROSEMIDE 40 MG PO TABS: 40.0000 mg | ORAL_TABLET | Freq: Every day | ORAL | 0 refills | Status: DC

## 2018-11-27 DIAGNOSIS — M9905 Segmental and somatic dysfunction of pelvic region: Secondary | ICD-10-CM | POA: Diagnosis not present

## 2018-11-27 DIAGNOSIS — M9903 Segmental and somatic dysfunction of lumbar region: Secondary | ICD-10-CM | POA: Diagnosis not present

## 2018-11-27 DIAGNOSIS — M5431 Sciatica, right side: Secondary | ICD-10-CM | POA: Diagnosis not present

## 2018-11-27 DIAGNOSIS — M4306 Spondylolysis, lumbar region: Secondary | ICD-10-CM | POA: Diagnosis not present

## 2018-11-29 DIAGNOSIS — M4306 Spondylolysis, lumbar region: Secondary | ICD-10-CM | POA: Diagnosis not present

## 2018-11-29 DIAGNOSIS — M9905 Segmental and somatic dysfunction of pelvic region: Secondary | ICD-10-CM | POA: Diagnosis not present

## 2018-11-29 DIAGNOSIS — M9903 Segmental and somatic dysfunction of lumbar region: Secondary | ICD-10-CM | POA: Diagnosis not present

## 2018-11-29 DIAGNOSIS — M5431 Sciatica, right side: Secondary | ICD-10-CM | POA: Diagnosis not present

## 2018-11-30 DIAGNOSIS — G4733 Obstructive sleep apnea (adult) (pediatric): Secondary | ICD-10-CM | POA: Diagnosis not present

## 2018-12-10 DIAGNOSIS — M4306 Spondylolysis, lumbar region: Secondary | ICD-10-CM | POA: Diagnosis not present

## 2018-12-10 DIAGNOSIS — M9905 Segmental and somatic dysfunction of pelvic region: Secondary | ICD-10-CM | POA: Diagnosis not present

## 2018-12-10 DIAGNOSIS — M9903 Segmental and somatic dysfunction of lumbar region: Secondary | ICD-10-CM | POA: Diagnosis not present

## 2018-12-10 DIAGNOSIS — M5431 Sciatica, right side: Secondary | ICD-10-CM | POA: Diagnosis not present

## 2018-12-13 ENCOUNTER — Other Ambulatory Visit: Payer: Self-pay

## 2018-12-13 ENCOUNTER — Encounter (HOSPITAL_BASED_OUTPATIENT_CLINIC_OR_DEPARTMENT_OTHER): Payer: Self-pay | Admitting: *Deleted

## 2018-12-13 NOTE — Progress Notes (Signed)
Spoke w/ via phone for pre-op interview--- PT Lab needs dos----   [place] test ------  @ [facility] 12-17-2018 Arrive at ------- 0530 NPO after ------ [place] Medications to take morning of surgery ----- Coreg, Norvasc, Lipitor Diabetic medication ----- do not take glipizide/ metformin morning of surgery Patient Special Instructions -----  Bring cpap mask/ tubing dos Pre-Op special Istructions ----- pre-op orders pending  Patient verbalized understanding of instructions that were given at this phone interview. Patient denies shortness of breath, chest pain, fever, cough a this phone interview.  Anesthesia:  PCP:  Dr Silvio Pate  (lov visit in epic) Cardiologist :  no Chest x-ray :  04-16-2007 epic EKG : 11-18-2013 epic Echo :  No Stress test:  ETT 04-17-2007 epic Cardiac Cath :  No Sleep Study/ CPAP : Yes/ Yes Fasting Blood Sugar :   115-120   / Checks Blood Sugar -- times a day:  3 times per week  Blood Thinner/ Instructions /Last Dose:  NO ASA / Instructions/ Last Dose :  NO  Patient denies shortness of breath, chest pain, fever, and cough at this phone interview.

## 2018-12-17 ENCOUNTER — Other Ambulatory Visit: Payer: Self-pay

## 2018-12-17 ENCOUNTER — Other Ambulatory Visit
Admission: RE | Admit: 2018-12-17 | Discharge: 2018-12-17 | Disposition: A | Discharge status: Home / Self Care | Payer: Medicare HMO | Source: Ambulatory Visit | Attending: Surgery | Admitting: Surgery

## 2018-12-17 DIAGNOSIS — Z01812 Encounter for preprocedural laboratory examination: Secondary | ICD-10-CM | POA: Diagnosis not present

## 2018-12-17 DIAGNOSIS — Z20828 Contact with and (suspected) exposure to other viral communicable diseases: Secondary | ICD-10-CM | POA: Insufficient documentation

## 2018-12-17 LAB — SARS CORONAVIRUS 2 (TAT 6-24 HRS): SARS Coronavirus 2: NEGATIVE

## 2018-12-18 DIAGNOSIS — M4306 Spondylolysis, lumbar region: Secondary | ICD-10-CM | POA: Diagnosis not present

## 2018-12-18 DIAGNOSIS — M9905 Segmental and somatic dysfunction of pelvic region: Secondary | ICD-10-CM | POA: Diagnosis not present

## 2018-12-18 DIAGNOSIS — M9903 Segmental and somatic dysfunction of lumbar region: Secondary | ICD-10-CM | POA: Diagnosis not present

## 2018-12-18 DIAGNOSIS — M5431 Sciatica, right side: Secondary | ICD-10-CM | POA: Diagnosis not present

## 2018-12-19 ENCOUNTER — Ambulatory Visit: Payer: Self-pay | Admitting: Surgery

## 2018-12-19 NOTE — H&P (View-Only) (Signed)
[name] Documented: 08/17/2018 9:27 AM Location: [facility] Patient #: [id number] DOB: [birth year] Married / Language: [name] / Race: White Male  History of Present Illness Adin Hector MD; 08/17/2018 1:53 PM) The patient is a 68 year old male who presents with anal lesions. Note for "Anal lesions": ` ` `    ` ` Patient sent for surgical consultation at the request of Dr. Zenovia Jarred with [facility] gastroenterology  Chief Complaint: Condyloma acuminata of anal canal.  ` ` The patient returns s/p CryoTx 04/2018, 06/2018  The patient is a pleasant gentleman with history of colon polyps. Went for his 5 year follow-up colonoscopy. Adenomatous polyps removed. Suspicious lesions in the anal canal noted. Not a large burden. Biopsy showed condyloma acuminatum. Surgical consultation requested. I saw him 3 months ago and ablated 5 small condyloma. He tolerated that well. Had a few more lesions still on 3 month follow-up. Cryoablated again. He feels better but wonders if he still has a few lesions still. Wish to be seen sooner. Moving his bowels every day. No fevers or chills. No evidence. No bleeding or drainage.  No new events,.  Ready for surgery this Fall    (Review of systems as stated in this history (HPI) or in the review of systems. Otherwise all other 12 point ROS are negative)   Allergies (Sabrina Canty, CMA; 08/17/2018 9:28 AM) Penicillins NSAIDs Advil *ANALGESICS - ANTI-INFLAMMATORY* Allergies Reconciled  Medication History (Sabrina Canty, CMA; 08/17/2018 9:28 AM) amLODIPine Besylate (10MG  Tablet, Oral) Active. Atorvastatin Calcium (10MG  Tablet, Oral) Active. Carvedilol (12.5MG  Tablet, Oral) Active. Fluticasone Propionate (50MCG/ACT Suspension, Nasal) Active. metFORMIN HCl ER (500MG  Tablet ER 24HR, Oral) Active. Furosemide (40MG  Tablet, Oral) Active. Lisinopril (40MG  Tablet, Oral) Active. Medications  Reconciled    Vitals (Sabrina Canty CMA; 08/17/2018 9:28 AM) 08/17/2018 9:28 AM Weight: 249.25 lb Height: 68in Body Surface Area: 2.24 m Body Mass Index: 37.9 kg/m  Temp.: 97.72F(Temporal)  Pulse: 97 (Regular)  BP: 148/90 (Sitting, Left Arm, Standard)   12/20/2018 There were no vitals taken for this visit.    Physical Exam Adin Hector MD; 08/17/2018 10:27 AM)  General Mental Status-Alert. General Appearance-Not in acute distress, Not Sickly. Orientation-Oriented X3. Hydration-Well hydrated. Voice-Normal.  Integumentary Global Assessment Upon inspection and palpation of skin surfaces of the - Axillae: non-tender, no inflammation or ulceration, no drainage. and Distribution of scalp and body hair is normal. General Characteristics Temperature - normal warmth is noted.  Head and Neck Head-normocephalic, atraumatic with no lesions or palpable masses. Face Global Assessment - atraumatic, no absence of expression. Neck Global Assessment - no abnormal movements, no bruit auscultated on the right, no bruit auscultated on the left, no decreased range of motion, non-tender. Trachea-midline. Thyroid Gland Characteristics - non-tender.  Eye Eyeball - Left-Extraocular movements intact, No Nystagmus. Eyeball - Right-Extraocular movements intact, No Nystagmus. Cornea - Left-No Hazy. Cornea - Right-No Hazy. Sclera/Conjunctiva - Left-No scleral icterus, No Discharge. Sclera/Conjunctiva - Right-No scleral icterus, No Discharge. Pupil - Left-Direct reaction to light normal. Pupil - Right-Direct reaction to light normal.  ENMT Ears Pinna - Left - no drainage observed, no generalized tenderness observed. Right - no drainage observed, no generalized tenderness observed. Nose and Sinuses External Inspection of the Nose - no destructive lesion observed. Inspection of the nares - Left - quiet respiration. Right - quiet  respiration. Mouth and Throat Lips - Upper Lip - no fissures observed, no pallor noted. Lower Lip - no fissures observed, no pallor noted. Nasopharynx -  no discharge present. Oral Cavity/Oropharynx - Tongue - no dryness observed. Oral Mucosa - no cyanosis observed. Hypopharynx - no evidence of airway distress observed.  Chest and Lung Exam Inspection Movements - Normal and Symmetrical. Accessory muscles - No use of accessory muscles in breathing. Palpation Palpation of the chest reveals - Non-tender. Auscultation Breath sounds - Normal and Clear.  Cardiovascular Auscultation Rhythm - Regular. Murmurs & Other Heart Sounds - Auscultation of the heart reveals - No Murmurs and No Systolic Clicks.  Abdomen Inspection Inspection of the abdomen reveals - No Visible peristalsis and No Abnormal pulsations. Umbilicus - No Bleeding, No Urine drainage. Palpation/Percussion Palpation and Percussion of the abdomen reveal - Soft, Non Tender, No Rebound tenderness, No Rigidity (guarding) and No Cutaneous hyperesthesia. Note: Abdomen morbidly obese but soft. Right upper quadrant oblique incision consistent with prior open cholecystectomy. Clean under panniculus. No panniculitis. Nontender. Not distended. No umbilical or incisional hernias. No guarding.  Male Genitourinary Sexual Maturity Tanner 5 - Adult hair pattern and Adult penile size and shape. Note: No inguinal hernias. Normal external genitalia. Epididymi, testes, and spermatic cords normal without any masses.  Rectal Note: Please see anoscopy.  Persistent internal condylomas.   Peripheral Vascular Upper Extremity Inspection - Left - No Cyanotic nailbeds, Not Ischemic. Right - No Cyanotic nailbeds, Not Ischemic.  Neurologic Neurologic evaluation reveals -normal attention span and ability to concentrate, able to name objects and repeat phrases. Appropriate fund of knowledge , normal sensation and normal coordination.  Mental Status Affect - not angry, not paranoid. Cranial Nerves-Normal Bilaterally. Gait-Normal.  Neuropsychiatric Mental status exam performed with findings of-able to articulate well with normal speech/language, rate, volume and coherence, thought content normal with ability to perform basic computations and apply abstract reasoning and no evidence of hallucinations, delusions, obsessions or homicidal/suicidal ideation.  Musculoskeletal Global Assessment Spine, Ribs and Pelvis - no instability, subluxation or laxity. Right Upper Extremity - no instability, subluxation or laxity.  Lymphatic Head & Neck  General Head & Neck Lymphatics: Bilateral - Description - No Localized lymphadenopathy. Axillary  General Axillary Region: Bilateral - Description - No Localized lymphadenopathy. Femoral & Inguinal  Generalized Femoral & Inguinal Lymphatics: Left - Description - No Localized lymphadenopathy. Right - Description - No Localized lymphadenopathy.   Results Adin Hector MD; 08/17/2018 1:53 PM) Procedures  Name Value Date Hemorrhoids Procedure Other: 1 mm right lateral firm. Area revealed tags. Not certain if they are truly condyloma versus whiteheads............Marland KitchenOn the inside by digital and anoscopic exam and is a circumference of small warts at the white line of [place]. Right anterior especially left lateral. Less burden than last time but persistent.  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 I think he is failed nonoperative management with 2 cryoablations earlier this year. I believe this requires anorectal examination under anesthesia with laser ablation and possible excision. He's not HIV-positive nor has unprotected anal receptive sex, so lower risk for recurrence once we can more definitively  clear the region  Current Plans ANOSCOPY, DIAGNOSTIC [id number]) The anatomy & physiology of the anorectal region was discussed. The pathophysiology of anorectal warts and differential diagnosis was discussed. Natural history risks without surgery was discussed such as further growth and cancer. I stressed the importance of office follow-up to catch early recurrence & minimize/halt progression of disease. Interventions such as cauterization or cryotherapy by topical agents were  discussed.  The patient's symptoms are not adequately controlled by non-operative treatments. I feel the risks & problems of no surgery outweigh the operative risks; therefore, I recommended surgery to treat the anal warts by removal, ablation and/or cauterization.  Risks such as bleeding, infection, need for further treatment, heart attack, death, and other risks were discussed. I noted a good likelihood this will help address the problem. Goals of post-operative recovery were discussed as well. Possibility that this will not correct all symptoms was explained. Post-operative pain, bleeding, constipation, and other problems after surgery were discussed. We will work to minimize complications. Educational handouts further explaining the pathology, treatment options, and bowel regimen were given as well. Questions were answered. The patient expresses understanding & wishes to proceed with surgery.   ENCOUNTER FOR PREOPERATIVE EXAMINATION FOR GENERAL SURGICAL PROCEDURE (Z01.818)  Current Plans You are being scheduled for surgery- Our schedulers will call you.  You should hear from our office's scheduling department within 5 working days about the location, date, and time of surgery. We try to make accommodations for patient's preferences in scheduling surgery, but sometimes the OR schedule or the surgeon's schedule prevents [place] from making those accommodations.  If you have not heard from our office  [phone]) in 5 working days, call the office and ask for your surgeon's nurse.  If you have other questions about your diagnosis, plan, or surgery, call the office and ask for your surgeon's nurse.  Pt Education - [facility] Rectal Prep for Anorectal outpatient/office surgery: discussed with patient and provided information. Pt Education - [facility] Rectal Surgery HCI (): discussed with patient and provided information. Pt Education - [facility] Good Bowel Health () Pt Education - [facility] Anal Warts ()  Adin Hector, MD, FACS, MASCRS Gastrointestinal and Minimally Invasive Surgery    [address] [place], [place] [address] [phone] Main / Paging [phone] Fax

## 2018-12-19 NOTE — H&P (Signed)
James Camacho Documented: 08/17/2018 9:27 AM Location: [facility] Patient #: [id number] DOB: 03-19-1950 Married / Language: James Camacho / Race: White Male  History of Present Illness James Hector MD; 08/17/2018 1:53 PM) The patient is a 68 year old male who presents with anal lesions. Note for "Anal lesions": ` ` `    ` ` Patient sent for surgical consultation at the request of Dr. Zenovia Jarred with [facility] gastroenterology  Chief Complaint: Condyloma acuminata of anal canal.  ` ` The patient returns s/p CryoTx 04/2018, 06/2018  The patient is a pleasant gentleman with history of colon polyps. Went for his 5 year follow-up colonoscopy. Adenomatous polyps removed. Suspicious lesions in the anal canal noted. Not a large burden. Biopsy showed condyloma acuminatum. Surgical consultation requested. I saw him 3 months ago and ablated 5 small condyloma. He tolerated that well. Had a few more lesions still on 3 month follow-up. Cryoablated again. He feels better but wonders if he still has a few lesions still. Wish to be seen sooner. Moving his bowels every day. No fevers or chills. No evidence. No bleeding or drainage.  No new events,.  Ready for surgery this Fall    (Review of systems as stated in this history (HPI) or in the review of systems. Otherwise all other 12 point ROS are negative)   Allergies (James Camacho, CMA; 08/17/2018 9:28 AM) Penicillins NSAIDs Advil *ANALGESICS - ANTI-INFLAMMATORY* Allergies Reconciled  Medication History (James Camacho, CMA; 08/17/2018 9:28 AM) amLODIPine Besylate (10MG  Tablet, Oral) Active. Atorvastatin Calcium (10MG  Tablet, Oral) Active. Carvedilol (12.5MG  Tablet, Oral) Active. Fluticasone Propionate (50MCG/ACT Suspension, Nasal) Active. metFORMIN HCl ER (500MG  Tablet ER 24HR, Oral) Active. Furosemide (40MG  Tablet, Oral) Active. Lisinopril (40MG  Tablet, Oral) Active. Medications  Reconciled    Vitals (James Camacho CMA; 08/17/2018 9:28 AM) 08/17/2018 9:28 AM Weight: 249.25 lb Height: 68in Body Surface Area: 2.24 m Body Mass Index: 37.9 kg/m  Temp.: 97.49F(Temporal)  Pulse: 97 (Regular)  BP: 148/90 (Sitting, Left Arm, Standard)   12/20/2018 There were no vitals taken for this visit.    Physical Exam James Hector MD; 08/17/2018 10:27 AM)  General Mental Status-Alert. General Appearance-Not in acute distress, Not Sickly. Orientation-Oriented X3. Hydration-Well hydrated. Voice-Normal.  Integumentary Global Assessment Upon inspection and palpation of skin surfaces of the - Axillae: non-tender, no inflammation or ulceration, no drainage. and Distribution of scalp and body hair is normal. General Characteristics Temperature - normal warmth is noted.  Head and Neck Head-normocephalic, atraumatic with no lesions or palpable masses. Face Global Assessment - atraumatic, no absence of expression. Neck Global Assessment - no abnormal movements, no bruit auscultated on the right, no bruit auscultated on the left, no decreased range of motion, non-tender. Trachea-midline. Thyroid Gland Characteristics - non-tender.  Eye Eyeball - Left-Extraocular movements intact, No Nystagmus. Eyeball - Right-Extraocular movements intact, No Nystagmus. Cornea - Left-No Hazy. Cornea - Right-No Hazy. Sclera/Conjunctiva - Left-No scleral icterus, No Discharge. Sclera/Conjunctiva - Right-No scleral icterus, No Discharge. Pupil - Left-Direct reaction to light normal. Pupil - Right-Direct reaction to light normal.  ENMT Ears Pinna - Left - no drainage observed, no generalized tenderness observed. Right - no drainage observed, no generalized tenderness observed. Nose and Sinuses External Inspection of the Nose - no destructive lesion observed. Inspection of the nares - Left - quiet respiration. Right - quiet  respiration. Mouth and Throat Lips - Upper Lip - no fissures observed, no pallor noted. Lower Lip - no fissures observed, no pallor noted. Nasopharynx -  no discharge present. Oral Cavity/Oropharynx - Tongue - no dryness observed. Oral Mucosa - no cyanosis observed. Hypopharynx - no evidence of airway distress observed.  Chest and Lung Exam Inspection Movements - Normal and Symmetrical. Accessory muscles - No use of accessory muscles in breathing. Palpation Palpation of the chest reveals - Non-tender. Auscultation Breath sounds - Normal and Clear.  Cardiovascular Auscultation Rhythm - Regular. Murmurs & Other Heart Sounds - Auscultation of the heart reveals - No Murmurs and No Systolic Clicks.  Abdomen Inspection Inspection of the abdomen reveals - No Visible peristalsis and No Abnormal pulsations. Umbilicus - No Bleeding, No Urine drainage. Palpation/Percussion Palpation and Percussion of the abdomen reveal - Soft, Non Tender, No Rebound tenderness, No Rigidity (guarding) and No Cutaneous hyperesthesia. Note: Abdomen morbidly obese but soft. Right upper quadrant oblique incision consistent with prior open cholecystectomy. Clean under panniculus. No panniculitis. Nontender. Not distended. No umbilical or incisional hernias. No guarding.  Male Genitourinary Sexual Maturity Tanner 5 - Adult hair pattern and Adult penile size and shape. Note: No inguinal hernias. Normal external genitalia. Epididymi, testes, and spermatic cords normal without any masses.  Rectal Note: Please see anoscopy.  Persistent internal condylomas.   Peripheral Vascular Upper Extremity Inspection - Left - No Cyanotic nailbeds, Not Ischemic. Right - No Cyanotic nailbeds, Not Ischemic.  Neurologic Neurologic evaluation reveals -normal attention span and ability to concentrate, able to name objects and repeat phrases. Appropriate fund of knowledge , normal sensation and normal coordination.  Mental Status Affect - not angry, not paranoid. Cranial Nerves-Normal Bilaterally. Gait-Normal.  Neuropsychiatric Mental status exam performed with findings of-able to articulate well with normal speech/language, rate, volume and coherence, thought content normal with ability to perform basic computations and apply abstract reasoning and no evidence of hallucinations, delusions, obsessions or homicidal/suicidal ideation.  Musculoskeletal Global Assessment Spine, Ribs and Pelvis - no instability, subluxation or laxity. Right Upper Extremity - no instability, subluxation or laxity.  Lymphatic Head & Neck  General Head & Neck Lymphatics: Bilateral - Description - No Localized lymphadenopathy. Axillary  General Axillary Region: Bilateral - Description - No Localized lymphadenopathy. Femoral & Inguinal  Generalized Femoral & Inguinal Lymphatics: Left - Description - No Localized lymphadenopathy. Right - Description - No Localized lymphadenopathy.   Results James Hector MD; 08/17/2018 1:53 PM) Procedures  Name Value Date Hemorrhoids Procedure Other: 1 mm right lateral firm. Area revealed tags. Not certain if they are truly condyloma versus whiteheads............Marland KitchenOn the inside by digital and anoscopic exam and is a circumference of small warts at the white line of [place]. Right anterior especially left lateral. Less burden than last time but persistent.  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 I think he is failed nonoperative management with 2 cryoablations earlier this year. I believe this requires anorectal examination under anesthesia with laser ablation and possible excision. He's not HIV-positive nor has unprotected anal receptive sex, so lower risk for recurrence once we can more definitively  clear the region  Current Plans ANOSCOPY, DIAGNOSTIC [id number]) The anatomy & physiology of the anorectal region was discussed. The pathophysiology of anorectal warts and differential diagnosis was discussed. Natural history risks without surgery was discussed such as further growth and cancer. I stressed the importance of office follow-up to catch early recurrence & minimize/halt progression of disease. Interventions such as cauterization or cryotherapy by topical agents were  discussed.  The patient's symptoms are not adequately controlled by non-operative treatments. I feel the risks & problems of no surgery outweigh the operative risks; therefore, I recommended surgery to treat the anal warts by removal, ablation and/or cauterization.  Risks such as bleeding, infection, need for further treatment, heart attack, death, and other risks were discussed. I noted a good likelihood this will help address the problem. Goals of post-operative recovery were discussed as well. Possibility that this will not correct all symptoms was explained. Post-operative pain, bleeding, constipation, and other problems after surgery were discussed. We will work to minimize complications. Educational handouts further explaining the pathology, treatment options, and bowel regimen were given as well. Questions were answered. The patient expresses understanding & wishes to proceed with surgery.   ENCOUNTER FOR PREOPERATIVE EXAMINATION FOR GENERAL SURGICAL PROCEDURE (Z01.818)  Current Plans You are being scheduled for surgery- Our schedulers will call you.  You should hear from our office's scheduling department within 5 working days about the location, date, and time of surgery. We try to make accommodations for patient's preferences in scheduling surgery, but sometimes the OR schedule or the surgeon's schedule prevents [place] from making those accommodations.  If you have not heard from our office  [phone]) in 5 working days, call the office and ask for your surgeon's nurse.  If you have other questions about your diagnosis, plan, or surgery, call the office and ask for your surgeon's nurse.  Pt Education - [facility] Rectal Prep for Anorectal outpatient/office surgery: discussed with patient and provided information. Pt Education - [facility] Rectal Surgery HCI (): discussed with patient and provided information. Pt Education - [facility] Good Bowel Health () Pt Education - [facility] Anal Warts ()  James Hector, MD, FACS, MASCRS Gastrointestinal and Minimally Invasive Surgery    [address], [place], [place] [address] [phone] Main / Paging [phone] Fax

## 2018-12-20 ENCOUNTER — Ambulatory Visit (HOSPITAL_BASED_OUTPATIENT_CLINIC_OR_DEPARTMENT_OTHER)
Admission: RE | Admit: 2018-12-20 | Discharge: 2018-12-20 | Disposition: A | Discharge status: Home / Self Care | Payer: Medicare HMO | Attending: Surgery | Admitting: Surgery

## 2018-12-20 ENCOUNTER — Other Ambulatory Visit: Payer: Self-pay

## 2018-12-20 ENCOUNTER — Encounter (HOSPITAL_BASED_OUTPATIENT_CLINIC_OR_DEPARTMENT_OTHER): Payer: Self-pay | Admitting: Emergency Medicine

## 2018-12-20 ENCOUNTER — Ambulatory Visit (HOSPITAL_BASED_OUTPATIENT_CLINIC_OR_DEPARTMENT_OTHER): Payer: Medicare HMO | Admitting: Certified Registered"

## 2018-12-20 ENCOUNTER — Encounter (HOSPITAL_BASED_OUTPATIENT_CLINIC_OR_DEPARTMENT_OTHER)
Admission: RE | Disposition: A | Discharge status: Home / Self Care | Payer: Self-pay | Source: Home / Self Care | Attending: Surgery

## 2018-12-20 DIAGNOSIS — K641 Second degree hemorrhoids: Secondary | ICD-10-CM | POA: Diagnosis not present

## 2018-12-20 DIAGNOSIS — E119 Type 2 diabetes mellitus without complications: Secondary | ICD-10-CM | POA: Insufficient documentation

## 2018-12-20 DIAGNOSIS — A630 Anogenital (venereal) warts: Secondary | ICD-10-CM | POA: Insufficient documentation

## 2018-12-20 DIAGNOSIS — E785 Hyperlipidemia, unspecified: Secondary | ICD-10-CM | POA: Insufficient documentation

## 2018-12-20 DIAGNOSIS — G4733 Obstructive sleep apnea (adult) (pediatric): Secondary | ICD-10-CM | POA: Insufficient documentation

## 2018-12-20 DIAGNOSIS — D649 Anemia, unspecified: Secondary | ICD-10-CM | POA: Diagnosis not present

## 2018-12-20 DIAGNOSIS — Z794 Long term (current) use of insulin: Secondary | ICD-10-CM | POA: Diagnosis not present

## 2018-12-20 DIAGNOSIS — Z79899 Other long term (current) drug therapy: Secondary | ICD-10-CM | POA: Diagnosis not present

## 2018-12-20 DIAGNOSIS — R69 Illness, unspecified: Secondary | ICD-10-CM | POA: Diagnosis not present

## 2018-12-20 DIAGNOSIS — I10 Essential (primary) hypertension: Secondary | ICD-10-CM | POA: Insufficient documentation

## 2018-12-20 HISTORY — DX: Personal history of adenomatous and serrated colon polyps: Z86.0101

## 2018-12-20 HISTORY — PX: LASER ABLATION CONDOLAMATA: SHX5941

## 2018-12-20 HISTORY — DX: Anogenital (venereal) warts: A63.0

## 2018-12-20 HISTORY — DX: Personal history of other medical treatment: Z92.89

## 2018-12-20 HISTORY — DX: Obstructive sleep apnea (adult) (pediatric): G47.33

## 2018-12-20 HISTORY — DX: Personal history of colonic polyps: Z86.010

## 2018-12-20 HISTORY — DX: Presence of spectacles and contact lenses: Z97.3

## 2018-12-20 HISTORY — DX: Other seasonal allergic rhinitis: J30.2

## 2018-12-20 HISTORY — DX: Type 2 diabetes mellitus without complications: E11.9

## 2018-12-20 HISTORY — DX: Obstructive sleep apnea (adult) (pediatric): Z99.89

## 2018-12-20 LAB — GLUCOSE, CAPILLARY: Glucose-Capillary: 182 mg/dL — ABNORMAL HIGH (ref 70–99)

## 2018-12-20 LAB — POCT I-STAT, CHEM 8
BUN: 8 mg/dL (ref 8–23)
Calcium, Ion: 1.2 mmol/L (ref 1.15–1.40)
Chloride: 101 mmol/L (ref 98–111)
Creatinine, Ser: 0.7 mg/dL (ref 0.61–1.24)
Glucose, Bld: 152 mg/dL — ABNORMAL HIGH (ref 70–99)
HCT: 43 % (ref 39.0–52.0)
Hemoglobin: 14.6 g/dL (ref 13.0–17.0)
Potassium: 3.5 mmol/L (ref 3.5–5.1)
Sodium: 141 mmol/L (ref 135–145)
TCO2: 25 mmol/L (ref 22–32)

## 2018-12-20 SURGERY — ABLATION, CONDYLOMA, USING LASER
Anesthesia: General | Site: Anus

## 2018-12-20 MED ORDER — GABAPENTIN 300 MG PO CAPS
ORAL_CAPSULE | ORAL | Status: AC
  Filled 2018-12-20: qty 1

## 2018-12-20 MED ORDER — PROPOFOL 10 MG/ML IV BOLUS
INTRAVENOUS | Status: AC
  Filled 2018-12-20: qty 40

## 2018-12-20 MED ORDER — LIDOCAINE 2% (20 MG/ML) 5 ML SYRINGE
INTRAMUSCULAR | Status: DC | PRN
  Administered 2018-12-20: 60 mg via INTRAVENOUS

## 2018-12-20 MED ORDER — GABAPENTIN 300 MG PO CAPS
300.0000 mg | ORAL_CAPSULE | ORAL | Status: AC
  Administered 2018-12-20: 300 mg via ORAL
  Filled 2018-12-20: qty 1

## 2018-12-20 MED ORDER — FENTANYL CITRATE (PF) 100 MCG/2ML IJ SOLN
INTRAMUSCULAR | Status: AC
  Filled 2018-12-20: qty 2

## 2018-12-20 MED ORDER — ONDANSETRON HCL 4 MG/2ML IJ SOLN
INTRAMUSCULAR | Status: AC
  Filled 2018-12-20: qty 2

## 2018-12-20 MED ORDER — MIDAZOLAM HCL 2 MG/2ML IJ SOLN
INTRAMUSCULAR | Status: AC
  Filled 2018-12-20: qty 2

## 2018-12-20 MED ORDER — 0.9 % SODIUM CHLORIDE (POUR BTL) OPTIME
TOPICAL | Status: DC | PRN
  Administered 2018-12-20: 1000 mL

## 2018-12-20 MED ORDER — OXYCODONE HCL 5 MG PO TABS: 5.0000 mg | ORAL_TABLET | Freq: Four times a day (QID) | ORAL | 0 refills | Status: DC | PRN

## 2018-12-20 MED ORDER — GENTAMICIN SULFATE 40 MG/ML IJ SOLN
5.0000 mg/kg | INTRAVENOUS | Status: DC
  Filled 2018-12-20: qty 14

## 2018-12-20 MED ORDER — CHLORHEXIDINE GLUCONATE CLOTH 2 % EX PADS
6.0000 | MEDICATED_PAD | Freq: Once | CUTANEOUS | Status: DC
  Filled 2018-12-20: qty 6

## 2018-12-20 MED ORDER — FENTANYL CITRATE (PF) 100 MCG/2ML IJ SOLN
INTRAMUSCULAR | Status: DC | PRN
  Administered 2018-12-20 (×2): 50 ug via INTRAVENOUS

## 2018-12-20 MED ORDER — ONDANSETRON HCL 4 MG/2ML IJ SOLN
INTRAMUSCULAR | Status: DC | PRN
  Administered 2018-12-20: 4 mg via INTRAVENOUS

## 2018-12-20 MED ORDER — BUPIVACAINE LIPOSOME 1.3 % IJ SUSP
INTRAMUSCULAR | Status: AC
  Filled 2018-12-20: qty 20

## 2018-12-20 MED ORDER — SUGAMMADEX SODIUM 500 MG/5ML IV SOLN
INTRAVENOUS | Status: AC
  Filled 2018-12-20: qty 5

## 2018-12-20 MED ORDER — BUPIVACAINE-EPINEPHRINE 0.25% -1:200000 IJ SOLN
INTRAMUSCULAR | Status: DC | PRN
  Administered 2018-12-20: 20 mL

## 2018-12-20 MED ORDER — CLINDAMYCIN PHOSPHATE 900 MG/50ML IV SOLN
900.0000 mg | INTRAVENOUS | Status: DC
  Filled 2018-12-20: qty 50

## 2018-12-20 MED ORDER — ACETAMINOPHEN 500 MG PO TABS
1000.0000 mg | ORAL_TABLET | ORAL | Status: AC
  Administered 2018-12-20: 06:00:00 1000 mg via ORAL
  Filled 2018-12-20: qty 2

## 2018-12-20 MED ORDER — PROPOFOL 10 MG/ML IV BOLUS
INTRAVENOUS | Status: DC | PRN
  Administered 2018-12-20: 170 mg via INTRAVENOUS

## 2018-12-20 MED ORDER — GENTAMICIN SULFATE 40 MG/ML IJ SOLN
5.0000 mg/kg | INTRAVENOUS | Status: AC
  Administered 2018-12-20: 430 mg via INTRAVENOUS
  Filled 2018-12-20: qty 10.75

## 2018-12-20 MED ORDER — MIDAZOLAM HCL 2 MG/2ML IJ SOLN
INTRAMUSCULAR | Status: DC | PRN
  Administered 2018-12-20: 2 mg via INTRAVENOUS

## 2018-12-20 MED ORDER — FENTANYL CITRATE (PF) 100 MCG/2ML IJ SOLN
25.0000 ug | INTRAMUSCULAR | Status: DC | PRN
  Filled 2018-12-20: qty 1

## 2018-12-20 MED ORDER — LIDOCAINE 2% (20 MG/ML) 5 ML SYRINGE
INTRAMUSCULAR | Status: AC
  Filled 2018-12-20: qty 5

## 2018-12-20 MED ORDER — BUPIVACAINE LIPOSOME 1.3 % IJ SUSP
20.0000 mL | Freq: Once | INTRAMUSCULAR | Status: DC
  Filled 2018-12-20: qty 20

## 2018-12-20 MED ORDER — PHENYLEPHRINE 40 MCG/ML (10ML) SYRINGE FOR IV PUSH (FOR BLOOD PRESSURE SUPPORT)
PREFILLED_SYRINGE | INTRAVENOUS | Status: DC | PRN
  Administered 2018-12-20 (×2): 80 ug via INTRAVENOUS

## 2018-12-20 MED ORDER — DIBUCAINE (PERIANAL) 1 % EX OINT
TOPICAL_OINTMENT | CUTANEOUS | Status: AC
  Filled 2018-12-20: qty 28

## 2018-12-20 MED ORDER — ACETAMINOPHEN 500 MG PO TABS
ORAL_TABLET | ORAL | Status: AC
  Filled 2018-12-20: qty 2

## 2018-12-20 MED ORDER — PHENYLEPHRINE 40 MCG/ML (10ML) SYRINGE FOR IV PUSH (FOR BLOOD PRESSURE SUPPORT)
PREFILLED_SYRINGE | INTRAVENOUS | Status: AC
  Filled 2018-12-20: qty 10

## 2018-12-20 MED ORDER — ACETIC ACID 5 % SOLN
Status: AC
  Filled 2018-12-20: qty 500

## 2018-12-20 MED ORDER — SUGAMMADEX SODIUM 200 MG/2ML IV SOLN
INTRAVENOUS | Status: DC | PRN
  Administered 2018-12-20: 450 mg via INTRAVENOUS

## 2018-12-20 MED ORDER — BUPIVACAINE LIPOSOME 1.3 % IJ SUSP
INTRAMUSCULAR | Status: DC | PRN
  Administered 2018-12-20: 20 mL

## 2018-12-20 MED ORDER — ROCURONIUM BROMIDE 10 MG/ML (PF) SYRINGE
PREFILLED_SYRINGE | INTRAVENOUS | Status: AC
  Filled 2018-12-20: qty 10

## 2018-12-20 MED ORDER — ACETIC ACID 5 % SOLN
Status: DC | PRN
  Administered 2018-12-20: 1 via TOPICAL

## 2018-12-20 MED ORDER — SUCCINYLCHOLINE CHLORIDE 200 MG/10ML IV SOSY
PREFILLED_SYRINGE | INTRAVENOUS | Status: DC | PRN
  Administered 2018-12-20: 140 mg via INTRAVENOUS

## 2018-12-20 MED ORDER — LACTATED RINGERS IV SOLN
INTRAVENOUS | Status: DC
  Administered 2018-12-20: 08:00:00 via INTRAVENOUS
  Filled 2018-12-20: qty 1000

## 2018-12-20 MED ORDER — BUPIVACAINE-EPINEPHRINE 0.25% -1:200000 IJ SOLN
INTRAMUSCULAR | Status: AC
  Filled 2018-12-20: qty 1

## 2018-12-20 MED ORDER — SUCCINYLCHOLINE CHLORIDE 200 MG/10ML IV SOSY
PREFILLED_SYRINGE | INTRAVENOUS | Status: AC
  Filled 2018-12-20: qty 10

## 2018-12-20 MED ORDER — CLINDAMYCIN PHOSPHATE 900 MG/50ML IV SOLN
900.0000 mg | INTRAVENOUS | Status: AC
  Administered 2018-12-20: 08:00:00 900 mg via INTRAVENOUS
  Filled 2018-12-20: qty 50

## 2018-12-20 MED ORDER — WHITE PETROLATUM EX OINT
TOPICAL_OINTMENT | CUTANEOUS | Status: AC
  Filled 2018-12-20: qty 5

## 2018-12-20 MED ORDER — ROCURONIUM BROMIDE 10 MG/ML (PF) SYRINGE
PREFILLED_SYRINGE | INTRAVENOUS | Status: DC | PRN
  Administered 2018-12-20: 10 mg via INTRAVENOUS
  Administered 2018-12-20: 40 mg via INTRAVENOUS
  Administered 2018-12-20: 5 mg via INTRAVENOUS

## 2018-12-20 MED ORDER — CLINDAMYCIN PHOSPHATE 900 MG/50ML IV SOLN
INTRAVENOUS | Status: AC
  Filled 2018-12-20: qty 50

## 2018-12-20 SURGICAL SUPPLY — 42 items
BENZOIN TINCTURE PRP APPL 2/3 (GAUZE/BANDAGES/DRESSINGS) IMPLANT
BLADE HEX COATED 2.75 (ELECTRODE) ×2 IMPLANT
BLADE SURG 15 STRL LF DISP TIS (BLADE) ×1 IMPLANT
BLADE SURG 15 STRL SS (BLADE) ×1
BRIEF STRETCH FOR OB PAD LRG (UNDERPADS AND DIAPERS) ×2 IMPLANT
COVER BACK TABLE 60X90IN (DRAPES) IMPLANT
COVER MAYO STAND STRL (DRAPES) ×2 IMPLANT
COVER WAND RF STERILE (DRAPES) ×4 IMPLANT
DECANTER SPIKE VIAL GLASS SM (MISCELLANEOUS) IMPLANT
DRAPE LAPAROTOMY 100X72 PEDS (DRAPES) ×2 IMPLANT
DRAPE UTILITY XL STRL (DRAPES) ×2 IMPLANT
DRSG PAD ABDOMINAL 8X10 ST (GAUZE/BANDAGES/DRESSINGS) ×2 IMPLANT
ELECT NEEDLE TIP 2.8 STRL (NEEDLE) ×2 IMPLANT
ELECT REM PT RETURN 9FT ADLT (ELECTROSURGICAL) ×2
ELECTRODE REM PT RTRN 9FT ADLT (ELECTROSURGICAL) ×1 IMPLANT
GAUZE 4X4 16PLY RFD (DISPOSABLE) ×2 IMPLANT
GAUZE SPONGE 4X4 12PLY STRL (GAUZE/BANDAGES/DRESSINGS) ×2 IMPLANT
GLOVE ECLIPSE 8.0 STRL XLNG CF (GLOVE) ×2 IMPLANT
GLOVE INDICATOR 8.0 STRL GRN (GLOVE) ×2 IMPLANT
GOWN STRL REUS W/TWL XL LVL3 (GOWN DISPOSABLE) ×2 IMPLANT
IV CATH PLACEMENT 22GA SAFETY (IV SOLUTION) ×2 IMPLANT
KIT SIGMOIDOSCOPE (SET/KITS/TRAYS/PACK) IMPLANT
KIT TURNOVER CYSTO (KITS) ×2 IMPLANT
LOOP VESSEL MAXI BLUE (MISCELLANEOUS) IMPLANT
PACK BASIN DAY SURGERY FS (CUSTOM PROCEDURE TRAY) ×2 IMPLANT
PENCIL BUTTON HOLSTER BLD 10FT (ELECTRODE) ×2 IMPLANT
SPONGE LAP 4X18 RFD (DISPOSABLE) ×2 IMPLANT
SURGILUBE 2OZ TUBE FLIPTOP (MISCELLANEOUS) ×2 IMPLANT
SUT ETHIBOND 0 (SUTURE) IMPLANT
SUT VIC AB 2-0 UR6 27 (SUTURE) IMPLANT
SUT VIC AB 3-0 SH 18 (SUTURE) IMPLANT
SYR CONTROL 10ML LL (SYRINGE) IMPLANT
SYRINGE 20CC LL (MISCELLANEOUS) ×2 IMPLANT
TOWEL OR 17X26 10 PK STRL BLUE (TOWEL DISPOSABLE) ×2 IMPLANT
TRAY DSU PREP LF (CUSTOM PROCEDURE TRAY) ×2 IMPLANT
TUBE CONNECTING 12X1/4 (SUCTIONS) ×2 IMPLANT
TUBING STERILE (MISCELLANEOUS) ×2 IMPLANT
UNDERPAD 30X30 (UNDERPADS AND DIAPERS) ×2 IMPLANT
VACUUM HOSE 7/8X10 W/ WAND (MISCELLANEOUS) IMPLANT
VACUUM HOSE/TUBING 7/8INX6FT (MISCELLANEOUS) IMPLANT
WATER STERILE IRR 500ML POUR (IV SOLUTION) ×2 IMPLANT
YANKAUER SUCT BULB TIP NO VENT (SUCTIONS) ×2 IMPLANT

## 2018-12-20 NOTE — Anesthesia Preprocedure Evaluation (Signed)
Anesthesia Evaluation  Patient identified by MRN, date of birth, ID band Patient awake    Reviewed: Allergy & Precautions, NPO status , Patient's Chart, lab work & pertinent test results  Airway Mallampati: II  TM Distance: >3 FB     Dental   Pulmonary    breath sounds clear to auscultation       Cardiovascular hypertension,  Rhythm:Regular Rate:Normal     Neuro/Psych    GI/Hepatic negative GI ROS, Neg liver ROS,   Endo/Other  diabetes  Renal/GU negative Renal ROS     Musculoskeletal   Abdominal   Peds  Hematology   Anesthesia Other Findings   Reproductive/Obstetrics                             Anesthesia Physical Anesthesia Plan  ASA: III  Anesthesia Plan: General   Post-op Pain Management:    Induction: Intravenous  PONV Risk Score and Plan: Ondansetron, Dexamethasone and Midazolam  Airway Management Planned: LMA  Additional Equipment:   Intra-op Plan:   Post-operative Plan: Extubation in OR  Informed Consent: I have reviewed the patients History and Physical, chart, labs and discussed the procedure including the risks, benefits and alternatives for the proposed anesthesia with the patient or authorized representative who has indicated his/her understanding and acceptance.     Dental advisory given  Plan Discussed with: CRNA and Anesthesiologist  Anesthesia Plan Comments:         Anesthesia Quick Evaluation

## 2018-12-20 NOTE — Transfer of Care (Signed)
Immediate Anesthesia Transfer of Care Note  Patient: James Camacho  Procedure(s) Performed: Procedure(s) (LRB): LASER ABLATION OF CONDOLAMATA, EXAMINATION UNDER ANESTHESIA (N/A)  Patient Location: PACU  Anesthesia Type: General  Level of Consciousness: awake, oriented, sedated and patient cooperative  Airway & Oxygen Therapy: Patient Spontanous Breathing and Patient connected to face mask oxygen  Post-op Assessment: Report given to PACU RN and Post -op Vital signs reviewed and stable  Post vital signs: Reviewed and stable  Complications: No apparent anesthesia complications  Last Vitals:  Vitals Value Taken Time  BP 91/67 12/20/18 0904  Temp    Pulse 79 12/20/18 0907  Resp    SpO2 98 % 12/20/18 0907  Vitals shown include unvalidated device data.  Last Pain:  Vitals:   12/20/18 0628  TempSrc: Oral      Patients Stated Pain Goal: 6 (12/20/18 [id number])

## 2018-12-20 NOTE — Discharge Instructions (Signed)
ANORECTAL SURGERY:  POST OPERATIVE INSTRUCTIONS  ######################################################################  EAT Start with a pureed / full liquid diet After 24 hours, gradually transition to a high fiber diet.    CONTROL PAIN Control pain so you can tolerate bowel movements,  walk, sleep, tolerate sneezing/coughing, and go up/down stairs.   HAVE A BOWEL MOVEMENT DAILY Keep your bowels regular to avoid problems.   Taking a fiber supplement every day to keep bowels soft.   Try a laxative to override constipation. Use an antidairrheal to slow down diarrhea.   Call if not better after 2 tries  WALK Walk an hour a day.  Control your pain to do that.   CALL IF YOU HAVE PROBLEMS/CONCERNS Call if you are still struggling despite following these instructions. Call if you have concerns not answered by these instructions  ######################################################################    1. Take your usually prescribed home medications unless otherwise directed.  2. DIET: Follow a light bland diet & liquids the first 24 hours after arrival home, such as soup, liquids, starches, etc.  Be sure to drink plenty of fluids.  Quickly advance to a usual solid diet within a few days.  Avoid fast food or heavy meals as your are more likely to get nauseated or have irregular bowels.  A low-fat, high-fiber diet for the rest of your life is ideal.  3. PAIN CONTROL: a. Pain is best controlled by a usual combination of three different methods TOGETHER: i. Ice/Heat ii. Over the counter pain medication iii. Prescription pain medication b. Expect swelling and discomfort in the anus/rectal area.  Warm water baths (30-60 minutes up to 6 times a day, especially after bowel meovements) will help. Use ice for the first few days to help decrease swelling and bruising, then switch to heat such as warm towels, sitz baths, warm baths, etc to help relax tight/sore spots and speed recovery.   Some people prefer to use ice alone, heat alone, alternating between ice & heat.  Experiment to what works for you.   c. It is helpful to take an over-the-counter pain medication continuously for the first few weeks.  Choose one of the following that works best for you: i. Naproxen (Aleve, etc)  Two 235m tabs twice a day ii. Ibuprofen (Advil, etc) Three 2031mtabs four times a day (every meal & bedtime) iii. Acetaminophen (Tylenol, etc) 500-65083mour times a day (every meal & bedtime) d. A  prescription for pain medication (such as oxycodone, hydrocodone, etc) should be given to you upon discharge.  Take your pain medication as prescribed.  i. If you are having problems/concerns with the prescription medicine (does not control pain, nausea, vomiting, rash, itching, etc), please call us [phone] see if we need to switch you to a different pain medicine that will work better for you and/or control your side effect better. ii. If you need a refill on your pain medication, please contact your pharmacy.  They will contact our office to request authorization. Prescriptions will not be filled after 5 pm or on week-ends.  If can take up to 48 hours for it to be filled & ready so avoid waiting until you are down to thel ast pill. e. A topical cream (Dibucaine) or a prescription for a cream (such as diltiazem 2% gel) may be given to you.  Many people find relief with topical creams.  Some people find it burns too much.  Experiment.  If it helps, use it.  If it burns, don't using  it.  Use a Sitz Bath 4-8 times a day for relief   [employer] A sitz bath is a warm water bath taken in the sitting position that covers only the hips and buttocks. It may be used for either healing or hygiene purposes. Sitz baths are also used to relieve pain, itching, or muscle spasms. The water may contain medicine. Moist heat will help you heal and relax.  HOME CARE INSTRUCTIONS  Take 3 to 4 sitz baths a day. 1. Fill the  bathtub half full with warm water. 2. Sit in the water and open the drain a little. 3. Turn on the warm water to keep the tub half full. Keep the water running constantly. 4. Soak in the water for 15 to 20 minutes. 5. After the sitz bath, pat the affected area dry first.   4. KEEP YOUR BOWELS REGULAR a. The goal is one soft bowel movement a day b. Avoid getting constipated.  Between the surgery and the pain medications, it is common to experience some constipation.  Increasing fluid intake and taking a fiber supplement (such as Metamucil, Citrucel, FiberCon, MiraLax, etc) 2-3 times a day regularly will usually help prevent this problem from occurring.  A mild laxative (prune juice, Milk of Magnesia, MiraLax, etc) should be taken according to package directions if there are no bowel movements after 48 hours. c. Watch out for diarrhea.  If you have many loose bowel movements, simplify your diet to bland foods & liquids for a few days.  Stop any stool softeners and decrease your fiber supplement.  Switching to mild anti-diarrheal medications (Kayopectate, Pepto Bismol) can help.  Can try an imodium/loperamide dose.  If this worsens or does not improve, please call us.  5. Wound Care  a. Remove your bandages with your first bowel movement, usually the day after surgery.  You may have packing if you had an abscess.  Let any packing or gauze fall come out.   b. Wear an absorbent pad or soft cotton balls in your underwear as needed to catch any drainage and help keep the area  c. Keep the area clean and dry.  Bathe / shower every day.  Keep the area clean by showering / bathing over the incision / wound.   It is okay to soak an open wound to help wash it.  Consider using a squeeze bottle filled with warm water to gently wash the anal area.  Wet wipes or showers / gentle washing after bowel movements is often less traumatic than regular toilet paper. d. Dennis Bast will often notice bleeding with bowel movements.   This should slow down by the end of the first week of surgery.  Sitting on an ice pack can help. e. Expect some drainage.  This should slow down by the end of the first week of surgery, but you will have occasional bleeding or drainage up to a few months after surgery.  Wear an absorbent pad or soft cotton gauze in your underwear until the drainage stops.  6. ACTIVITIES as tolerated:   a. You may resume regular (light) daily activities beginning the next day--such as daily self-care, walking, climbing stairs--gradually increasing activities as tolerated.  If you can walk 30 minutes without difficulty, it is safe to try more intense activity such as jogging, treadmill, bicycling, low-impact aerobics, swimming, etc. b. Save the most intensive and strenuous activity for last such as sit-ups, heavy lifting, contact sports, etc  Refrain from any heavy lifting or straining  until you are off narcotics for pain control.   c. DO NOT PUSH THROUGH PAIN.  Let pain be your guide: If it hurts to do something, don't do it.  Pain is your body warning you to avoid that activity for another week until the pain goes down. d. You may drive when you are no longer taking prescription pain medication, you can comfortably sit for long periods of time, and you can safely maneuver your car and apply brakes. e. Dennis Bast may have sexual intercourse when it is comfortable.  7. FOLLOW UP in our office a. Please call [facility] at [phone] to set up an appointment to see your surgeon in the office for a follow-up appointment approximately 2-3 weeks after your surgery. b. Make sure that you call for this appointment the day you arrive home to ensure a convenient appointment time.  8. IF YOU HAVE DISABILITY OR FAMILY LEAVE FORMS, BRING THEM TO THE OFFICE FOR PROCESSING.  DO NOT GIVE THEM TO YOUR DOCTOR.        WHEN TO CALL us [phone]: 1. Poor pain control 2. Reactions / problems with new medications (rash/itching, nausea,  etc)  3. Fever over 101.5 F (38.5 C) 4. Inability to urinate 5. Nausea and/or vomiting 6. Worsening swelling or bruising 7. Continued bleeding from incision. 8. Increased pain, redness, or drainage from the incision  The clinic staff is available to answer your questions during regular business hours (8:30am-5pm).  Please dont hesitate to call and ask to speak to one of our nurses for clinical concerns.   A surgeon from [facility] is always on call at the hospitals   If you have a medical emergency, go to the nearest emergency room or call 911.    [facility], [address], [place], [place], [place]  [address] ? MAIN: [phone] ? TOLL FREE: [phone] ? FAX [phone] [email or web address]    Recommended surgeon followup physical exam schedule for condyloma/warts: -every 6 months until negative exam x2, then -every Year until negative exam x2, then -as needed thereafter   Genital Warts Genital warts are a sexually transmitted infection. They may appear as small bumps on the tissues of the genital area. CAUSES  Genital warts are caused by a virus called human papillomavirus (HPV). HPV is the most common sexually transmitted disease (STD) and infection of the sex organs. This infection is spread by having unprotected sex with an infected person. It can be spread by vaginal, anal, and oral sex. Many people do not know they are infected. They may be infected for years without problems. However, even if they do not have problems, they can unknowingly pass the infection to their sexual partners. SYMPTOMS   Itching and irritation in the genital area.Anal wart   Warts that bleed.  Painful sexual intercourse. DIAGNOSIS  Warts are usually recognized with the naked eye on the vagina, vulva, perineum, anus, and rectum. Certain tests can also diagnose genital warts, such as:  A Pap test.  A tissue sample (biopsy)  exam.  Colposcopy. A magnifying tool is used to examine the vagina and cervix. The HPV cells will change color when certain solutions are used. TREATMENT  Warts can be removed by:  Applying certain chemicals, such as cantharidin or podophyllin.  Liquid nitrogen freezing (cryotherapy).  Immunotherapy with candida or trichophyton injections.  Laser treatment.  Burning with an electrified probe (electrocautery).  Interferon injections.  Surgery. PREVENTION  HPV vaccination can help prevent  HPV infections that cause genital warts and that cause cancer of the cervix. It is recommended that the vaccination be given to people between the ages 50 to 23 years old. The vaccine might not work as well or might not work at all if you already have HPV. It should not be given to pregnant women. HOME CARE INSTRUCTIONS   It is important to follow your caregiver's instructions. The warts will not go away without treatment. Repeat treatments are often needed to get rid of warts. Even after it appears that the warts are gone, the normal tissue underneath often remains infected.  Do not try to treat genital warts with medicine used to treat hand warts. This type of medicine is strong and can burn the skin in the genital area, causing more damage.  Tell your past and current sexual partner(s) that you have genital warts. They may be infected also and need treatment.  Avoid sexual contact while being treated.  Do not touch or scratch the warts. The infection may spread to other parts of your body.  Women with genital warts should have a cervical cancer check (Pap test) at least once a year. This type of cancer is slow-growing and can be cured if found early. Chances of developing cervical cancer are increased with HPV.  Inform your obstetrician about your warts in the event of pregnancy. This virus can be passed to the baby's respiratory tract. Discuss this with your caregiver.  Use a condom during  sexual intercourse. Following treatment, the use of condoms will help prevent reinfection.  Ask your caregiver about using over-the-counter anti-itch creams. SEEK MEDICAL CARE IF:   Your treated skin becomes red, swollen, or painful.  You have a fever.  You feel generally ill.  You feel little lumps in and around your genital area.  You are bleeding or have painful sexual intercourse. MAKE SURE YOU:   Understand these instructions.  Will watch your condition.  Will get help right away if you are not doing well or get worse.  Information for Discharge Teaching: EXPAREL (bupivacaine liposome injectable suspension)   Your surgeon or anesthesiologist gave you EXPAREL(bupivacaine) to help control your pain after surgery.   EXPAREL is a local anesthetic that provides pain relief by numbing the tissue around the surgical site.  EXPAREL is designed to release pain medication over time and can control pain for up to 72 hours.  Depending on how you respond to EXPAREL, you may require less pain medication during your recovery.  Possible side effects:  Temporary loss of sensation or ability to move in the area where bupivacaine was injected.  Nausea, vomiting, constipation  Rarely, numbness and tingling in your mouth or lips, lightheadedness, or anxiety may occur.  Call your doctor right away if you think you may be experiencing any of these sensations, or if you have other questions regarding possible side effects.  Follow all other discharge instructions given to you by your surgeon or nurse. Eat a healthy diet and drink plenty of water or other fluids.  If you return to the hospital for any reason within 96 hours following the administration of EXPAREL, it is important for health care providers to know that you have received this anesthetic. A teal colored band has been placed on your arm with the date, time and amount of EXPAREL you have received in order to alert and inform your  health care providers. Please leave this armband in place for the full 96 hours following administration,  and then you may remove the band.  Post Anesthesia Home Care Instructions  Activity: Get plenty of rest for the remainder of the day. A responsible individual must stay with you for 24 hours following the procedure.  For the next 24 hours, DO NOT: -Drive a car -Paediatric nurse -Drink alcoholic beverages -Take any medication unless instructed by your physician -Make any legal decisions or sign important papers.  Meals: Start with liquid foods such as gelatin or soup. Progress to regular foods as tolerated. Avoid greasy, spicy, heavy foods. If nausea and/or vomiting occur, drink only clear liquids until the nausea and/or vomiting subsides. Call your physician if vomiting continues.  Special Instructions/Symptoms: Your throat may feel dry or sore from the anesthesia or the breathing tube placed in your throat during surgery. If this causes discomfort, gargle with warm salt water. The discomfort should disappear within 24 hours.

## 2018-12-20 NOTE — Anesthesia Postprocedure Evaluation (Signed)
Anesthesia Post Note  Patient: James Camacho  Procedure(s) Performed: LASER ABLATION OF CONDOLAMATA, EXAMINATION UNDER ANESTHESIA (N/A Anus)     Patient location during evaluation: PACU Anesthesia Type: General Level of consciousness: awake Pain management: pain level controlled Vital Signs Assessment: post-procedure vital signs reviewed and stable Respiratory status: spontaneous breathing Cardiovascular status: stable Postop Assessment: no apparent nausea or vomiting Anesthetic complications: no    Last Vitals:  Vitals:   12/20/18 1030 12/20/18 1140  BP: 124/69 121/78  Pulse: 71 74  Resp: 10 14  Temp: 36.5 C 36.6 C  SpO2: 96% 95%    Last Pain:  Vitals:   12/20/18 1145  TempSrc:   PainSc: 4                   

## 2018-12-20 NOTE — Interval H&P Note (Signed)
History and Physical Interval Note:  12/20/2018 7:29 AM  James Camacho  has presented today for surgery, with the diagnosis of Condyloma.  The various methods of treatment have been discussed with the patient and family. After consideration of risks, benefits and other options for treatment, the patient has consented to  Procedure(s): [place] (N/A) as a surgical intervention.  The patient's history has been reviewed, patient examined, no change in status, stable for surgery.  I have reviewed the patient's chart and labs.  Questions were answered to the patient's satisfaction.    I have re-reviewed the the patient's records, history, medications, and allergies.  I have re-examined the patient.  I again discussed intraoperative plans and goals of post-operative recovery.  The patient agrees to proceed.  James Camacho  June 22, 1950 [id number]  Patient Care Team: Venia Carbon, MD as PCP - General  Patient Active Problem List   Diagnosis Date Noted  . Urinary urgency 05/28/2018  . Exposure to sexually transmitted disease (STD) 03/02/2018  . Advance directive discussed with patient 06/20/2017  . Diabetes mellitus type 2, uncontrolled ([place]) 01/04/2017  . Allergic reaction to shellfish 07/25/2014  . Severe obesity (BMI 35.0-39.9) with comorbidity ([place]) 07/12/2011  . Routine general medical examination at a health care facility 01/11/2011  . Actinic keratosis 01/11/2011  . Sleep disturbance 07/06/2010  . Essential hypertension, benign 07/06/2007  . ERECTILE DYSFUNCTION, 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    Past Medical History:  Diagnosis Date  . Anal condylomata   . Anxiety   . Depression   . ED (erectile dysfunction)   . History of adenomatous polyp of colon   . History of exercise stress test    ETT 04-17-2007 (in epic)  negative  . Hyperlipidemia    LDL in 120's  . Hypertension    followed by pcp  . OSA on CPAP    study in epic  08-11-2017 moderate osa  . Seasonal allergic rhinitis   . Type 2 diabetes mellitus ([place])    followed by pcp  . Wears glasses   . Wears hearing aid in both ears     Past Surgical History:  Procedure Laterality Date  . CATARACT EXTRACTION W/PHACO Right 06/28/2017   Procedure: CATARACT EXTRACTION PHACO AND INTRAOCULAR LENS PLACEMENT ([place]) RIGHT;  Surgeon: Leandrew Koyanagi, MD;  Location: [facility];  Service: Ophthalmology;  Laterality: Right;  . CATARACT EXTRACTION W/PHACO Left 07/19/2017   Procedure: CATARACT EXTRACTION PHACO AND INTRAOCULAR LENS PLACEMENT ([place]) LEFT TOPICAL;  Surgeon: Leandrew Koyanagi, MD;  Location: [facility];  Service: Ophthalmology;  Laterality: Left;  IVA TOPICALLEFTDIABETIC  . CHOLECYSTECTOMY OPEN  1980s   dr  Sharlet Salina   AND APPENDECTOMY  . COLONOSCOPY WITH PROPOFOL  last one 03-28-2018   dr pyrtle    Social History   Socioeconomic History  . Marital status: Married    Spouse name: Not on file  . Number of children: 3  . Years of education: Not on file  . Highest education level: Not on file  Occupational History  . 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 Occupation: Engineer, site business    Comment:    Social Needs  . Financial resource strain: Not on file  . Food insecurity    Worry: Not on file    Inability: Not on file  . Transportation needs    Medical: Not on file    Non-medical: Not on file  Tobacco Use  . Smoking status: Never Smoker  . Smokeless tobacco: Never Used  Substance and Sexual Activity  . Alcohol use: Not Currently    Comment: rare  . Drug use: Never  . Sexual activity: Not on file  Lifestyle  . Physical activity    Days per week: Not on file    Minutes per session: Not on file  . Stress: Not on file  Relationships  . Social Herbalist on phone: Not on file    Gets together: Not on file    Attends religious service: Not on  file    Active member of club or organization: Not on file    Attends meetings of clubs or organizations: Not on file    Relationship status: Not on file  . Intimate partner violence    Fear of current or ex partner: Not on file    Emotionally abused: Not on file    Physically abused: Not on file    Forced sexual activity: Not on file  Other Topics Concern  . Not on file  Social History Narrative   Has living will   Wife is health care POA--- children are alternates   Would accept resuscitation   No prolonged tube feeds    Family History  Problem Relation Age of Onset  . Diabetes Mother   . Arthritis Mother   . Hypertension Mother   . Chronic Renal Failure Mother   . Parkinsonism Father   . Heart disease Father   . Healthy Sister   . Healthy Brother   . Healthy Sister   . Cancer Neg Hx        no colon or prostate cancer  . Colon cancer Neg Hx   . Esophageal cancer Neg Hx   . Stomach cancer Neg Hx   . Rectal cancer Neg Hx     Medications Prior to Admission  Medication Sig Dispense Refill Last Dose  . amLODipine (NORVASC) 10 MG tablet TAKE 1 TABLET BY MOUTH EVERY DAY (Patient taking differently: Take 10 mg by mouth daily. ) 90 tablet 3 12/20/2018 at 0430  . atorvastatin (LIPITOR) 10 MG tablet TAKE 1 TABLET BY MOUTH EVERY DAY (Patient taking differently: Take 10 mg by mouth daily. ) 90 tablet 0 12/20/2018 at 0430  . carvedilol (COREG) 12.5 MG tablet TAKE 1 TABLET (12.5 MG TOTAL) BY MOUTH 2 (TWO) TIMES DAILY WITH A MEAL. (Patient taking differently: Take 12.5 mg by mouth 2 (two) times daily with a meal. ) 180 tablet 3 12/20/2018 at 0430  . clobetasol ointment (TEMOVATE) 0.05 % APPLY TOPICALLY 2 (TWO) TIMES DAILY AS NEEDED. 30 g 1 Past Week at Unknown time  . fluticasone (FLONASE) 50 MCG/ACT nasal spray Place 2 sprays into both nostrils daily. (Patient taking differently: Place 2 sprays into both nostrils as needed. ) 16 g 11 Past Month at Unknown time  . furosemide (LASIX) 40  MG tablet Take 1 tablet (40 mg total) by mouth daily. (Patient taking differently: Take 40 mg by mouth daily. ) 90 tablet 0 12/19/2018 at Unknown time  . liraglutide (VICTOZA) 18 MG/3ML SOPN Inject 1.8 mg into the skin.   12/19/2018 at Unknown time  . lisinopril (ZESTRIL) 40 MG tablet TAKE 1 TABLET BY MOUTH EVERY DAY (Patient taking differently: Take 40 mg by mouth daily. ) 90 tablet 3 12/20/2018 at 0430  . metFORMIN (GLUCOPHAGE-XR) 500 MG 24 hr tablet TAKE 1 TABLET BY MOUTH EVERY DAY WITH BREAKFAST (Patient  taking differently: Take 500 mg by mouth daily with breakfast. ) 90 tablet 3   . sildenafil (REVATIO) 20 MG tablet TAKE 3 TO 5 TABLETS BY MOUTH EVERY DAY AS DIRECTED (Patient taking differently: as needed. ) 50 tablet 11   . glipiZIDE (GLUCOTROL) 5 MG tablet Take 1 tablet (5 mg total) by mouth 2 (two) times daily before a meal. (Patient taking differently: Take 5 mg by mouth 2 (two) times daily before a meal. ) 60 tablet 11 More than a month at Unknown time  . Insulin Pen Needle (PEN NEEDLES) 32G X 6 MM MISC 1 each by Does not apply route daily. Use with Victoza once a day 50 each 11     Current Facility-Administered Medications  Medication Dose Route Frequency Provider Last Rate Last Dose  . bupivacaine liposome (EXPAREL) 1.3 % injection 266 mg  20 mL Infiltration Once Michael Boston, MD      . Chlorhexidine Gluconate Cloth 2 % PADS 6 each  6 each Topical Once Michael Boston, MD       And  . Chlorhexidine Gluconate Cloth 2 % PADS 6 each  6 each Topical Once Michael Boston, MD      . clindamycin (CLEOCIN) IVPB 900 mg  900 mg Intravenous On Call to OR Michael Boston, MD       And  . gentamicin (GARAMYCIN) 430 mg in dextrose 5 % 100 mL IVPB  5 mg/kg (Adjusted) Intravenous On Call to OR Michael Boston, MD      . lactated ringers infusion   Intravenous Continuous Belinda Block, MD         Allergies  Allergen Reactions  . Aleve [Naproxen Sodium] Hives  . Other     Pt can only take Tylenol for  pain- anything prescribed that he has tried breaks him out in a rash.   . Penicillins Rash  . Shellfish Allergy Hives    BP 128/78   Pulse 74   Temp 97.6 F (36.4 C) (Oral)   Resp 16   Ht 5\' 8"  (1.727 m)   Wt 112.3 kg   SpO2 99%   BMI 37.64 kg/m   Labs: Results for orders placed or performed during the hospital encounter of 12/20/18 (from the past 48 hour(s))  I-STAT, chem 8     Status: Abnormal   Collection Time: 12/20/18  6:11 AM  Result Value Ref Range   Sodium 141 135 - 145 mmol/L   Potassium 3.5 3.5 - 5.1 mmol/L   Chloride 101 98 - 111 mmol/L   BUN 8 8 - 23 mg/dL   Creatinine, Ser 0.70 0.61 - 1.24 mg/dL   Glucose, Bld 152 (H) 70 - 99 mg/dL   Calcium, Ion 1.20 1.15 - 1.40 mmol/L   TCO2 25 22 - 32 mmol/L   Hemoglobin 14.6 13.0 - 17.0 g/dL   HCT 43.0 39.0 - 52.0 %    Imaging / Studies: No results found.   Adin Hector, M.D., F.A.C.S. Gastrointestinal and Minimally Invasive Surgery [facility] [address] [place], [place] [address] [phone] Main / Paging  12/20/2018 7:29 AM    Adin Hector

## 2018-12-20 NOTE — Op Note (Signed)
12/20/2018  8:45 AM  PATIENT:  James Camacho  68 y.o. 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  Delay start of Pharmacological VTE agent (>24hrs) due to surgical blood loss or risk of bleeding:  no  DRAINS: none   SPECIMEN:  No Specimen  DISPOSITION OF SPECIMEN:  N/A  COUNTS:  YES  PLAN OF CARE: Discharge to home after PACU  PATIENT DISPOSITION:  PACU - hemodynamically stable.  INDICATION: Patient with perianal condyloma.  Burden felt too much to treat in office.  Recommendation made for examination under anesthesia with ablation and possible removal of condyloma.    The anatomy & physiology of the anorectal region was discussed.  The pathophysiology of anorectal warts and differential diagnosis was discussed.  Natural history risks without surgery was discussed such as further growth and cancer.   I stressed the importance of office follow-up to catch early recurrence & minimize/halt progression of disease.  Interventions such as cauterization by topical agents were discussed.  The patient's symptoms are not adequately controlled by non-operative treatments.  I feel the risks & problems of no surgery outweigh the operative risks; therefore, I recommended surgery to treat the anal warts by removal, ablation and/or cauterization.  Risks such as bleeding,  infection, need for further treatment, heart attack, death, and other risks were discussed.   I noted a good likelihood this will help address the problem. Goals of post-operative recovery were discussed as well.  Possibility that this will not correct all symptoms was explained.  Post-operative pain, bleeding, constipation, and other problems after surgery were discussed.  We will work to minimize complications.   Educational handouts further explaining the pathology, treatment options, and bowel regimen were given as well.  Questions were answered.  The patient expresses understanding & wishes to proceed with surgery.  OR FINDINGS: Moderate carpeting of circumferential anal canal and hemorrhoidal piles with condylomata.  Also anal crypts.  Perianal pedunculated condylomata as well.  External lesions ablated with laser.  Internal lesions cauterized with needle tip cautery.  Grade 2 internal hemorrhoids with inflammation and bleeding.  Treated with using needle tip cauterization/ablation  DESCRIPTION:   Informed consent was confirmed. Patient underwent general anesthesia without difficulty. Patient was placed into prone positioning.  The perianal region was prepped and draped in sterile fashion. Surgical time-out confirmed our plan.  I did digital rectal examination and then transitioned over to anoscopy to get a sense of the anatomy.  Findings noted above. I proceeded to ablate the condyloma using the CO2 laser.  Set to 29 W initially.  I worked peripherally and then into anal canal.  He had moderately enlarged internal grade 2 hemorrhoids.  Because of some of the bleeding associate with hemorrhoids I transition to a needle tip cautery to help cauterize the anal crypt condylomata as well as placed the needle tip probe within the left lateral and right anterior internal hemorrhoid piles to help internally ablate and  cauterized hemorrhoid piles and help flatten them down.  Used acetic acid coating to help  better delineate atypical tissue and focused on cauterization of the remaining condylomata, sparing anal canal and perianal tissues.   I  repeated inspection.  No other major abnormalities noted.  I repeated anoscopy and examination.  Hemostasis was good.  Patient being extubated to go to the recovery room.  I had discussed postop care in detail with the patient in the preop holding area.  Instructions for post-operative recovery had been given in the office and moral be given at discharge.  Prescription for pain medicine written. I made an attempt to locate family to discuss patient's status and recommendations.  No one is available at this time.  I will try again later  Adin Hector, M.D., F.A.C.S. Gastrointestinal and Minimally Invasive Surgery [facility] [address] [place], [place] [address] [phone] Main / Paging

## 2018-12-20 NOTE — Anesthesia Procedure Notes (Signed)

## 2018-12-21 ENCOUNTER — Encounter (HOSPITAL_BASED_OUTPATIENT_CLINIC_OR_DEPARTMENT_OTHER): Payer: Self-pay | Admitting: Surgery

## 2018-12-24 ENCOUNTER — Other Ambulatory Visit: Payer: Self-pay | Admitting: Internal Medicine

## 2019-01-09 DIAGNOSIS — L821 Other seborrheic keratosis: Secondary | ICD-10-CM | POA: Diagnosis not present

## 2019-01-22 DIAGNOSIS — G4733 Obstructive sleep apnea (adult) (pediatric): Secondary | ICD-10-CM | POA: Diagnosis not present

## 2019-02-20 ENCOUNTER — Encounter: Payer: Self-pay | Admitting: Internal Medicine

## 2019-02-20 ENCOUNTER — Other Ambulatory Visit: Payer: Self-pay

## 2019-02-20 ENCOUNTER — Ambulatory Visit (INDEPENDENT_AMBULATORY_CARE_PROVIDER_SITE_OTHER): Payer: Medicare HMO | Admitting: Internal Medicine

## 2019-02-20 VITALS — BP 114/80 | HR 83 | Temp 98.0°F | Ht 68.0 in | Wt 250.0 lb

## 2019-02-20 DIAGNOSIS — Z125 Encounter for screening for malignant neoplasm of prostate: Secondary | ICD-10-CM

## 2019-02-20 DIAGNOSIS — Z7189 Other specified counseling: Secondary | ICD-10-CM | POA: Diagnosis not present

## 2019-02-20 DIAGNOSIS — K219 Gastro-esophageal reflux disease without esophagitis: Secondary | ICD-10-CM | POA: Diagnosis not present

## 2019-02-20 DIAGNOSIS — I10 Essential (primary) hypertension: Secondary | ICD-10-CM | POA: Diagnosis not present

## 2019-02-20 DIAGNOSIS — Z0000 Encounter for general adult medical examination without abnormal findings: Secondary | ICD-10-CM

## 2019-02-20 DIAGNOSIS — Z23 Encounter for immunization: Secondary | ICD-10-CM | POA: Diagnosis not present

## 2019-02-20 DIAGNOSIS — G4733 Obstructive sleep apnea (adult) (pediatric): Secondary | ICD-10-CM

## 2019-02-20 DIAGNOSIS — E1159 Type 2 diabetes mellitus with other circulatory complications: Secondary | ICD-10-CM | POA: Diagnosis not present

## 2019-02-20 LAB — PSA, MEDICARE: PSA: 1.16 ng/ml (ref 0.10–4.00)

## 2019-02-20 LAB — COMPREHENSIVE METABOLIC PANEL
ALT: 42 U/L (ref 0–53)
AST: 28 U/L (ref 0–37)
Albumin: 4.3 g/dL (ref 3.5–5.2)
Alkaline Phosphatase: 62 U/L (ref 39–117)
BUN: 8 mg/dL (ref 6–23)
CO2: 32 mEq/L (ref 19–32)
Calcium: 9.3 mg/dL (ref 8.4–10.5)
Chloride: 99 mEq/L (ref 96–112)
Creatinine, Ser: 0.76 mg/dL (ref 0.40–1.50)
GFR: 102.38 mL/min (ref >60.00)
Glucose, Bld: 163 mg/dL — ABNORMAL HIGH (ref 70–99)
Potassium: 4.1 mEq/L (ref 3.5–5.1)
Sodium: 136 mEq/L (ref 135–145)
Total Bilirubin: 1 mg/dL (ref 0.2–1.2)
Total Protein: 7.3 g/dL (ref 6.0–8.3)

## 2019-02-20 LAB — CBC
HCT: 46.2 % (ref 39.0–52.0)
Hemoglobin: 15.4 g/dL (ref 13.0–17.0)
MCHC: 33.4 g/dL (ref 30.0–36.0)
MCV: 92.7 fl (ref 78.0–100.0)
Platelets: 209 10*3/uL (ref 150.0–400.0)
RBC: 4.98 Mil/uL (ref 4.22–5.81)
RDW: 13.4 % (ref 11.5–15.5)
WBC: 8.4 10*3/uL (ref 4.0–10.5)

## 2019-02-20 LAB — LIPID PANEL
Cholesterol: 121 mg/dL (ref 0–200)
HDL: 38.8 mg/dL — ABNORMAL LOW (ref >39.00)
LDL Cholesterol: 61 mg/dL (ref 0–99)
NonHDL: 81.88
Total CHOL/HDL Ratio: 3
Triglycerides: 102 mg/dL (ref 0.0–149.0)
VLDL: 20.4 mg/dL (ref 0.0–40.0)

## 2019-02-20 LAB — HEMOGLOBIN A1C: Hgb A1c MFr Bld: 8.9 % — ABNORMAL HIGH (ref 4.6–6.5)

## 2019-02-20 LAB — HM DIABETES FOOT EXAM

## 2019-02-20 MED ORDER — OMEPRAZOLE 20 MG PO CPDR: 20.0000 mg | DELAYED_RELEASE_CAPSULE | Freq: Every day | ORAL | 3 refills | Status: DC

## 2019-02-20 NOTE — Assessment & Plan Note (Signed)
BP Readings from Last 3 Encounters:  02/20/19 114/80  12/20/18 121/78  08/29/18 110/70   Good control

## 2019-02-20 NOTE — Assessment & Plan Note (Signed)
Uses CPAP every night 

## 2019-02-20 NOTE — Assessment & Plan Note (Signed)
I have personally reviewed the Medicare Annual Wellness questionnaire and have noted 1. The patient's medical and social history 2. Their use of alcohol, tobacco or illicit drugs 3. Their current medications and supplements 4. The patient's functional ability including ADL's, fall risks, home safety risks and hearing or visual             impairment. 5. Diet and physical activities 6. Evidence for depression or mood disorders  The patients weight, height, BMI and visual acuity have been recorded in the chart I have made referrals, counseling and provided education to the patient based review of the above and I have provided the pt with a written personalized care plan for preventive services.  I have provided you with a copy of your personalized plan for preventive services. Please take the time to review along with your updated medication list.  Discussed fitness Td if any injury Consider shingrix (money may be an issue) Colon due 2025 Discussed PSA---will check Flu vaccine and pnuemovax today

## 2019-02-20 NOTE — Addendum Note (Signed)
Addended by: Pilar Grammes on: 02/20/2019 01:13 PM   Modules accepted: Orders

## 2019-02-20 NOTE — Assessment & Plan Note (Signed)
Seems to have acceptable control I don't think he has side effects from the liraglutide (just out of shape) HTN and vascular disease

## 2019-02-20 NOTE — Progress Notes (Signed)
Hearing Screening   125Hz  250Hz  500Hz  1000Hz  2000Hz  3000Hz  4000Hz  6000Hz  8000Hz   Right ear:           Left ear:           Comments: Has hearing aids. Not wearing them today.  Vision Screening Comments: August 2020

## 2019-02-20 NOTE — Assessment & Plan Note (Signed)
Persistent symptoms Will give Rx for omeprazole

## 2019-02-20 NOTE — Progress Notes (Signed)
Subjective:    Patient ID: James Camacho, male    DOB: 10/18/1950, 68 y.o.   MRN: [id number]  HPI Here for Medicare wellness visit and follow up of chronic health conditions  This visit occurred during the SARS-CoV-2 public health emergency.  Safety protocols were in place, including screening questions prior to the visit, additional usage of staff PPE, and extensive cleaning of exam room while observing appropriate contact time as indicated for disinfecting solutions.   Reviewed advanced directives  Reviewed other doctors-- Dr Sharee Pimple, Dr Areatha Keas, Dr Nehemiah Massed, Dr Beshel--chiropractor Trying to walk three times a week---3/4 mile. 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 amLODipine (NORVASC) 10 MG tablet TAKE 1 TABLET BY MOUTH EVERY DAY (Patient taking differently: Take 10 mg by mouth daily. ) 90 tablet 3  . atorvastatin (LIPITOR) 10 MG tablet TAKE 1 TABLET BY MOUTH EVERY DAY 90 tablet 3  . carvedilol (COREG) 12.5 MG tablet TAKE 1 TABLET (12.5 MG TOTAL) BY MOUTH 2 (TWO) TIMES DAILY WITH A MEAL. (Patient taking differently: Take 12.5 mg by mouth 2 (two) times daily with a meal. ) 180 tablet 3  . fluticasone (FLONASE) 50 MCG/ACT nasal spray Place 2 sprays into both nostrils daily. (Patient taking  differently: Place 2 sprays into both nostrils as needed. ) 16 g 11  . furosemide (LASIX) 40 MG tablet Take 1 tablet (40 mg total) by mouth daily. (Patient taking differently: Take 40 mg by mouth daily. ) 90 tablet 0  . Insulin Pen Needle (PEN NEEDLES) 32G X 6 MM MISC 1 each by Does not apply route daily. Use with Victoza once a day 50 each 11  . liraglutide (VICTOZA) 18 MG/3ML SOPN Inject 1.8 mg into the skin.    Marland Kitchen lisinopril (ZESTRIL) 40 MG tablet TAKE 1 TABLET BY MOUTH EVERY DAY (Patient taking differently: Take 40 mg by mouth daily. ) 90 tablet 3  . metFORMIN (GLUCOPHAGE-XR) 500 MG 24 hr tablet TAKE 1 TABLET BY MOUTH EVERY DAY WITH BREAKFAST (Patient taking differently: Take 500 mg by mouth daily with breakfast. ) 90 tablet 3  . oxyCODONE (OXY IR/ROXICODONE) 5 MG immediate release tablet Take 1-2 tablets (5-10 mg total) by mouth every 6 (six) hours as needed for moderate pain, severe pain or breakthrough pain. 30 tablet 0  . sildenafil (REVATIO) 20 MG tablet TAKE 3 TO 5 TABLETS BY MOUTH EVERY DAY AS DIRECTED (Patient taking differently: as needed. ) 50 tablet 11  . clobetasol ointment (TEMOVATE) 0.05 % APPLY TOPICALLY 2 (TWO) TIMES DAILY AS NEEDED. (Patient not taking: Reported on 02/20/2019) 30 g 1  . glipiZIDE (GLUCOTROL) 5 MG tablet Take 1 tablet (5 mg total) by mouth 2 (two) times daily before a meal. (Patient not taking: Reported on 02/20/2019) 60 tablet 11   No current facility-administered medications on file prior to visit.    Allergies  Allergen Reactions  .  Aleve [Naproxen Sodium] Hives  . Other     Pt can only take Tylenol for pain- anything prescribed that he has tried breaks him out in a rash.   . Penicillins Rash  . Shellfish Allergy Hives    Past Medical History:  Diagnosis Date  . Anal condylomata   . Anxiety   . Depression   . ED (erectile dysfunction)   . History of adenomatous polyp of colon   . History of exercise stress test    ETT 04-17-2007 (in epic)   negative  . Hyperlipidemia    LDL in 120's  . Hypertension    followed by pcp  . OSA (obstructive sleep apnea)   . OSA on CPAP    study in epic 08-11-2017 moderate osa  . Seasonal allergic rhinitis   . Type 2 diabetes mellitus ([place])    followed by pcp  . Wears glasses   . Wears hearing aid in both ears     Past Surgical History:  Procedure Laterality Date  . CATARACT EXTRACTION W/PHACO Right 06/28/2017   Procedure: CATARACT EXTRACTION PHACO AND INTRAOCULAR LENS PLACEMENT ([place]) RIGHT;  Surgeon: Leandrew Koyanagi, MD;  Location: [facility];  Service: Ophthalmology;  Laterality: Right;  . CATARACT EXTRACTION W/PHACO Left 07/19/2017   Procedure: CATARACT EXTRACTION PHACO AND INTRAOCULAR LENS PLACEMENT ([place]) LEFT TOPICAL;  Surgeon: Leandrew Koyanagi, MD;  Location: [facility];  Service: Ophthalmology;  Laterality: Left;  IVA TOPICALLEFTDIABETIC  . CHOLECYSTECTOMY OPEN  1980s   dr  Sharlet Salina   AND APPENDECTOMY  . COLONOSCOPY WITH PROPOFOL  last one 03-28-2018   dr pyrtle  . LASER ABLATION CONDOLAMATA N/A 12/20/2018   Procedure: LASER ABLATION OF CONDOLAMATA, EXAMINATION UNDER ANESTHESIA;  Surgeon: Michael Boston, MD;  Location: [facility];  Service: General;  Laterality: N/A;    Family History  Problem Relation Age of Onset  . Diabetes Mother   . Arthritis Mother   . Hypertension Mother   . Chronic Renal Failure Mother   . Parkinsonism Father   . Heart disease Father   . Healthy Sister   . Healthy Brother   . Healthy Sister   . Cancer Neg Hx        no colon or prostate cancer  . Colon cancer Neg Hx   . Esophageal cancer Neg Hx   . Stomach cancer Neg Hx   . Rectal cancer Neg Hx     Social History   Socioeconomic History  . Marital status: Married    Spouse name: Not on file  . Number of children: 3  . Years of education: Not on file  . Highest education level: Not on file  Occupational History  . 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 Occupation: Engineer, site business    Comment:    Tobacco Use  . Smoking status: Never Smoker  . Smokeless tobacco: Never Used  Substance and Sexual Activity  . Alcohol use: Not Currently    Comment: rare  . Drug use: Never  . Sexual activity: Not on file  Other Topics Concern  . Not on file  Social History Narrative   Has living will   Wife is health care POA--- children are alternates   Would accept resuscitation   No prolonged tube feeds   Social Determinants of Health   Financial Resource Strain:   . Difficulty of Paying Living Expenses: Not on file  Food Insecurity:   .  Worried About Charity fundraiser in the Last Year: Not on file  . Ran Out of Food in the Last Year: Not on file  Transportation Needs:   . Lack of Transportation (Medical): Not on file  . Lack of Transportation (Non-Medical): Not on file  Physical Activity:   . Days of Exercise per Week: Not on file  . Minutes of Exercise per Session: Not on file  Stress:   . Feeling of Stress : Not on file  Social Connections:   . Frequency of Communication with Friends and Family: Not on file  . Frequency of Social Gatherings with Friends and Family: Not on file  . Attends Religious Services: Not on file  . Active Member of Clubs or Organizations: Not on file  . Attends Archivist Meetings: Not on file  . Marital Status: Not on file  Intimate Partner Violence:   . Fear of Current or Ex-Partner: Not on file  . Emotionally Abused: Not on file  . Physically Abused: Not on file  . Sexually Abused: Not on file   Review of Systems Appetite fine Weight up some Teeth okay--regular with dentist Recent derm visit for spots---not cancer Sleeps well with CPAP--uses every night Wears seat belt Some heartburn--not controlled by tums. Wife's dexilant helped Bowels are fine--no blood No other sig back or joint pain    Objective:   Physical  Exam  Constitutional: He is oriented to person, place, and time. He appears well-developed. No distress.  HENT:  Mouth/Throat: Oropharynx is clear and moist. No oropharyngeal exudate.  Neck: No thyromegaly present.  Cardiovascular: Normal rate, regular rhythm, normal heart sounds and intact distal pulses. Exam reveals no gallop.  No murmur heard. Respiratory: Effort normal and breath sounds normal. No respiratory distress. He has no wheezes. He has no rales.  GI: Soft. There is no abdominal tenderness.  Musculoskeletal:        General: No tenderness or edema.  Lymphadenopathy:    He has no cervical adenopathy.  Neurological: He is alert and oriented to person, place, and time.  President--- "Dwaine Deter, [place]" [phone] [id number] Recall 3/3  Normal sensation in feet  Skin: No rash noted. No erythema.  No foot lesions  Psychiatric: He has a normal mood and affect. His behavior is normal.           Assessment & Plan:

## 2019-02-20 NOTE — Assessment & Plan Note (Signed)
BMI ~37 with diabetes, HTN, OSA, etc Discussed needed lifestyle changes

## 2019-02-20 NOTE — Assessment & Plan Note (Signed)
See social history 

## 2019-02-25 DIAGNOSIS — E1165 Type 2 diabetes mellitus with hyperglycemia: Secondary | ICD-10-CM

## 2019-03-06 ENCOUNTER — Other Ambulatory Visit: Payer: Self-pay | Admitting: Internal Medicine

## 2019-03-25 ENCOUNTER — Emergency Department: Payer: Medicare HMO

## 2019-03-25 ENCOUNTER — Encounter: Payer: Self-pay | Admitting: Emergency Medicine

## 2019-03-25 ENCOUNTER — Observation Stay
Admission: EM | Admit: 2019-03-25 | Discharge: 2019-03-27 | Disposition: A | Discharge status: Home / Self Care | Payer: Medicare HMO | Attending: Internal Medicine | Admitting: Internal Medicine

## 2019-03-25 ENCOUNTER — Ambulatory Visit: Payer: Medicare HMO | Admitting: Internal Medicine

## 2019-03-25 ENCOUNTER — Other Ambulatory Visit: Payer: Self-pay

## 2019-03-25 DIAGNOSIS — Z79899 Other long term (current) drug therapy: Secondary | ICD-10-CM | POA: Diagnosis not present

## 2019-03-25 DIAGNOSIS — N529 Male erectile dysfunction, unspecified: Secondary | ICD-10-CM | POA: Insufficient documentation

## 2019-03-25 DIAGNOSIS — R0602 Shortness of breath: Secondary | ICD-10-CM | POA: Diagnosis not present

## 2019-03-25 DIAGNOSIS — I10 Essential (primary) hypertension: Secondary | ICD-10-CM | POA: Diagnosis present

## 2019-03-25 DIAGNOSIS — E1151 Type 2 diabetes mellitus with diabetic peripheral angiopathy without gangrene: Secondary | ICD-10-CM | POA: Insufficient documentation

## 2019-03-25 DIAGNOSIS — R197 Diarrhea, unspecified: Secondary | ICD-10-CM | POA: Diagnosis present

## 2019-03-25 DIAGNOSIS — E1165 Type 2 diabetes mellitus with hyperglycemia: Secondary | ICD-10-CM | POA: Insufficient documentation

## 2019-03-25 DIAGNOSIS — E860 Dehydration: Secondary | ICD-10-CM | POA: Diagnosis not present

## 2019-03-25 DIAGNOSIS — Z91013 Allergy to seafood: Secondary | ICD-10-CM | POA: Diagnosis not present

## 2019-03-25 DIAGNOSIS — E785 Hyperlipidemia, unspecified: Secondary | ICD-10-CM | POA: Diagnosis present

## 2019-03-25 DIAGNOSIS — Z7984 Long term (current) use of oral hypoglycemic drugs: Secondary | ICD-10-CM | POA: Diagnosis not present

## 2019-03-25 DIAGNOSIS — K6389 Other specified diseases of intestine: Secondary | ICD-10-CM | POA: Diagnosis not present

## 2019-03-25 DIAGNOSIS — R531 Weakness: Secondary | ICD-10-CM

## 2019-03-25 DIAGNOSIS — E1159 Type 2 diabetes mellitus with other circulatory complications: Secondary | ICD-10-CM | POA: Diagnosis present

## 2019-03-25 DIAGNOSIS — Z886 Allergy status to analgesic agent status: Secondary | ICD-10-CM | POA: Insufficient documentation

## 2019-03-25 DIAGNOSIS — K219 Gastro-esophageal reflux disease without esophagitis: Secondary | ICD-10-CM | POA: Diagnosis not present

## 2019-03-25 DIAGNOSIS — Z833 Family history of diabetes mellitus: Secondary | ICD-10-CM | POA: Diagnosis not present

## 2019-03-25 DIAGNOSIS — Z20822 Contact with and (suspected) exposure to covid-19: Secondary | ICD-10-CM | POA: Diagnosis not present

## 2019-03-25 DIAGNOSIS — G4733 Obstructive sleep apnea (adult) (pediatric): Secondary | ICD-10-CM | POA: Diagnosis not present

## 2019-03-25 DIAGNOSIS — Z880 Allergy status to penicillin: Secondary | ICD-10-CM | POA: Insufficient documentation

## 2019-03-25 DIAGNOSIS — Z6835 Body mass index (BMI) 35.0-35.9, adult: Secondary | ICD-10-CM | POA: Diagnosis not present

## 2019-03-25 DIAGNOSIS — A09 Infectious gastroenteritis and colitis, unspecified: Secondary | ICD-10-CM | POA: Diagnosis not present

## 2019-03-25 DIAGNOSIS — K529 Noninfective gastroenteritis and colitis, unspecified: Secondary | ICD-10-CM | POA: Diagnosis not present

## 2019-03-25 DIAGNOSIS — E876 Hypokalemia: Secondary | ICD-10-CM | POA: Diagnosis not present

## 2019-03-25 DIAGNOSIS — Z8249 Family history of ischemic heart disease and other diseases of the circulatory system: Secondary | ICD-10-CM | POA: Diagnosis not present

## 2019-03-25 DIAGNOSIS — R600 Localized edema: Secondary | ICD-10-CM | POA: Diagnosis not present

## 2019-03-25 DIAGNOSIS — R109 Unspecified abdominal pain: Secondary | ICD-10-CM | POA: Diagnosis present

## 2019-03-25 LAB — URINALYSIS, COMPLETE (UACMP) WITH MICROSCOPIC
Bacteria, UA: NONE SEEN
Bilirubin Urine: NEGATIVE
Glucose, UA: NEGATIVE mg/dL
Hgb urine dipstick: NEGATIVE
Ketones, ur: 5 mg/dL — AB
Leukocytes,Ua: NEGATIVE
Nitrite: NEGATIVE
Protein, ur: NEGATIVE mg/dL
Specific Gravity, Urine: >1.046 — ABNORMAL HIGH (ref 1.005–1.030)
pH: 5 (ref 5.0–8.0)

## 2019-03-25 LAB — INFLUENZA PANEL BY PCR (TYPE A & B)
Influenza A By PCR: NEGATIVE
Influenza B By PCR: NEGATIVE

## 2019-03-25 LAB — CBC WITH DIFFERENTIAL/PLATELET
Abs Immature Granulocytes: 0.06 10*3/uL (ref 0.00–0.07)
Basophils Absolute: 0 10*3/uL (ref 0.0–0.1)
Basophils Relative: 0 %
Eosinophils Absolute: 0.3 10*3/uL (ref 0.0–0.5)
Eosinophils Relative: 2 %
HCT: 48 % (ref 39.0–52.0)
Hemoglobin: 16 g/dL (ref 13.0–17.0)
Immature Granulocytes: 0 %
Lymphocytes Relative: 8 %
Lymphs Abs: 1.4 10*3/uL (ref 0.7–4.0)
MCH: 30.2 pg (ref 26.0–34.0)
MCHC: 33.3 g/dL (ref 30.0–36.0)
MCV: 90.6 fL (ref 80.0–100.0)
Monocytes Absolute: 1.2 10*3/uL — ABNORMAL HIGH (ref 0.1–1.0)
Monocytes Relative: 7 %
Neutro Abs: 13.2 10*3/uL — ABNORMAL HIGH (ref 1.7–7.7)
Neutrophils Relative %: 83 %
Platelets: 255 10*3/uL (ref 150–400)
RBC: 5.3 MIL/uL (ref 4.22–5.81)
RDW: 13.2 % (ref 11.5–15.5)
WBC: 16.1 10*3/uL — ABNORMAL HIGH (ref 4.0–10.5)
nRBC: 0 % (ref 0.0–0.2)

## 2019-03-25 LAB — COMPREHENSIVE METABOLIC PANEL
ALT: 43 U/L (ref 0–44)
AST: 26 U/L (ref 15–41)
Albumin: 4 g/dL (ref 3.5–5.0)
Alkaline Phosphatase: 55 U/L (ref 38–126)
Anion gap: 12 (ref 5–15)
BUN: 13 mg/dL (ref 8–23)
CO2: 28 mmol/L (ref 22–32)
Calcium: 9 mg/dL (ref 8.9–10.3)
Chloride: 98 mmol/L (ref 98–111)
Creatinine, Ser: 1.02 mg/dL (ref 0.61–1.24)
GFR calc Af Amer: >60 mL/min (ref >60)
GFR calc non Af Amer: >60 mL/min (ref >60)
Glucose, Bld: 174 mg/dL — ABNORMAL HIGH (ref 70–99)
Potassium: 3.1 mmol/L — ABNORMAL LOW (ref 3.5–5.1)
Sodium: 138 mmol/L (ref 135–145)
Total Bilirubin: 2.1 mg/dL — ABNORMAL HIGH (ref 0.3–1.2)
Total Protein: 7.8 g/dL (ref 6.5–8.1)

## 2019-03-25 LAB — C DIFFICILE QUICK SCREEN W PCR REFLEX
C Diff antigen: NEGATIVE
C Diff interpretation: NOT DETECTED
C Diff toxin: NEGATIVE

## 2019-03-25 LAB — POC SARS CORONAVIRUS 2 AG: SARS Coronavirus 2 Ag: NEGATIVE

## 2019-03-25 LAB — SARS CORONAVIRUS 2 (TAT 6-24 HRS): SARS Coronavirus 2: NEGATIVE

## 2019-03-25 LAB — TROPONIN I (HIGH SENSITIVITY): Troponin I (High Sensitivity): 3 ng/L (ref <18)

## 2019-03-25 LAB — LACTIC ACID, PLASMA: Lactic Acid, Venous: 1.3 mmol/L (ref 0.5–1.9)

## 2019-03-25 LAB — LIPASE, BLOOD: Lipase: 22 U/L (ref 11–51)

## 2019-03-25 LAB — GLUCOSE, CAPILLARY: Glucose-Capillary: 162 mg/dL — ABNORMAL HIGH (ref 70–99)

## 2019-03-25 MED ORDER — ONDANSETRON HCL 4 MG/2ML IJ SOLN
4.0000 mg | Freq: Once | INTRAMUSCULAR | Status: AC
  Filled 2019-03-25: qty 2

## 2019-03-25 MED ORDER — IOHEXOL 350 MG/ML SOLN
100.0000 mL | Freq: Once | INTRAVENOUS | Status: AC | PRN
  Administered 2019-03-25: 100 mL via INTRAVENOUS
  Filled 2019-03-25: qty 100

## 2019-03-25 MED ORDER — ONDANSETRON HCL 4 MG/2ML IJ SOLN
INTRAMUSCULAR | Status: AC
  Administered 2019-03-25: 4 mg via INTRAVENOUS
  Filled 2019-03-25: qty 2

## 2019-03-25 MED ORDER — SODIUM CHLORIDE 0.9 % IV BOLUS
500.0000 mL | Freq: Once | INTRAVENOUS | Status: AC
  Administered 2019-03-25: 500 mL via INTRAVENOUS

## 2019-03-25 MED ORDER — DICYCLOMINE HCL 10 MG PO CAPS
10.0000 mg | ORAL_CAPSULE | Freq: Once | ORAL | Status: AC
  Administered 2019-03-25: 10 mg via ORAL
  Filled 2019-03-25: qty 1

## 2019-03-25 MED ORDER — POTASSIUM CHLORIDE CRYS ER 20 MEQ PO TBCR
40.0000 meq | EXTENDED_RELEASE_TABLET | Freq: Once | ORAL | Status: AC
  Administered 2019-03-25: 40 meq via ORAL
  Filled 2019-03-25: qty 2

## 2019-03-25 MED ORDER — FAMOTIDINE IN NACL 20-0.9 MG/50ML-% IV SOLN
20.0000 mg | Freq: Once | INTRAVENOUS | Status: AC
  Administered 2019-03-25: 20 mg via INTRAVENOUS
  Filled 2019-03-25: qty 50

## 2019-03-25 NOTE — ED Notes (Signed)
Pt ambulatory to restroom with NAD noted. Steady gait

## 2019-03-25 NOTE — ED Notes (Signed)
See triage note  Presents with some nausea and diarrhea  States sx's started couple of days ago  Afebrile on arrival  But states he was exposed to [place]

## 2019-03-25 NOTE — ED Triage Notes (Signed)
Nausea, diarrhea began yesterday. Has been exposed to [place].

## 2019-03-25 NOTE — ED Provider Notes (Signed)
[facility] Emergency Department Provider Note  ____________________________________________  Time seen: Approximately 3:49 PM  I have reviewed the triage vital signs and the nursing notes.   HISTORY  Chief Complaint Nausea, Diarrhea, and COVID exposure    HPI James Camacho is a 69 y.o. male that presents to the emergency department for evaluation of chills, shortness of breath, nausea, abdominal discomfort, diarrhea for 1 day.  Patient states that symptoms started last night.  Patient states his whole abdomen is sore and bloated but worse to the upper abdomen.  He has not checked his temperature but had had chills.  His chest is not painful but feels "shaky." He has not had any vomiting but has had 3 episodes of dry heaves.  He is having about 5 episodes of watery diarrhea every hour. Patient works for the [employer] and has been around deceased Covid patients.  Patient received his [place] vaccination 1.5 weeks ago.  He felt similarly the day after receiving his vaccine and thought it was side effects of the vaccine.  He also felt similarly 8 days ago.  Patient called [facility] and was going to have an appointment there but was referred to the emergency department.  Past Medical History:  Diagnosis Date  . Anal condylomata   . Anxiety   . Depression   . ED (erectile dysfunction)   . History of adenomatous polyp of colon   . History of exercise stress test    ETT 04-17-2007 (in epic)  negative  . Hyperlipidemia    LDL in 120's  . Hypertension    followed by pcp  . OSA (obstructive sleep apnea)   . OSA on CPAP    study in epic 08-11-2017 moderate osa  . Seasonal allergic rhinitis   . Type 2 diabetes mellitus ([place])    followed by pcp  . Wears glasses   . Wears hearing aid in both ears     Patient Active Problem List   Diagnosis Date Noted  . GERD (gastroesophageal reflux disease) 02/20/2019  . OSA (obstructive sleep apnea)   . Condyloma  acuminatum of anus 12/20/2018  . Urinary urgency 05/28/2018  . Advance directive discussed with patient 06/20/2017  . Type 2 diabetes mellitus with vascular disease ([place]) 01/04/2017  . Allergic reaction to shellfish 07/25/2014  . Severe obesity (BMI 35.0-39.9) with comorbidity ([place]) 07/12/2011  . Routine general medical examination at a health care facility 01/11/2011  . Actinic keratosis 01/11/2011  . Sleep disturbance 07/06/2010  . Essential hypertension, benign 07/06/2007  . 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 CATARACT EXTRACTION W/PHACO Right 06/28/2017   Procedure: CATARACT EXTRACTION PHACO AND INTRAOCULAR LENS PLACEMENT ([place]) RIGHT;  Surgeon: Leandrew Koyanagi, MD;  Location: [facility];  Service: Ophthalmology;  Laterality: Right;  . CATARACT EXTRACTION W/PHACO Left 07/19/2017   Procedure: CATARACT EXTRACTION PHACO AND INTRAOCULAR LENS PLACEMENT ([place]) LEFT TOPICAL;  Surgeon: Leandrew Koyanagi, MD;  Location: [facility];  Service: Ophthalmology;  Laterality: Left;  IVA TOPICALLEFTDIABETIC  . CHOLECYSTECTOMY OPEN  1980s   dr  Sharlet [place]   AND APPENDECTOMY  . COLONOSCOPY WITH PROPOFOL  last one 03-28-2018   dr pyrtle  . LASER ABLATION CONDOLAMATA N/A 12/20/2018   Procedure: LASER ABLATION OF CONDOLAMATA, EXAMINATION UNDER ANESTHESIA;  Surgeon: Michael Boston, MD;  Location: [facility];  Service: General;  Laterality: N/A;    Prior to Admission medications   Medication Sig Start Date  End Date Taking? Authorizing Provider  amLODipine (NORVASC) 10 MG tablet TAKE 1 TABLET BY MOUTH EVERY DAY Patient taking differently: Take 10 mg by mouth daily.  10/23/18   Venia Carbon, MD  atorvastatin (LIPITOR) 10 MG tablet TAKE 1 TABLET BY MOUTH EVERY DAY 12/25/18   Viviana Simpler I, MD  carvedilol (COREG) 12.5 MG tablet TAKE 1 TABLET (12.5 MG TOTAL) BY MOUTH 2 (TWO) TIMES DAILY WITH A MEAL. 03/06/19    Venia Carbon, MD  fluticasone (FLONASE) 50 MCG/ACT nasal spray Place 2 sprays into both nostrils daily. Patient taking differently: Place 2 sprays into both nostrils as needed.  08/29/18   Venia Carbon, MD  furosemide (LASIX) 40 MG tablet Take 1 tablet (40 mg total) by mouth daily. Patient taking differently: Take 40 mg by mouth daily.  11/22/18   Venia Carbon, MD  Insulin Pen Needle (PEN NEEDLES) 32G X 6 MM MISC 1 each by Does not apply route daily. Use with Victoza once a day 09/05/18   Venia Carbon, MD  liraglutide (VICTOZA) 18 MG/3ML SOPN Inject 1.8 mg into the skin.    [provider]  lisinopril (ZESTRIL) 40 MG tablet TAKE 1 TABLET BY MOUTH EVERY DAY Patient taking differently: Take 40 mg by mouth daily.  10/23/18   Venia Carbon, MD  metFORMIN (GLUCOPHAGE-XR) 500 MG 24 hr tablet TAKE 1 TABLET BY MOUTH EVERY DAY WITH BREAKFAST 03/06/19   Venia Carbon, MD  omeprazole (PRILOSEC) 20 MG capsule Take 1 capsule (20 mg total) by mouth daily. 02/20/19   Venia Carbon, MD  oxyCODONE (OXY IR/ROXICODONE) 5 MG immediate release tablet Take 1-2 tablets (5-10 mg total) by mouth every 6 (six) hours as needed for moderate pain, severe pain or breakthrough pain. 12/20/18   Michael Boston, MD  sildenafil (REVATIO) 20 MG tablet TAKE 3 TO 5 TABLETS BY MOUTH EVERY DAY AS DIRECTED Patient taking differently: as needed.  10/15/18   Venia Carbon, MD    Allergies Aleve [naproxen sodium], Other, Penicillins, and Shellfish allergy  Family History  Problem Relation Age of Onset  . Diabetes Mother   . Arthritis Mother   . Hypertension Mother   . Chronic Renal Failure Mother   . Parkinsonism Father   . Heart disease Father   . Healthy Sister   . Healthy Brother   . Healthy Sister   . Cancer Neg Hx        no colon or prostate cancer  . Colon cancer Neg Hx   . Esophageal cancer Neg Hx   . Stomach cancer Neg Hx   . Rectal cancer Neg Hx     Social History Social  History   Tobacco Use  . Smoking status: Never Smoker  . Smokeless tobacco: Never Used  Substance Use Topics  . Alcohol use: Not Currently    Comment: rare  . Drug use: Never     Review of Systems  Constitutional: Positive for chills Eyes: No visual changes. No discharge. ENT: Negative for congestion and rhinorrhea. Cardiovascular: No chest pain. Respiratory: Negative for cough. Positive for SOB. Gastrointestinal: Positive for abdominal discomfort. Positive for nausea. No vomiting.  Positive for diarrhea.  No constipation. Musculoskeletal: Negative for musculoskeletal pain. Skin: Negative for rash, abrasions, lacerations, ecchymosis. Neurological: Negative for headaches. Psychiatric: No mood changes.   ____________________________________________   PHYSICAL EXAM:  VITAL SIGNS: ED Triage Vitals  Enc Vitals Group     BP 03/25/19 1531 (!) 146/88  Pulse Rate 03/25/19 1531 (!) 103     Resp 03/25/19 1531 17     Temp 03/25/19 1531 98.2 F (36.8 C)     Temp Source 03/25/19 1531 Oral     SpO2 03/25/19 1531 91 %     Weight 03/25/19 1534 237 lb (107.5 kg)     Height 03/25/19 1534 5\' 9"  (1.753 m)     Head Circumference --      Peak Flow --      Pain Score 03/25/19 1534 8     Pain Loc --      Pain Edu? --      Excl. in [place]? --      Constitutional: Alert and oriented. Well appearing and in no acute distress. Eyes: Conjunctivae are normal. PERRL. EOMI. No discharge. Head: Atraumatic. ENT: No frontal and maxillary sinus tenderness.      Ears:       Nose: No congestion/rhinnorhea.      Mouth/Throat: Mucous membranes are moist.  Neck: No stridor.   Hematological/Lymphatic/Immunilogical: No cervical lymphadenopathy. Cardiovascular: Normal rate, regular rhythm.  Good peripheral circulation. Respiratory: Normal respiratory effort without tachypnea or retractions. Lungs CTAB. Good air entry to the bases with no decreased or absent breath sounds. Gastrointestinal: Bowel  sounds 4 quadrants. Mild diffuse tenderness to palpation in all four quadrants. No guarding or rigidity. No palpable masses. No distention. Musculoskeletal: Full range of motion to all extremities. No gross deformities appreciated. Neurologic:  Normal speech and language. No gross focal neurologic deficits are appreciated.  Skin:  Skin is warm, dry and intact. No rash noted. Psychiatric: Mood and affect are normal. Speech and behavior are normal. Patient exhibits appropriate insight and judgement.   ____________________________________________   LABS (all labs ordered are listed, but only abnormal results are displayed)  Labs Reviewed  CBC WITH DIFFERENTIAL/PLATELET - Abnormal; Notable for the following components:      Result Value   WBC 16.1 (*)    Neutro Abs 13.2 (*)    Monocytes Absolute 1.2 (*)    All other components within normal limits  COMPREHENSIVE METABOLIC PANEL - Abnormal; Notable for the following components:   Potassium 3.1 (*)    Glucose, Bld 174 (*)    Total Bilirubin 2.1 (*)    All other components within normal limits  URINALYSIS, COMPLETE (UACMP) WITH MICROSCOPIC - Abnormal; Notable for the following components:   Color, Urine YELLOW (*)    APPearance CLEAR (*)    Specific Gravity, Urine >1.046 (*)    Ketones, ur 5 (*)    All other components within normal limits  GLUCOSE, CAPILLARY - Abnormal; Notable for the following components:   Glucose-Capillary 162 (*)    All other components within normal limits  C DIFFICILE QUICK SCREEN W PCR REFLEX  SARS CORONAVIRUS 2 (TAT 6-24 HRS)  GI PATHOGEN PANEL BY PCR, STOOL  LACTIC ACID, PLASMA  LIPASE, BLOOD  INFLUENZA PANEL BY PCR (TYPE A & B)  POC SARS CORONAVIRUS 2 AG -  ED  CBG MONITORING, ED  POC SARS CORONAVIRUS 2 AG  TROPONIN I (HIGH SENSITIVITY)   ____________________________________________  EKG  SR ____________________________________________  RADIOLOGY Robinette Haines, personally viewed and  evaluated these images (plain radiographs) as part of my medical decision making, as well as reviewing the written report by the radiologist.  DG Chest 2 View  Result Date: 03/25/2019 CLINICAL DATA:  Shortness of breath, nausea, diarrhea, exposed to COVID; past history diabetes mellitus, hypertension EXAM: CHEST - 2  VIEW COMPARISON:  04/16/2007 FINDINGS: Minimal enlargement of cardiac silhouette. Mediastinal contours and pulmonary vascularity normal. Lungs clear. No pleural effusion or pneumothorax. Scattered endplate spur formation thoracic spine. IMPRESSION: No acute abnormalities. Electronically Signed   By: Lavonia Dana M.D.   On: 03/25/2019 16:16   CT Angio Chest PE W and/or Wo Contrast  Result Date: 03/25/2019 CLINICAL DATA:  Shortness of breath, diarrhea EXAM: CT ANGIOGRAPHY CHEST CT ABDOMEN AND PELVIS WITH CONTRAST TECHNIQUE: Multidetector CT imaging of the chest was performed using the standard protocol during bolus administration of intravenous contrast. Multiplanar CT image reconstructions and MIPs were obtained to evaluate the vascular anatomy. Multidetector CT imaging of the abdomen and pelvis was performed using the standard protocol during bolus administration of intravenous contrast. CONTRAST:  15mL OMNIPAQUE IOHEXOL 350 MG/ML SOLN COMPARISON:  None. FINDINGS: CTA CHEST FINDINGS Cardiovascular: Satisfactory opacification of the pulmonary arteries to the segmental level. No evidence of pulmonary embolism. Normal heart size. No pericardial effusion. Mediastinum/Nodes: No enlarged mediastinal, hilar, or axillary lymph nodes. Thyroid gland, trachea, and esophagus demonstrate no significant findings. Lungs/Pleura: No consolidation or mass. Mild bibasilar atelectasis. No pleural effusion or pneumothorax. Musculoskeletal: No acute abnormality Review of the MIP images confirms the above findings. CT ABDOMEN and PELVIS FINDINGS Hepatobiliary: No focal liver abnormality is seen. Status post  cholecystectomy. No biliary dilatation. Pancreas: Unremarkable. Spleen: Unremarkable. Adrenals/Urinary Tract: Unremarkable. Stomach/Bowel: Minimally distended loops of distal small bowel without evidence of obstruction. Questionable mild small bowel mucosal hyperenhancement. Colon is decompressed. Appendix is not definitely seen. Vascular/Lymphatic: No significant vascular findings are present. No enlarged abdominal or pelvic lymph nodes. Reproductive: Unremarkable Other: Mild right lower quadrant free fluid. Musculoskeletal: No acute abnormality Review of the MIP images confirms the above findings. IMPRESSION: No acute pulmonary embolism.  No evidence of pneumonia. Minimally distended loops of distal small bowel. Questionable mild small bowel mucosal hyperenhancement. Small volume right lower quadrant free fluid. These findings are nonspecific and may reflect enteritis given history. Electronically Signed   By: Macy Mis M.D.   On: 03/25/2019 18:32   CT ABDOMEN PELVIS W CONTRAST  Result Date: 03/25/2019 CLINICAL DATA:  Shortness of breath, diarrhea EXAM: CT ANGIOGRAPHY CHEST CT ABDOMEN AND PELVIS WITH CONTRAST TECHNIQUE: Multidetector CT imaging of the chest was performed using the standard protocol during bolus administration of intravenous contrast. Multiplanar CT image reconstructions and MIPs were obtained to evaluate the vascular anatomy. Multidetector CT imaging of the abdomen and pelvis was performed using the standard protocol during bolus administration of intravenous contrast. CONTRAST:  110mL OMNIPAQUE IOHEXOL 350 MG/ML SOLN COMPARISON:  None. FINDINGS: CTA CHEST FINDINGS Cardiovascular: Satisfactory opacification of the pulmonary arteries to the segmental level. No evidence of pulmonary embolism. Normal heart size. No pericardial effusion. Mediastinum/Nodes: No enlarged mediastinal, hilar, or axillary lymph nodes. Thyroid gland, trachea, and esophagus demonstrate no significant findings.  Lungs/Pleura: No consolidation or mass. Mild bibasilar atelectasis. No pleural effusion or pneumothorax. Musculoskeletal: No acute abnormality Review of the MIP images confirms the above findings. CT ABDOMEN and PELVIS FINDINGS Hepatobiliary: No focal liver abnormality is seen. Status post cholecystectomy. No biliary dilatation. Pancreas: Unremarkable. Spleen: Unremarkable. Adrenals/Urinary Tract: Unremarkable. Stomach/Bowel: Minimally distended loops of distal small bowel without evidence of obstruction. Questionable mild small bowel mucosal hyperenhancement. Colon is decompressed. Appendix is not definitely seen. Vascular/Lymphatic: No significant vascular findings are present. No enlarged abdominal or pelvic lymph nodes. Reproductive: Unremarkable Other: Mild right lower quadrant free fluid. Musculoskeletal: No acute abnormality Review of the MIP images confirms the  above findings. IMPRESSION: No acute pulmonary embolism.  No evidence of pneumonia. Minimally distended loops of distal small bowel. Questionable mild small bowel mucosal hyperenhancement. Small volume right lower quadrant free fluid. These findings are nonspecific and may reflect enteritis given history. Electronically Signed   By: Macy Mis M.D.   On: 03/25/2019 18:32    ____________________________________________    PROCEDURES  Procedure(s) performed:    Procedures    Medications  sodium chloride 0.9 % bolus 500 mL (0 mLs Intravenous Stopped 03/25/19 2039)  ondansetron (ZOFRAN) injection 4 mg (4 mg Intravenous Given 03/25/19 1628)  potassium chloride SA (KLOR-CON) CR tablet 40 mEq (40 mEq Oral Given 03/25/19 1721)  iohexol (OMNIPAQUE) 350 MG/ML injection 100 mL (100 mLs Intravenous Contrast Given 03/25/19 1803)  sodium chloride 0.9 % bolus 500 mL (0 mLs Intravenous Stopped 03/25/19 2258)  dicyclomine (BENTYL) capsule 10 mg (10 mg Oral Given 03/25/19 2019)  famotidine (PEPCID) IVPB 20 mg premix (0 mg Intravenous Stopped  03/25/19 2258)     ____________________________________________   INITIAL IMPRESSION / ASSESSMENT AND PLAN / ED COURSE  Pertinent labs & imaging results that were available during my care of the patient were reviewed by me and considered in my medical decision making (see chart for details).  Review of the [place] CSRS was performed in accordance of the [place] prior to dispensing any controlled drugs.  Differential diagnosis includes, but is not limited to, acute appendicitis, renal colic, urinary tract infection/pyelonephritis, prostatitis,  diverticulitis, small bowel obstruction or ileus, colitis, abdominal aortic aneurysm, gastroenteritis, hernia, biliary disease, ACS, gastritis, duodenitis, pancreatitis, abdominal aortic aneurysm,  ulcer(s), pulmonary embolism,  pneumothorax, pneumonia, pericarditis, myocarditis.   Patient's diagnosis is consistent with gastroenteritis, hypokalemia, and weakness. Vital signs and exam are reassuring.  Patient has leukocytosis with a white blood cell count of 16.1.  Potassium 3.1 and was supplemented.  CT scan findings most consistent with enteritis.  Cdiff is negative. Stool panel is in progress. Patient given 1 L normal saline, 20 mL Pepcid, 10 mg Bentyl and 4 mg Zofran.  Abdominal discomfort has improved with medications but patient states that he still feels weak. He feels too weak to go home at this time and is concerned that he would have to return to the emergency department if he were to go home. Patient will be admitted to the hospitalist service for gastroenteritis, hypokalemia, and weakness. Report was given to Dr. Flossie Buffy, who is agreable with admission.   Eyas Moron was evaluated in Emergency Department on 03/25/2019 for the symptoms described in the history of present illness. He was evaluated in the context of the global COVID-19 pandemic, which necessitated consideration that the patient might be at risk for infection with the SARS-CoV-2 virus that  causes COVID-19. Institutional protocols and algorithms that pertain to the evaluation of patients at risk for COVID-19 are in a state of rapid change based on information released by regulatory bodies including the CDC and federal and state organizations. These policies and algorithms were followed during the patient's care in the ED.   ____________________________________________  FINAL CLINICAL IMPRESSION(S) / ED DIAGNOSES  Final diagnoses:  Diarrhea of presumed infectious origin  Dehydration  Weakness  Hypokalemia      NEW MEDICATIONS STARTED DURING THIS VISIT:  ED Discharge Orders    None          This chart was dictated using voice recognition software/Dragon. Despite best efforts to proofread, errors can occur which can change the meaning. Any change was purely  unintentional.    Laban Emperor, PA-C 03/26/19 [place], [place], [place] 03/28/19 [phone]

## 2019-03-25 NOTE — ED Notes (Signed)
Pt given meal tray and ice water at this time

## 2019-03-26 ENCOUNTER — Encounter: Payer: Self-pay | Admitting: Family Medicine

## 2019-03-26 DIAGNOSIS — E876 Hypokalemia: Secondary | ICD-10-CM | POA: Diagnosis present

## 2019-03-26 DIAGNOSIS — R197 Diarrhea, unspecified: Secondary | ICD-10-CM

## 2019-03-26 DIAGNOSIS — E1159 Type 2 diabetes mellitus with other circulatory complications: Secondary | ICD-10-CM

## 2019-03-26 DIAGNOSIS — E1165 Type 2 diabetes mellitus with hyperglycemia: Secondary | ICD-10-CM

## 2019-03-26 DIAGNOSIS — E1151 Type 2 diabetes mellitus with diabetic peripheral angiopathy without gangrene: Secondary | ICD-10-CM

## 2019-03-26 DIAGNOSIS — E785 Hyperlipidemia, unspecified: Secondary | ICD-10-CM

## 2019-03-26 DIAGNOSIS — I10 Essential (primary) hypertension: Secondary | ICD-10-CM | POA: Diagnosis not present

## 2019-03-26 DIAGNOSIS — K219 Gastro-esophageal reflux disease without esophagitis: Secondary | ICD-10-CM | POA: Diagnosis not present

## 2019-03-26 DIAGNOSIS — G4733 Obstructive sleep apnea (adult) (pediatric): Secondary | ICD-10-CM

## 2019-03-26 DIAGNOSIS — K529 Noninfective gastroenteritis and colitis, unspecified: Secondary | ICD-10-CM | POA: Diagnosis present

## 2019-03-26 LAB — BASIC METABOLIC PANEL
Anion gap: 9 (ref 5–15)
BUN: 12 mg/dL (ref 8–23)
CO2: 28 mmol/L (ref 22–32)
Calcium: 8.7 mg/dL — ABNORMAL LOW (ref 8.9–10.3)
Chloride: 102 mmol/L (ref 98–111)
Creatinine, Ser: 1.01 mg/dL (ref 0.61–1.24)
GFR calc Af Amer: >60 mL/min (ref >60)
GFR calc non Af Amer: >60 mL/min (ref >60)
Glucose, Bld: 151 mg/dL — ABNORMAL HIGH (ref 70–99)
Potassium: 3.7 mmol/L (ref 3.5–5.1)
Sodium: 139 mmol/L (ref 135–145)

## 2019-03-26 LAB — HIV ANTIBODY (ROUTINE TESTING W REFLEX): HIV Screen 4th Generation wRfx: NONREACTIVE

## 2019-03-26 LAB — CBC
HCT: 46.2 % (ref 39.0–52.0)
Hemoglobin: 14.8 g/dL (ref 13.0–17.0)
MCH: 30 pg (ref 26.0–34.0)
MCHC: 32 g/dL (ref 30.0–36.0)
MCV: 93.5 fL (ref 80.0–100.0)
Platelets: 241 10*3/uL (ref 150–400)
RBC: 4.94 MIL/uL (ref 4.22–5.81)
RDW: 13.3 % (ref 11.5–15.5)
WBC: 14.5 10*3/uL — ABNORMAL HIGH (ref 4.0–10.5)
nRBC: 0 % (ref 0.0–0.2)

## 2019-03-26 LAB — GLUCOSE, CAPILLARY
Glucose-Capillary: 134 mg/dL — ABNORMAL HIGH (ref 70–99)
Glucose-Capillary: 183 mg/dL — ABNORMAL HIGH (ref 70–99)
Glucose-Capillary: 111 mg/dL — ABNORMAL HIGH (ref 70–99)
Glucose-Capillary: 178 mg/dL — ABNORMAL HIGH (ref 70–99)

## 2019-03-26 LAB — TSH: TSH: 1.763 u[IU]/mL (ref 0.350–4.500)

## 2019-03-26 MED ORDER — CIPROFLOXACIN IN D5W 400 MG/200ML IV SOLN
400.0000 mg | Freq: Two times a day (BID) | INTRAVENOUS | Status: DC
  Administered 2019-03-26 – 2019-03-27 (×3): 400 mg via INTRAVENOUS
  Filled 2019-03-26 (×5): qty 200

## 2019-03-26 MED ORDER — LISINOPRIL 20 MG PO TABS
40.0000 mg | ORAL_TABLET | Freq: Every day | ORAL | Status: DC
  Administered 2019-03-26 – 2019-03-27 (×2): 40 mg via ORAL
  Filled 2019-03-26 (×2): qty 2

## 2019-03-26 MED ORDER — FUROSEMIDE 40 MG PO TABS
40.0000 mg | ORAL_TABLET | Freq: Every day | ORAL | Status: DC
  Administered 2019-03-26 – 2019-03-27 (×2): 40 mg via ORAL
  Filled 2019-03-26 (×2): qty 1

## 2019-03-26 MED ORDER — PANTOPRAZOLE SODIUM 40 MG IV SOLR
40.0000 mg | Freq: Every day | INTRAVENOUS | Status: DC
  Administered 2019-03-26 – 2019-03-27 (×2): 40 mg via INTRAVENOUS
  Filled 2019-03-26 (×2): qty 40

## 2019-03-26 MED ORDER — METRONIDAZOLE 500 MG PO TABS: 500.0000 mg | ORAL_TABLET | Freq: Three times a day (TID) | ORAL | 0 refills | Status: DC

## 2019-03-26 MED ORDER — POTASSIUM CHLORIDE 20 MEQ PO PACK
40.0000 meq | PACK | Freq: Once | ORAL | Status: AC
  Administered 2019-03-26: 40 meq via ORAL
  Filled 2019-03-26: qty 2

## 2019-03-26 MED ORDER — AMLODIPINE BESYLATE 10 MG PO TABS
10.0000 mg | ORAL_TABLET | Freq: Every day | ORAL | Status: DC
  Administered 2019-03-26 – 2019-03-27 (×2): 10 mg via ORAL
  Filled 2019-03-26 (×2): qty 1

## 2019-03-26 MED ORDER — ATORVASTATIN CALCIUM 10 MG PO TABS
10.0000 mg | ORAL_TABLET | Freq: Every day | ORAL | Status: DC
  Administered 2019-03-26: 10 mg via ORAL
  Filled 2019-03-26: qty 1

## 2019-03-26 MED ORDER — METRONIDAZOLE 500 MG PO TABS
500.0000 mg | ORAL_TABLET | Freq: Three times a day (TID) | ORAL | Status: DC
  Administered 2019-03-26 – 2019-03-27 (×4): 500 mg via ORAL
  Filled 2019-03-26 (×4): qty 1

## 2019-03-26 MED ORDER — ONDANSETRON HCL 4 MG/2ML IJ SOLN: 4.0000 mg | Freq: Four times a day (QID) | INTRAMUSCULAR | Status: DC | PRN

## 2019-03-26 MED ORDER — CIPROFLOXACIN HCL 500 MG PO TABS: 500.0000 mg | ORAL_TABLET | Freq: Two times a day (BID) | ORAL | 0 refills | Status: AC

## 2019-03-26 MED ORDER — ENOXAPARIN SODIUM 40 MG/0.4ML ~~LOC~~ SOLN
40.0000 mg | SUBCUTANEOUS | Status: DC
  Administered 2019-03-26 – 2019-03-27 (×2): 40 mg via SUBCUTANEOUS
  Filled 2019-03-26 (×2): qty 0.4

## 2019-03-26 MED ORDER — INSULIN ASPART 100 UNIT/ML ~~LOC~~ SOLN
0.0000 [IU] | Freq: Three times a day (TID) | SUBCUTANEOUS | Status: DC
  Administered 2019-03-26: 3 [IU] via SUBCUTANEOUS
  Administered 2019-03-26: 2 [IU] via SUBCUTANEOUS
  Administered 2019-03-27: 3 [IU] via SUBCUTANEOUS
  Administered 2019-03-27: 5 [IU] via SUBCUTANEOUS
  Filled 2019-03-26 (×4): qty 1

## 2019-03-26 MED ORDER — CARVEDILOL 12.5 MG PO TABS
12.5000 mg | ORAL_TABLET | Freq: Two times a day (BID) | ORAL | Status: DC
  Administered 2019-03-26 – 2019-03-27 (×3): 12.5 mg via ORAL
  Filled 2019-03-26 (×3): qty 1

## 2019-03-26 MED ORDER — METRONIDAZOLE IN NACL 5-0.79 MG/ML-% IV SOLN
500.0000 mg | Freq: Three times a day (TID) | INTRAVENOUS | Status: DC
  Filled 2019-03-26 (×3): qty 100

## 2019-03-26 NOTE — H&P (Signed)
History and Physical    James Camacho [id number] DOB: March 16, 1950 DOA: 03/25/2019  PCP: Venia Carbon, MD  Patient coming from: Home  I have personally briefly reviewed patient's old medical records in [place]  Chief Complaint: diarrhea and abdominal pain  HPI: James Camacho is a 69 y.o. male with medical history significant of hypertension, type 2 diabetes, OSA on CPAP, and GERD who presents with concerns of abdominal pain and diarrhea. Patient reports that he got the Covid vaccine about a week and a half ago and the day following he had about 24 hours of diarrhea that he thought was due to side effects from the vaccine. Then several days following that he again had similar symptoms and had diarrhea for about 48 hours. This time symptoms started last night shortly after dinner.  He had diarrhea about 4-5 times along with dry heaves but no vomiting.  Denies any blood or mucus in the diarrhea.  He also feels a headache and that his abdomen is very bloated.  He has had 2 more episodes of diarrhea since he has been in the ED. Denies any sick contacts.  No fever.  Temperature has only gone up to 99. He denies any tobacco, alcohol or illicit drug use. Patient works for the [employer] and transport bodies including those that are Covid positive.  ED Course: He was afebrile and stable vitals. Lab work notable for WBC of 16.1, potassium of 3.1, total bilirubin of 2.1.  Lipase negative at 22. Cdiff negative. POC COVID test negative.  CTA chest negative for PE.  CT abdomen with questionable mild small bowel mucosal enhancement that may reflect enteritis.  He received 1L NS bolus, 4mg  Zofran, 20 mg of famotidine and 10 mg of Bentyl.  Review of Systems:  Constitutional: No Weight Change, No Fever ENT/Mouth: No sore throat, No Rhinorrhea Eyes:  No Vision Changes Cardiovascular: No Chest Pain, no SOB Respiratory: No Cough, No Sputum Gastrointestinal: + Nausea, No  Vomiting, + Diarrhea, No Constipation, No Pain Genitourinary: no Urinary Incontinence, No Urgency, No Flank Pain Musculoskeletal: No Arthralgias, No Myalgias Skin: No Skin Lesions, No Pruritus, Neuro: no Weakness, No Numbness, Psych: No Anxiety/Panic, No Depression, no decrease appetite Heme/Lymph: No Bruising, No Bleeding  Past Medical History:  Diagnosis Date  . Anal condylomata   . Anxiety   . Depression   . ED (erectile dysfunction)   . History of adenomatous polyp of colon   . History of exercise stress test    ETT 04-17-2007 (in epic)  negative  . Hyperlipidemia    LDL in 120's  . Hypertension    followed by pcp  . OSA (obstructive sleep apnea)   . OSA on CPAP    study in epic 08-11-2017 moderate osa  . Seasonal allergic rhinitis   . Type 2 diabetes mellitus ([place])    followed by pcp  . Wears glasses   . Wears hearing aid in both ears     Past Surgical History:  Procedure Laterality Date  . CATARACT EXTRACTION W/PHACO Right 06/28/2017   Procedure: CATARACT EXTRACTION PHACO AND INTRAOCULAR LENS PLACEMENT ([place]) RIGHT;  Surgeon: Leandrew Koyanagi, MD;  Location: [place];  Service: Ophthalmology;  Laterality: Right;  . CATARACT EXTRACTION W/PHACO Left 07/19/2017   Procedure: CATARACT EXTRACTION PHACO AND INTRAOCULAR LENS PLACEMENT ([place]) LEFT TOPICAL;  Surgeon: Leandrew Koyanagi, MD;  Location: [place];  Service: Ophthalmology;  Laterality: Left;  IVA TOPICALLEFTDIABETIC  . CHOLECYSTECTOMY OPEN  1980s  dr  Shar[place]   AND APPENDECTOMY  . COLONOSCOPY WITH PROPOFOL  last one 03-28-2018   dr pyrtle  . LASER ABLATION CONDOLAMATA N/A 12/20/2018   Procedure: LASER ABLATION OF CONDOLAMATA, EXAMINATION UNDER ANESTHESIA;  Surgeon: Michael Boston, MD;  Location: [place];  Service: General;  Laterality: N/A;     reports that he has never smoked. He has never used smokeless tobacco. He reports previous alcohol use. He reports that he does  not use drugs.  Allergies  Allergen Reactions  . Aleve [Naproxen Sodium] Hives  . Other     Pt can only take Tylenol for pain- anything prescribed that he has tried breaks him out in a rash.   . Penicillins Rash  . Shellfish Allergy Hives    Family History  Problem Relation Age of Onset  . Diabetes Mother   . Arthritis Mother   . Hypertension Mother   . Chronic Renal Failure Mother   . Parkinsonism Father   . Heart disease Father   . Healthy Sister   . Healthy Brother   . Healthy Sister   . Cancer Neg Hx        no colon or prostate cancer  . Colon cancer Neg Hx   . Esophageal cancer Neg Hx   . Stomach cancer Neg Hx   . Rectal cancer Neg Hx      Prior to Admission medications   Medication Sig Start Date End Date Taking? Authorizing Provider  amLODipine (NORVASC) 10 MG tablet TAKE 1 TABLET BY MOUTH EVERY DAY Patient taking differently: Take 10 mg by mouth daily.  10/23/18   Venia Carbon, MD  atorvastatin (LIPITOR) 10 MG tablet TAKE 1 TABLET BY MOUTH EVERY DAY 12/25/18   Viviana Simpler I, MD  carvedilol (COREG) 12.5 MG tablet TAKE 1 TABLET (12.5 MG TOTAL) BY MOUTH 2 (TWO) TIMES DAILY WITH A MEAL. 03/06/19   Venia Carbon, MD  fluticasone (FLONASE) 50 MCG/ACT nasal spray Place 2 sprays into both nostrils daily. Patient taking differently: Place 2 sprays into both nostrils as needed.  08/29/18   Venia Carbon, MD  furosemide (LASIX) 40 MG tablet Take 1 tablet (40 mg total) by mouth daily. Patient taking differently: Take 40 mg by mouth daily.  11/22/18   Venia Carbon, MD  Insulin Pen Needle (PEN NEEDLES) 32G X 6 MM MISC 1 each by Does not apply route daily. Use with Victoza once a day 09/05/18   Venia Carbon, MD  liraglutide (VICTOZA) 18 MG/3ML SOPN Inject 1.8 mg into the skin.    [provider]  lisinopril (ZESTRIL) 40 MG tablet TAKE 1 TABLET BY MOUTH EVERY DAY Patient taking differently: Take 40 mg by mouth daily.  10/23/18   Venia Carbon, MD    metFORMIN (GLUCOPHAGE-XR) 500 MG 24 hr tablet TAKE 1 TABLET BY MOUTH EVERY DAY WITH BREAKFAST 03/06/19   Venia Carbon, MD  omeprazole (PRILOSEC) 20 MG capsule Take 1 capsule (20 mg total) by mouth daily. 02/20/19   Venia Carbon, MD  oxyCODONE (OXY IR/ROXICODONE) 5 MG immediate release tablet Take 1-2 tablets (5-10 mg total) by mouth every 6 (six) hours as needed for moderate pain, severe pain or breakthrough pain. 12/20/18   Michael Boston, MD  sildenafil (REVATIO) 20 MG tablet TAKE 3 TO 5 TABLETS BY MOUTH EVERY DAY AS DIRECTED Patient taking differently: as needed.  10/15/18   Venia Carbon, MD    Physical Exam: Vitals:  03/25/19 1531 03/25/19 1534 03/25/19 1720 03/25/19 2044  BP: (!) 146/88  (!) 155/86 (!) 144/80  Pulse: (!) 103  88 90  Resp: 17  18 18   Temp: 98.2 F (36.8 C)  97.8 F (36.6 C) 98 F (36.7 C)  TempSrc: Oral  Oral Oral  SpO2: 91%  97% 97%  Weight:  107.5 kg    Height:  5\' 9"  (1.753 m)      Constitutional: NAD, calm, comfortable, nontoxic obese male laying flat in bed Vitals:   03/25/19 1531 03/25/19 1534 03/25/19 1720 03/25/19 2044  BP: (!) 146/88  (!) 155/86 (!) 144/80  Pulse: (!) 103  88 90  Resp: 17  18 18   Temp: 98.2 F (36.8 C)  97.8 F (36.6 C) 98 F (36.7 C)  TempSrc: Oral  Oral Oral  SpO2: 91%  97% 97%  Weight:  107.5 kg    Height:  5\' 9"  (1.753 m)     Eyes: PERRL, lids and conjunctivae normal ENMT: Mucous membranes are moist.  Neck: normal, supple Respiratory: clear to auscultation bilaterally, no wheezing, no crackles. Normal respiratory effort on room air.  Cardiovascular: Regular rate and rhythm, no murmurs / rubs / gallops. No extremity edema.  Abdomen: diffuse mild tenderness and moderate distended tight abdomen. Bowel sounds positive.  Musculoskeletal: no clubbing / cyanosis. No joint deformity upper and lower extremities. Good ROM, no contractures. Normal muscle tone.  Skin: no rashes, lesions, ulcers. No  induration Neurologic: CN 2-12 grossly intact. Sensation intact. Strength 5/5 in all 4.  Psychiatric: Normal judgment and insight. Alert and oriented x 3. Normal mood.     Labs on Admission: I have personally reviewed following labs and imaging studies  CBC: Recent Labs  Lab 03/25/19 1617  WBC 16.1*  NEUTROABS 13.2*  HGB 16.0  HCT 48.0  MCV 90.6  PLT 123456   Basic Metabolic Panel: Recent Labs  Lab 03/25/19 1617  NA 138  K 3.1*  CL 98  CO2 28  GLUCOSE 174*  BUN 13  CREATININE 1.02  CALCIUM 9.0   GFR: Estimated Creatinine Clearance: 86.1 mL/min (by C-G formula based on SCr of 1.02 mg/dL). Liver Function Tests: Recent Labs  Lab 03/25/19 1617  AST 26  ALT 43  ALKPHOS 55  BILITOT 2.1*  PROT 7.8  ALBUMIN 4.0   Recent Labs  Lab 03/25/19 1617  LIPASE 22   No results for input(s): AMMONIA in the last 168 hours. Coagulation Profile: No results for input(s): INR, PROTIME in the last 168 hours. Cardiac Enzymes: No results for input(s): CKTOTAL, CKMB, CKMBINDEX, TROPONINI in the last 168 hours. BNP (last 3 results) No results for input(s): PROBNP in the last 8760 hours. HbA1C: No results for input(s): HGBA1C in the last 72 hours. CBG: Recent Labs  Lab 03/25/19 1619  GLUCAP 162*   Lipid Profile: No results for input(s): CHOL, HDL, LDLCALC, TRIG, CHOLHDL, LDLDIRECT in the last 72 hours. Thyroid Function Tests: No results for input(s): TSH, T4TOTAL, FREET4, T3FREE, THYROIDAB in the last 72 hours. Anemia Panel: No results for input(s): VITAMINB12, FOLATE, FERRITIN, TIBC, IRON, RETICCTPCT in the last 72 hours. Urine analysis:    Component Value Date/Time   COLORURINE YELLOW (A) 03/25/2019 1617   APPEARANCEUR CLEAR (A) 03/25/2019 1617   LABSPEC >1.046 (H) 03/25/2019 1617   PHURINE 5.0 03/25/2019 1617   GLUCOSEU NEGATIVE 03/25/2019 1617   HGBUR NEGATIVE 03/25/2019 [place] 03/25/2019 1617   BILIRUBINUR negative 05/28/2018 1440   KETONESUR  5 (A) 03/25/2019 1617   PROTEINUR NEGATIVE 03/25/2019 1617   UROBILINOGEN 0.2 05/28/2018 1440   NITRITE NEGATIVE 03/25/2019 1617   LEUKOCYTESUR NEGATIVE 03/25/2019 1617    Radiological Exams on Admission: DG Chest 2 View  Result Date: 03/25/2019 CLINICAL DATA:  Shortness of breath, nausea, diarrhea, exposed to [place]; past history diabetes mellitus, hypertension EXAM: CHEST - 2 VIEW COMPARISON:  04/16/2007 FINDINGS: Minimal enlargement of cardiac silhouette. Mediastinal contours and pulmonary vascularity normal. Lungs clear. No pleural effusion or pneumothorax. Scattered endplate spur formation thoracic spine. IMPRESSION: No acute abnormalities. Electronically Signed   By: Lavonia Dana M.D.   On: 03/25/2019 16:16   CT Angio Chest PE W and/or Wo Contrast  Result Date: 03/25/2019 CLINICAL DATA:  Shortness of breath, diarrhea EXAM: CT ANGIOGRAPHY CHEST CT ABDOMEN AND PELVIS WITH CONTRAST TECHNIQUE: Multidetector CT imaging of the chest was performed using the standard protocol during bolus administration of intravenous contrast. Multiplanar CT image reconstructions and MIPs were obtained to evaluate the vascular anatomy. Multidetector CT imaging of the abdomen and pelvis was performed using the standard protocol during bolus administration of intravenous contrast. CONTRAST:  117mL OMNIPAQUE IOHEXOL 350 MG/ML SOLN COMPARISON:  None. FINDINGS: CTA CHEST FINDINGS Cardiovascular: Satisfactory opacification of the pulmonary arteries to the segmental level. No evidence of pulmonary embolism. Normal heart size. No pericardial effusion. Mediastinum/Nodes: No enlarged mediastinal, hilar, or axillary lymph nodes. Thyroid gland, trachea, and esophagus demonstrate no significant findings. Lungs/Pleura: No consolidation or mass. Mild bibasilar atelectasis. No pleural effusion or pneumothorax. Musculoskeletal: No acute abnormality Review of the MIP images confirms the above findings. CT ABDOMEN and PELVIS FINDINGS  Hepatobiliary: No focal liver abnormality is seen. Status post cholecystectomy. No biliary dilatation. Pancreas: Unremarkable. Spleen: Unremarkable. Adrenals/Urinary Tract: Unremarkable. Stomach/Bowel: Minimally distended loops of distal small bowel without evidence of obstruction. Questionable mild small bowel mucosal hyperenhancement. Colon is decompressed. Appendix is not definitely seen. Vascular/Lymphatic: No significant vascular findings are present. No enlarged abdominal or pelvic lymph nodes. Reproductive: Unremarkable Other: Mild right lower quadrant free fluid. Musculoskeletal: No acute abnormality Review of the MIP images confirms the above findings. IMPRESSION: No acute pulmonary embolism.  No evidence of pneumonia. Minimally distended loops of distal small bowel. Questionable mild small bowel mucosal hyperenhancement. Small volume right lower quadrant free fluid. These findings are nonspecific and may reflect enteritis given history. Electronically Signed   By: Macy Mis M.D.   On: 03/25/2019 18:32   CT ABDOMEN PELVIS W CONTRAST  Result Date: 03/25/2019 CLINICAL DATA:  Shortness of breath, diarrhea EXAM: CT ANGIOGRAPHY CHEST CT ABDOMEN AND PELVIS WITH CONTRAST TECHNIQUE: Multidetector CT imaging of the chest was performed using the standard protocol during bolus administration of intravenous contrast. Multiplanar CT image reconstructions and MIPs were obtained to evaluate the vascular anatomy. Multidetector CT imaging of the abdomen and pelvis was performed using the standard protocol during bolus administration of intravenous contrast. CONTRAST:  135mL OMNIPAQUE IOHEXOL 350 MG/ML SOLN COMPARISON:  None. FINDINGS: CTA CHEST FINDINGS Cardiovascular: Satisfactory opacification of the pulmonary arteries to the segmental level. No evidence of pulmonary embolism. Normal heart size. No pericardial effusion. Mediastinum/Nodes: No enlarged mediastinal, hilar, or axillary lymph nodes. Thyroid gland,  trachea, and esophagus demonstrate no significant findings. Lungs/Pleura: No consolidation or mass. Mild bibasilar atelectasis. No pleural effusion or pneumothorax. Musculoskeletal: No acute abnormality Review of the MIP images confirms the above findings. CT ABDOMEN and PELVIS FINDINGS Hepatobiliary: No focal liver abnormality is seen. Status post cholecystectomy. No biliary dilatation. Pancreas: Unremarkable. Spleen: Unremarkable.  Adrenals/Urinary Tract: Unremarkable. Stomach/Bowel: Minimally distended loops of distal small bowel without evidence of obstruction. Questionable mild small bowel mucosal hyperenhancement. Colon is decompressed. Appendix is not definitely seen. Vascular/Lymphatic: No significant vascular findings are present. No enlarged abdominal or pelvic lymph nodes. Reproductive: Unremarkable Other: Mild right lower quadrant free fluid. Musculoskeletal: No acute abnormality Review of the MIP images confirms the above findings. IMPRESSION: No acute pulmonary embolism.  No evidence of pneumonia. Minimally distended loops of distal small bowel. Questionable mild small bowel mucosal hyperenhancement. Small volume right lower quadrant free fluid. These findings are nonspecific and may reflect enteritis given history. Electronically Signed   By: Macy Mis M.D.   On: 03/25/2019 18:32    EKG: Independently reviewed.   Assessment/Plan  Gastroenteritis WBC elevated at 16.1.  Symptoms started about a week and a half ago intermittently after receiving COVID vaccine-unsure if it is related now being now a week out C. difficile testing negative Will give IV Cipro (has PCN allergy) while GI panel is pending chect TSH clear liquid diet for bowel rest  Hypokalemia K of 3.1 replete   HTN Continue amlodipine, Coreg, lisinopril  Type 2 diabetes moderate SSI   Chronic LE edema Continue Lasix   OSA CPAP   GERD  give IV protonix   HLD continue statin   DVT  prophylaxis:.Lovenox Code Status: Full Family Communication: Plan discussed with patient at bedside  disposition Plan: Home with observation Consults called:  Admission status: Observation    T  DO [facility]   If 7PM-7AM, please contact night-coverage [email or web address]   03/26/2019, 12:16 AM

## 2019-03-26 NOTE — ED Notes (Signed)
Attempted to call report

## 2019-03-26 NOTE — Discharge Summary (Signed)
Discharge Summary  James Camacho [id number] DOB: Jun 25, 1950  PCP: Venia Carbon, MD  Admit date: 03/25/2019 Discharge date: 03/26/2019  Time spent: 35 minutes  Recommendations for Outpatient Follow-up:  1. New medication: Cipro 500 p.o. twice daily x4 more days 2. New medication: Flagyl 500 mg p.o. every 8 hours x4 more days  Discharge Diagnoses:  Active Hospital Problems   Diagnosis Date Noted  . Gastroenteritis 03/26/2019  . Hypokalemia 03/26/2019  . GERD (gastroesophageal reflux disease) 02/20/2019  . OSA (obstructive sleep apnea)   . DM (diabetes mellitus) type II uncontrolled, periph vascular disorder ([place]) 01/04/2017  . Dyslipidemia   . Severe obesity (BMI 35.0-39.9) with comorbidity ([place]) 07/12/2011  . Essential hypertension, benign 07/06/2007    Resolved Hospital Problems  No resolved problems to display.    Discharge Condition: Improved, being discharged home  Diet recommendation: Low-sodium, carb modified diet  Vitals:   03/26/19 0131 03/26/19 0200  BP: (!) 157/88   Pulse: 79 81  Resp: 18 16  Temp: 98.1 F (36.7 C)   SpO2: 95% 96%    History of present illness:  69 year old male with past medical history morbid obesity, sleep apnea, type 2 diabetes mellitus and hypertension presented to the emergency room on the afternoon of 1/18 with complaints of 24 hours of diarrhea following receiving the Covid vaccine approximately a week and a half prior.  It resolved, but then he started having diarrhea come back for the past few days continuously.  He described as 4-5 trips to the bathroom every hour.  Had some mild abdominal discomfort and so he came into the emergency room.  In the emergency room, patient was noted to have a white count of 16.1, potassium of 3.1 and normal lactic acid level.  Renal function normal.  CT scan suggestive for enteritis.  C. difficile test negative.  Patient started on IV fluids and IV antibiotics.  He was admitted to the  hospital service on the early morning hours of 1/19.  Hospital Course:  Principal Problem: Gastroenteritis: By afternoon of 1/19, patient is feeling better.  He was started on clear liquid this morning which she has been tolerating well.  White count by early Tuesday morning down to 16.  Patient self is clinically feeling better.  Of note, he did not try Imodium.  Stool cultures are still pending on this patient.  Given his overall clinical improvement, advancing his diet to solid food.  If he tolerates, will discharge home with several more days of Cipro and Flagyl to complete a total of 5-day course.  Active Problems:   Essential hypertension, benign: Stable.  Use home meds.    Severe obesity (BMI 35.0-39.9) with comorbidity [facility]): Patient is criteria BMI greater than 35+ diabetes and hypertension.    Dyslipidemia: Stable continue statin.    DM (diabetes mellitus) type II uncontrolled, periph vascular disorder ([place]): While in hospital, on sliding scale.    OSA (obstructive sleep apnea): Nightly CPAP.    GERD (gastroesophageal reflux disease): Continue PPI    Hypokalemia: Secondary to GI losses.  Replaced.   Procedures:  None  Consultations:  None  Discharge Exam: BP (!) 157/88 (BP Location: Right Arm)   Pulse 81   Temp 98.1 F (36.7 C) (Oral)   Resp 16   Ht 5\' 9"  (1.753 m)   Wt 107.5 kg   SpO2 96%   BMI 35.00 kg/m   General: Alert and oriented x3, no acute distress Cardiovascular: Regular rate and rhythm, S1-S2 Respiratory: Clear  to auscultation bilaterally  Discharge Instructions You were cared for by a hospitalist during your hospital stay. If you have any questions about your discharge medications or the care you received while you were in the hospital after you are discharged, you can call the unit and asked to speak with the hospitalist on call if the hospitalist that took care of you is not available. Once you are discharged, your primary care physician will  handle any further medical issues. Please note that NO REFILLS for any discharge medications will be authorized once you are discharged, as it is imperative that you return to your primary care physician (or establish a relationship with a primary care physician if you do not have one) for your aftercare needs so that they can reassess your need for medications and monitor your lab values.  Discharge Instructions    Diet - low sodium heart healthy   Complete by: As directed    Increase activity slowly   Complete by: As directed      Allergies as of 03/26/2019      Reactions   Aleve [naproxen Sodium] Hives   Other    Pt can only take Tylenol for pain- anything prescribed that he has tried breaks him out in a rash.    Penicillins Rash   Shellfish Allergy Hives      Medication List    TAKE these medications   amLODipine 10 MG tablet Commonly known as: NORVASC TAKE 1 TABLET BY MOUTH EVERY DAY   atorvastatin 10 MG tablet Commonly known as: LIPITOR TAKE 1 TABLET BY MOUTH EVERY DAY   carvedilol 12.5 MG tablet Commonly known as: COREG TAKE 1 TABLET (12.5 MG TOTAL) BY MOUTH 2 (TWO) TIMES DAILY WITH A MEAL.   ciprofloxacin 500 MG tablet Commonly known as: Cipro Take 1 tablet (500 mg total) by mouth 2 (two) times daily for 4 days.   fluticasone 50 MCG/ACT nasal spray Commonly known as: FLONASE Place 2 sprays into both nostrils daily. What changed:   when to take this  reasons to take this   furosemide 40 MG tablet Commonly known as: LASIX Take 1 tablet (40 mg total) by mouth daily.   liraglutide 18 MG/3ML Sopn Commonly known as: VICTOZA Inject 1.8 mg into the skin.   lisinopril 40 MG tablet Commonly known as: ZESTRIL TAKE 1 TABLET BY MOUTH EVERY DAY   metFORMIN 500 MG 24 hr tablet Commonly known as: GLUCOPHAGE-XR TAKE 1 TABLET BY MOUTH EVERY DAY WITH BREAKFAST   metroNIDAZOLE 500 MG tablet Commonly known as: FLAGYL Take 1 tablet (500 mg total) by mouth every 8  (eight) hours.   omeprazole 20 MG capsule Commonly known as: PRILOSEC Take 1 capsule (20 mg total) by mouth daily.   Pen Needles 32G X 6 MM Misc 1 each by Does not apply route daily. Use with Victoza once a day      Allergies  Allergen Reactions  . Aleve [Naproxen Sodium] Hives  . Other     Pt can only take Tylenol for pain- anything prescribed that he has tried breaks him out in a rash.   . Penicillins Rash  . Shellfish Allergy Hives      The results of significant diagnostics from this hospitalization (including imaging, microbiology, ancillary and laboratory) are listed below for reference.    Significant Diagnostic Studies: DG Chest 2 View  Result Date: 03/25/2019 CLINICAL DATA:  Shortness of breath, nausea, diarrhea, exposed to COVID; past history diabetes mellitus, hypertension EXAM:  CHEST - 2 VIEW COMPARISON:  04/16/2007 FINDINGS: Minimal enlargement of cardiac silhouette. Mediastinal contours and pulmonary vascularity normal. Lungs clear. No pleural effusion or pneumothorax. Scattered endplate spur formation thoracic spine. IMPRESSION: No acute abnormalities. Electronically Signed   By: Lavonia Dana M.D.   On: 03/25/2019 16:16   CT Angio Chest PE W and/or Wo Contrast  Result Date: 03/25/2019 CLINICAL DATA:  Shortness of breath, diarrhea EXAM: CT ANGIOGRAPHY CHEST CT ABDOMEN AND PELVIS WITH CONTRAST TECHNIQUE: Multidetector CT imaging of the chest was performed using the standard protocol during bolus administration of intravenous contrast. Multiplanar CT image reconstructions and MIPs were obtained to evaluate the vascular anatomy. Multidetector CT imaging of the abdomen and pelvis was performed using the standard protocol during bolus administration of intravenous contrast. CONTRAST:  14mL OMNIPAQUE IOHEXOL 350 MG/ML SOLN COMPARISON:  None. FINDINGS: CTA CHEST FINDINGS Cardiovascular: Satisfactory opacification of the pulmonary arteries to the segmental level. No evidence of  pulmonary embolism. Normal heart size. No pericardial effusion. Mediastinum/Nodes: No enlarged mediastinal, hilar, or axillary lymph nodes. Thyroid gland, trachea, and esophagus demonstrate no significant findings. Lungs/Pleura: No consolidation or mass. Mild bibasilar atelectasis. No pleural effusion or pneumothorax. Musculoskeletal: No acute abnormality Review of the MIP images confirms the above findings. CT ABDOMEN and PELVIS FINDINGS Hepatobiliary: No focal liver abnormality is seen. Status post cholecystectomy. No biliary dilatation. Pancreas: Unremarkable. Spleen: Unremarkable. Adrenals/Urinary Tract: Unremarkable. Stomach/Bowel: Minimally distended loops of distal small bowel without evidence of obstruction. Questionable mild small bowel mucosal hyperenhancement. Colon is decompressed. Appendix is not definitely seen. Vascular/Lymphatic: No significant vascular findings are present. No enlarged abdominal or pelvic lymph nodes. Reproductive: Unremarkable Other: Mild right lower quadrant free fluid. Musculoskeletal: No acute abnormality Review of the MIP images confirms the above findings. IMPRESSION: No acute pulmonary embolism.  No evidence of pneumonia. Minimally distended loops of distal small bowel. Questionable mild small bowel mucosal hyperenhancement. Small volume right lower quadrant free fluid. These findings are nonspecific and may reflect enteritis given history. Electronically Signed   By: Macy Mis M.D.   On: 03/25/2019 18:32   CT ABDOMEN PELVIS W CONTRAST  Result Date: 03/25/2019 CLINICAL DATA:  Shortness of breath, diarrhea EXAM: CT ANGIOGRAPHY CHEST CT ABDOMEN AND PELVIS WITH CONTRAST TECHNIQUE: Multidetector CT imaging of the chest was performed using the standard protocol during bolus administration of intravenous contrast. Multiplanar CT image reconstructions and MIPs were obtained to evaluate the vascular anatomy. Multidetector CT imaging of the abdomen and pelvis was performed  using the standard protocol during bolus administration of intravenous contrast. CONTRAST:  113mL OMNIPAQUE IOHEXOL 350 MG/ML SOLN COMPARISON:  None. FINDINGS: CTA CHEST FINDINGS Cardiovascular: Satisfactory opacification of the pulmonary arteries to the segmental level. No evidence of pulmonary embolism. Normal heart size. No pericardial effusion. Mediastinum/Nodes: No enlarged mediastinal, hilar, or axillary lymph nodes. Thyroid gland, trachea, and esophagus demonstrate no significant findings. Lungs/Pleura: No consolidation or mass. Mild bibasilar atelectasis. No pleural effusion or pneumothorax. Musculoskeletal: No acute abnormality Review of the MIP images confirms the above findings. CT ABDOMEN and PELVIS FINDINGS Hepatobiliary: No focal liver abnormality is seen. Status post cholecystectomy. No biliary dilatation. Pancreas: Unremarkable. Spleen: Unremarkable. Adrenals/Urinary Tract: Unremarkable. Stomach/Bowel: Minimally distended loops of distal small bowel without evidence of obstruction. Questionable mild small bowel mucosal hyperenhancement. Colon is decompressed. Appendix is not definitely seen. Vascular/Lymphatic: No significant vascular findings are present. No enlarged abdominal or pelvic lymph nodes. Reproductive: Unremarkable Other: Mild right lower quadrant free fluid. Musculoskeletal: No acute abnormality Review of the MIP  images confirms the above findings. IMPRESSION: No acute pulmonary embolism.  No evidence of pneumonia. Minimally distended loops of distal small bowel. Questionable mild small bowel mucosal hyperenhancement. Small volume right lower quadrant free fluid. These findings are nonspecific and may reflect enteritis given history. Electronically Signed   By: Macy Mis M.D.   On: 03/25/2019 18:32    Microbiology: Recent Results (from the past 240 hour(s))  SARS CORONAVIRUS 2 (TAT 6-24 HRS) Nasopharyngeal Nasopharyngeal Swab     Status: None   Collection Time: 03/25/19  5:03  PM   Specimen: Nasopharyngeal Swab  Result Value Ref Range Status   SARS Coronavirus 2 NEGATIVE NEGATIVE Final    Comment: (NOTE) SARS-CoV-2 target nucleic acids are NOT DETECTED. The SARS-CoV-2 RNA is generally detectable in upper and lower respiratory specimens during the acute phase of infection. Negative results do not preclude SARS-CoV-2 infection, do not rule out co-infections with other pathogens, and should not be used as the sole basis for treatment or other patient management decisions. Negative results must be combined with clinical observations, patient history, and epidemiological information. The expected result is Negative. Fact Sheet for Patients: SugarRoll.be Fact Sheet for Healthcare Providers: https://www.woods-mathews.com/ This test is not yet approved or cleared by the [place] FDA and  has been authorized for detection and/or diagnosis of SARS-CoV-2 by FDA under an Emergency Use Authorization (EUA). This EUA will remain  in effect (meaning this test can be used) for the duration of the COVID-19 declaration under Section 56 4(b)(1) of the Act, 21 U.S.C. section 360bbb-3(b)(1), unless the authorization is terminated or revoked sooner. Performed at [facility], [place] [address], [place], [place] [address]   C difficile quick scan w PCR reflex     Status: None   Collection Time: 03/25/19  5:53 PM   Specimen: STOOL  Result Value Ref Range Status   C Diff antigen NEGATIVE NEGATIVE Final   C Diff toxin NEGATIVE NEGATIVE Final   C Diff interpretation No C. difficile detected.  Final    Comment: Performed at [facility], [address] [place], [place] [address]     Labs: Basic Metabolic Panel: Recent Labs  Lab 03/25/19 1617 03/26/19 0424  NA 138 139  K 3.1* 3.7  CL 98 102  CO2 28 28  GLUCOSE 174* 151*  BUN 13 12  CREATININE 1.02 1.01  CALCIUM 9.0 8.7*   Liver Function Tests: Recent Labs    Lab 03/25/19 1617  AST 26  ALT 43  ALKPHOS 55  BILITOT 2.1*  PROT 7.8  ALBUMIN 4.0   Recent Labs  Lab 03/25/19 1617  LIPASE 22   No results for input(s): AMMONIA in the last 168 hours. CBC: Recent Labs  Lab 03/25/19 1617 03/26/19 0424  WBC 16.1* 14.5*  NEUTROABS 13.2*  --   HGB 16.0 14.8  HCT 48.0 46.2  MCV 90.6 93.5  PLT 255 241   Cardiac Enzymes: No results for input(s): CKTOTAL, CKMB, CKMBINDEX, TROPONINI in the last 168 hours. BNP: BNP (last 3 results) No results for input(s): BNP in the last 8760 hours.  ProBNP (last 3 results) No results for input(s): PROBNP in the last 8760 hours.  CBG: Recent Labs  Lab 03/25/19 1619 03/26/19 0827 03/26/19 1129  GLUCAP 162* 134* 183*       Signed:  Annita Brod, MD [facility] 03/26/2019, 3:57 PM

## 2019-03-27 DIAGNOSIS — K529 Noninfective gastroenteritis and colitis, unspecified: Secondary | ICD-10-CM | POA: Diagnosis not present

## 2019-03-27 LAB — GLUCOSE, CAPILLARY
Glucose-Capillary: 159 mg/dL — ABNORMAL HIGH (ref 70–99)
Glucose-Capillary: 248 mg/dL — ABNORMAL HIGH (ref 70–99)

## 2019-03-27 MED ORDER — PANTOPRAZOLE SODIUM 40 MG PO TBEC: 40.0000 mg | DELAYED_RELEASE_TABLET | Freq: Every day | ORAL | Status: DC

## 2019-03-27 MED ORDER — CIPROFLOXACIN HCL 500 MG PO TABS
500.0000 mg | ORAL_TABLET | Freq: Two times a day (BID) | ORAL | Status: DC
  Administered 2019-03-27: 500 mg via ORAL
  Filled 2019-03-27 (×2): qty 1

## 2019-03-27 NOTE — Discharge Summary (Signed)
Discharge Summary  James Camacho [id number] DOB: 04/23/1950  PCP: Venia Carbon, MD  Admit date: 03/25/2019 Discharge date: 03/27/2019  Time spent: 35 minutes  Recommendations for Outpatient Follow-up:  1. New medication: Cipro 500 p.o. twice daily x4 more days 2. New medication: Flagyl 500 mg p.o. every 8 hours x4 more days  Discharge Diagnoses:  Active Hospital Problems   Diagnosis Date Noted  . Gastroenteritis 03/26/2019  . Hypokalemia 03/26/2019  . GERD (gastroesophageal reflux disease) 02/20/2019  . OSA (obstructive sleep apnea)   . DM (diabetes mellitus) type II uncontrolled, periph vascular disorder ([place]) 01/04/2017  . Dyslipidemia   . Severe obesity (BMI 35.0-39.9) with comorbidity ([place]) 07/12/2011  . Essential hypertension, benign 07/06/2007    Resolved Hospital Problems  No resolved problems to display.    Discharge Condition: Improved, being discharged home  Diet recommendation: Low-sodium, carb modified diet  Vitals:   03/26/19 2310 03/27/19 0738  BP: 122/75 134/72  Pulse: 68 63  Resp: 17 17  Temp: 98.8 F (37.1 C) 98.3 F (36.8 C)  SpO2: 97% 97%    History of present illness:  69 year old male with past medical history morbid obesity, sleep apnea, type 2 diabetes mellitus and hypertension presented to the emergency room on the afternoon of 1/18 with complaints of 24 hours of diarrhea following receiving the Covid vaccine approximately a week and a half prior.  It resolved, but then he started having diarrhea come back for the past few days continuously.  He described as 4-5 trips to the bathroom every hour.  Had some mild abdominal discomfort and so he came into the emergency room.  In the emergency room, patient was noted to have a white count of 16.1, potassium of 3.1 and normal lactic acid level.  Renal function normal.  CT scan suggestive for enteritis.  C. difficile test negative.  Patient started on IV fluids and IV antibiotics.  He was  admitted to the hospital service on the early morning hours of 1/19.  Hospital Course:  Principal Problem: Gastroenteritis: By afternoon of 1/19, patient is feeling better.  He was started on clear liquid this morning which she has been tolerating well.  White count by early Tuesday morning down to 16.  Patient self is clinically feeling better.  Of note, he did not try Imodium.  Stool cultures are still pending on this patient.  Given his overall clinical improvement, advancing his diet to solid food.  If he tolerates, will discharge home with several more days of Cipro and Flagyl to complete a total of 5-day course.  Active Problems:   Essential hypertension, benign: Stable.  Use home meds.    Severe obesity (BMI 35.0-39.9) with comorbidity [facility]): Patient is criteria BMI greater than 35+ diabetes and hypertension.    Dyslipidemia: Stable continue statin.    DM (diabetes mellitus) type II uncontrolled, periph vascular disorder ([place]): While in hospital, on sliding scale.    OSA (obstructive sleep apnea): Nightly CPAP.    GERD (gastroesophageal reflux disease): Continue PPI    Hypokalemia: Secondary to GI losses.  Replaced.   Procedures:  None  Consultations:  None  Discharge Exam: BP 134/72 (BP Location: Right Arm)   Pulse 63   Temp 98.3 F (36.8 C) (Oral)   Resp 17   Ht 5\' 9"  (1.753 m)   Wt 107.5 kg   SpO2 97%   BMI 35.00 kg/m   General: Alert and oriented x3, no acute distress Cardiovascular: Regular rate and rhythm, S1-S2 Respiratory:  Clear to auscultation bilaterally  Discharge Instructions You were cared for by a hospitalist during your hospital stay. If you have any questions about your discharge medications or the care you received while you were in the hospital after you are discharged, you can call the unit and asked to speak with the hospitalist on call if the hospitalist that took care of you is not available. Once you are discharged, your primary care  physician will handle any further medical issues. Please note that NO REFILLS for any discharge medications will be authorized once you are discharged, as it is imperative that you return to your primary care physician (or establish a relationship with a primary care physician if you do not have one) for your aftercare needs so that they can reassess your need for medications and monitor your lab values.  Discharge Instructions    Diet - low sodium heart healthy   Complete by: As directed    Discharge instructions   Complete by: As directed    Discharge instructions   Complete by: As directed    It was pleasure taking care of you. Please complete the course of antibiotics as directed. Please follow-up with your primary care physician in 1 to 2 weeks. Keep yourself well-hydrated.   Increase activity slowly   Complete by: As directed    Increase activity slowly   Complete by: As directed      Allergies as of 03/27/2019      Reactions   Aleve [naproxen Sodium] Hives   Other    Pt can only take Tylenol for pain- anything prescribed that he has tried breaks him out in a rash.    Penicillins Rash   Shellfish Allergy Hives      Medication List    TAKE these medications   amLODipine 10 MG tablet Commonly known as: NORVASC TAKE 1 TABLET BY MOUTH EVERY DAY Notes to patient: Last dose given today at 8:48 AM   atorvastatin 10 MG tablet Commonly known as: LIPITOR TAKE 1 TABLET BY MOUTH EVERY DAY Notes to patient: Last dose given today at 5:44 PM   carvedilol 12.5 MG tablet Commonly known as: COREG TAKE 1 TABLET (12.5 MG TOTAL) BY MOUTH 2 (TWO) TIMES DAILY WITH A MEAL. Notes to patient: Last dose given today at 5:44 PM   ciprofloxacin 500 MG tablet Commonly known as: Cipro Take 1 tablet (500 mg total) by mouth 2 (two) times daily for 4 days. Notes to patient: Last dose given today at 12:09 PM   fluticasone 50 MCG/ACT nasal spray Commonly known as: FLONASE Place 2 sprays into  both nostrils daily. What changed:   when to take this  reasons to take this Notes to patient: Not given in hospital   furosemide 40 MG tablet Commonly known as: LASIX Take 1 tablet (40 mg total) by mouth daily. Notes to patient: Last dose given today at 8:48 AM   liraglutide 18 MG/3ML Sopn Commonly known as: VICTOZA Inject 1.8 mg into the skin. Notes to patient: Not given in hospital   lisinopril 40 MG tablet Commonly known as: ZESTRIL TAKE 1 TABLET BY MOUTH EVERY DAY Notes to patient: Last dose given today at 8:48 AM   metFORMIN 500 MG 24 hr tablet Commonly known as: GLUCOPHAGE-XR TAKE 1 TABLET BY MOUTH EVERY DAY WITH BREAKFAST Notes to patient: Not given in hospital   metroNIDAZOLE 500 MG tablet Commonly known as: FLAGYL Take 1 tablet (500 mg total) by mouth every 8 (eight) hours.  Notes to patient: Last dose given today at 5:44 PM   omeprazole 20 MG capsule Commonly known as: PRILOSEC Take 1 capsule (20 mg total) by mouth daily. Notes to patient: Not given in hospital   Pen Needles 32G X 6 MM Misc 1 each by Does not apply route daily. Use with Victoza once a day Notes to patient: Not given in hospital      Allergies  Allergen Reactions  . Aleve [Naproxen Sodium] Hives  . Other     Pt can only take Tylenol for pain- anything prescribed that he has tried breaks him out in a rash.   . Penicillins Rash  . Shellfish Allergy Hives      The results of significant diagnostics from this hospitalization (including imaging, microbiology, ancillary and laboratory) are listed below for reference.    Significant Diagnostic Studies: DG Chest 2 View  Result Date: 03/25/2019 CLINICAL DATA:  Shortness of breath, nausea, diarrhea, exposed to [place]; past history diabetes mellitus, hypertension EXAM: CHEST - 2 VIEW COMPARISON:  04/16/2007 FINDINGS: Minimal enlargement of cardiac silhouette. Mediastinal contours and pulmonary vascularity normal. Lungs clear. No pleural  effusion or pneumothorax. Scattered endplate spur formation thoracic spine. IMPRESSION: No acute abnormalities. Electronically Signed   By: Lavonia Dana M.D.   On: 03/25/2019 16:16   CT Angio Chest PE W and/or Wo Contrast  Result Date: 03/25/2019 CLINICAL DATA:  Shortness of breath, diarrhea EXAM: CT ANGIOGRAPHY CHEST CT ABDOMEN AND PELVIS WITH CONTRAST TECHNIQUE: Multidetector CT imaging of the chest was performed using the standard protocol during bolus administration of intravenous contrast. Multiplanar CT image reconstructions and MIPs were obtained to evaluate the vascular anatomy. Multidetector CT imaging of the abdomen and pelvis was performed using the standard protocol during bolus administration of intravenous contrast. CONTRAST:  131mL OMNIPAQUE IOHEXOL 350 MG/ML SOLN COMPARISON:  None. FINDINGS: CTA CHEST FINDINGS Cardiovascular: Satisfactory opacification of the pulmonary arteries to the segmental level. No evidence of pulmonary embolism. Normal heart size. No pericardial effusion. Mediastinum/Nodes: No enlarged mediastinal, hilar, or axillary lymph nodes. Thyroid gland, trachea, and esophagus demonstrate no significant findings. Lungs/Pleura: No consolidation or mass. Mild bibasilar atelectasis. No pleural effusion or pneumothorax. Musculoskeletal: No acute abnormality Review of the MIP images confirms the above findings. CT ABDOMEN and PELVIS FINDINGS Hepatobiliary: No focal liver abnormality is seen. Status post cholecystectomy. No biliary dilatation. Pancreas: Unremarkable. Spleen: Unremarkable. Adrenals/Urinary Tract: Unremarkable. Stomach/Bowel: Minimally distended loops of distal small bowel without evidence of obstruction. Questionable mild small bowel mucosal hyperenhancement. Colon is decompressed. Appendix is not definitely seen. Vascular/Lymphatic: No significant vascular findings are present. No enlarged abdominal or pelvic lymph nodes. Reproductive: Unremarkable Other: Mild right lower  quadrant free fluid. Musculoskeletal: No acute abnormality Review of the MIP images confirms the above findings. IMPRESSION: No acute pulmonary embolism.  No evidence of pneumonia. Minimally distended loops of distal small bowel. Questionable mild small bowel mucosal hyperenhancement. Small volume right lower quadrant free fluid. These findings are nonspecific and may reflect enteritis given history. Electronically Signed   By: Macy Mis M.D.   On: 03/25/2019 18:32   CT ABDOMEN PELVIS W CONTRAST  Result Date: 03/25/2019 CLINICAL DATA:  Shortness of breath, diarrhea EXAM: CT ANGIOGRAPHY CHEST CT ABDOMEN AND PELVIS WITH CONTRAST TECHNIQUE: Multidetector CT imaging of the chest was performed using the standard protocol during bolus administration of intravenous contrast. Multiplanar CT image reconstructions and MIPs were obtained to evaluate the vascular anatomy. Multidetector CT imaging of the abdomen and pelvis was performed using  the standard protocol during bolus administration of intravenous contrast. CONTRAST:  168mL OMNIPAQUE IOHEXOL 350 MG/ML SOLN COMPARISON:  None. FINDINGS: CTA CHEST FINDINGS Cardiovascular: Satisfactory opacification of the pulmonary arteries to the segmental level. No evidence of pulmonary embolism. Normal heart size. No pericardial effusion. Mediastinum/Nodes: No enlarged mediastinal, hilar, or axillary lymph nodes. Thyroid gland, trachea, and esophagus demonstrate no significant findings. Lungs/Pleura: No consolidation or mass. Mild bibasilar atelectasis. No pleural effusion or pneumothorax. Musculoskeletal: No acute abnormality Review of the MIP images confirms the above findings. CT ABDOMEN and PELVIS FINDINGS Hepatobiliary: No focal liver abnormality is seen. Status post cholecystectomy. No biliary dilatation. Pancreas: Unremarkable. Spleen: Unremarkable. Adrenals/Urinary Tract: Unremarkable. Stomach/Bowel: Minimally distended loops of distal small bowel without evidence of  obstruction. Questionable mild small bowel mucosal hyperenhancement. Colon is decompressed. Appendix is not definitely seen. Vascular/Lymphatic: No significant vascular findings are present. No enlarged abdominal or pelvic lymph nodes. Reproductive: Unremarkable Other: Mild right lower quadrant free fluid. Musculoskeletal: No acute abnormality Review of the MIP images confirms the above findings. IMPRESSION: No acute pulmonary embolism.  No evidence of pneumonia. Minimally distended loops of distal small bowel. Questionable mild small bowel mucosal hyperenhancement. Small volume right lower quadrant free fluid. These findings are nonspecific and may reflect enteritis given history. Electronically Signed   By: Macy Mis M.D.   On: 03/25/2019 18:32    Microbiology: Recent Results (from the past 240 hour(s))  SARS CORONAVIRUS 2 (TAT 6-24 HRS) Nasopharyngeal Nasopharyngeal Swab     Status: None   Collection Time: 03/25/19  5:03 PM   Specimen: Nasopharyngeal Swab  Result Value Ref Range Status   SARS Coronavirus 2 NEGATIVE NEGATIVE Final    Comment: (NOTE) SARS-CoV-2 target nucleic acids are NOT DETECTED. The SARS-CoV-2 RNA is generally detectable in upper and lower respiratory specimens during the acute phase of infection. Negative results do not preclude SARS-CoV-2 infection, do not rule out co-infections with other pathogens, and should not be used as the sole basis for treatment or other patient management decisions. Negative results must be combined with clinical observations, patient history, and epidemiological information. The expected result is Negative. Fact Sheet for Patients: SugarRoll.be Fact Sheet for Healthcare Providers: https://www.woods-mathews.com/ This test is not yet approved or cleared by the [place] FDA and  has been authorized for detection and/or diagnosis of SARS-CoV-2 by FDA under an Emergency Use Authorization (EUA).  This EUA will remain  in effect (meaning this test can be used) for the duration of the COVID-19 declaration under Section 56 4(b)(1) of the Act, 21 U.S.C. section 360bbb-3(b)(1), unless the authorization is terminated or revoked sooner. Performed at [facility], [place] [address], [place], [place] [address]   C difficile quick scan w PCR reflex     Status: None   Collection Time: 03/25/19  5:53 PM   Specimen: STOOL  Result Value Ref Range Status   C Diff antigen NEGATIVE NEGATIVE Final   C Diff toxin NEGATIVE NEGATIVE Final   C Diff interpretation No C. difficile detected.  Final    Comment: Performed at [facility], [address] [place], [place] [address]     Labs: Basic Metabolic Panel: Recent Labs  Lab 03/25/19 1617 03/26/19 0424  NA 138 139  K 3.1* 3.7  CL 98 102  CO2 28 28  GLUCOSE 174* 151*  BUN 13 12  CREATININE 1.02 1.01  CALCIUM 9.0 8.7*   Liver Function Tests: Recent Labs  Lab 03/25/19 1617  AST 26  ALT 43  ALKPHOS 55  BILITOT 2.1*  PROT 7.8  ALBUMIN 4.0   Recent Labs  Lab 03/25/19 1617  LIPASE 22   No results for input(s): AMMONIA in the last 168 hours. CBC: Recent Labs  Lab 03/25/19 1617 03/26/19 0424  WBC 16.1* 14.5*  NEUTROABS 13.2*  --   HGB 16.0 14.8  HCT 48.0 46.2  MCV 90.6 93.5  PLT 255 241   Cardiac Enzymes: No results for input(s): CKTOTAL, CKMB, CKMBINDEX, TROPONINI in the last 168 hours. BNP: BNP (last 3 results) No results for input(s): BNP in the last 8760 hours.  ProBNP (last 3 results) No results for input(s): PROBNP in the last 8760 hours.  CBG: Recent Labs  Lab 03/26/19 1129 03/26/19 1654 03/26/19 2056 03/27/19 0737 03/27/19 1129  GLUCAP 183* 111* 178* 159* 248*    Signed:  Lorella Nimrod, MD [facility] 03/27/2019, 12:40 PM

## 2019-03-27 NOTE — TOC Transition Note (Signed)
Transition of Care [facility]) - CM/SW Discharge Note   Patient Details  Name: James Camacho MRN: [id number] Date of Birth: Apr 09, 1950  [facility]) CM/SW Contact:  Su Hilt, RN Phone Number: 03/27/2019, 9:54 AM   Clinical Narrative:     Talked to the patient via telephone due to contact precautions, Reviewed the MOON.  The patient is independent at home and has no needs at this time       Patient Goals and CMS Choice        Discharge Placement                       Discharge Plan and Services                                     Social Determinants of Health (SDOH) Interventions     Readmission Risk Interventions No flowsheet data found.

## 2019-03-27 NOTE — Progress Notes (Signed)
PHARMACIST - PHYSICIAN COMMUNICATION DR:  [place]: Antibiotic IV to Oral Route Change Policy  RECOMMENDATION: This patient is receiving ciprofloxacin by the intravenous route.  Based on criteria approved by the Pharmacy and Therapeutics Committee, the antibiotic(s) is/are being converted to the equivalent oral dose form(s).   DESCRIPTION: These criteria include:  Patient being treated for a respiratory tract infection, urinary tract infection, cellulitis or clostridium difficile associated diarrhea if on metronidazole  The patient is not neutropenic and does not exhibit a GI malabsorption state  The patient is eating (either orally or via tube) and/or has been taking other orally administered medications for a least 24 hours  The patient is improving clinically and has a Tmax < 100.5  If you have questions about this conversion, please contact the [place], PharmD, BCPS Clinical Pharmacist 03/27/2019 10:57 AM

## 2019-03-27 NOTE — Progress Notes (Signed)
PHARMACIST - PHYSICIAN COMMUNICATION  DR:   [place]: IV to Oral Route Change Policy  RECOMMENDATION: This patient is receiving pantoprazole by the intravenous route.  Based on criteria approved by the Pharmacy and Therapeutics Committee, the intravenous medication(s) is/are being converted to the equivalent oral dose form(s).   DESCRIPTION: These criteria include:  The patient is eating (either orally or via tube) and/or has been taking other orally administered medications for a least 24 hours  The patient has no evidence of active gastrointestinal bleeding or impaired GI absorption (gastrectomy, short bowel, patient on TNA or NPO).  If you have questions about this conversion, please contact the Pharmacy Department  []   [phone] )  [facility] [x]   [phone] )  [facility] []   [phone] )  [facility] []   [phone] )  [facility] []   [phone] )  [place], PharmD, BCPS Clinical Pharmacist 03/27/2019 11:01 AM

## 2019-03-27 NOTE — Care Management Obs Status (Signed)
[place] NOTIFICATION   Patient Details  Name: James Camacho MRN: [id number] Date of Birth: 04-18-1950   Medicare Observation Status Notification Given:  Yes    Su Hilt, RN 03/27/2019, 9:54 AM

## 2019-03-28 LAB — GI PATHOGEN PANEL BY PCR, STOOL

## 2019-04-08 MED ORDER — OMEPRAZOLE 20 MG PO CPDR: 20.0000 mg | DELAYED_RELEASE_CAPSULE | Freq: Two times a day (BID) | ORAL | 3 refills | Status: DC

## 2019-04-12 MED ORDER — OMEPRAZOLE 20 MG PO CPDR: 20.0000 mg | DELAYED_RELEASE_CAPSULE | Freq: Two times a day (BID) | ORAL | 3 refills | Status: DC

## 2019-04-12 NOTE — Addendum Note (Signed)
Addended by: Pilar Grammes on: 04/12/2019 04:48 PM   Modules accepted: Orders

## 2019-04-17 ENCOUNTER — Other Ambulatory Visit: Payer: Self-pay

## 2019-04-18 ENCOUNTER — Encounter: Payer: Self-pay | Admitting: Internal Medicine

## 2019-04-18 ENCOUNTER — Ambulatory Visit: Payer: Medicare HMO | Admitting: Internal Medicine

## 2019-04-18 VITALS — BP 120/60 | HR 78 | Ht 68.0 in | Wt 246.0 lb

## 2019-04-18 DIAGNOSIS — E1151 Type 2 diabetes mellitus with diabetic peripheral angiopathy without gangrene: Secondary | ICD-10-CM | POA: Diagnosis not present

## 2019-04-18 DIAGNOSIS — E1165 Type 2 diabetes mellitus with hyperglycemia: Secondary | ICD-10-CM | POA: Diagnosis not present

## 2019-04-18 DIAGNOSIS — IMO0002 Reserved for concepts with insufficient information to code with codable children: Secondary | ICD-10-CM

## 2019-04-18 LAB — POCT GLYCOSYLATED HEMOGLOBIN (HGB A1C): Hemoglobin A1C: 8.4 % — AB (ref 4.0–5.6)

## 2019-04-18 MED ORDER — OZEMPIC (0.25 OR 0.5 MG/DOSE) 2 MG/1.5ML ~~LOC~~ SOPN: 0.5000 mg | PEN_INJECTOR | SUBCUTANEOUS | 3 refills | Status: DC

## 2019-04-18 MED ORDER — METFORMIN HCL ER 500 MG PO TB24: ORAL_TABLET | ORAL | 3 refills | Status: DC

## 2019-04-18 NOTE — Addendum Note (Signed)
Addended by: Cardell Peach I on: 04/18/2019 02:58 PM   Modules accepted: Orders

## 2019-04-18 NOTE — Patient Instructions (Addendum)
Please Increase: - Metformin gradually to 1000 mg 2x a day, with meals  Please try to switch to: - Ozempic 0.5 mg weekly, however, if not covered, increase Victoza to 1.8 mg in a.m.  Please schedule an appt with Antonieta Iba with nutrition.  Please return in 3 months with your sugar log.   PATIENT INSTRUCTIONS FOR TYPE 2 DIABETES:  DIET AND EXERCISE Diet and exercise is an important part of diabetic treatment.  We recommended aerobic exercise in the form of brisk walking (working between 40-60% of maximal aerobic capacity, similar to brisk walking) for 150 minutes per week (such as 30 minutes five days per week) along with 3 times per week performing 'resistance' training (using various gauge rubber tubes with handles) 5-10 exercises involving the major muscle groups (upper body, lower body and core) performing 10-15 repetitions (or near fatigue) each exercise. Start at half the above goal but build slowly to reach the above goals. If limited by weight, joint pain, or disability, we recommend daily walking in a swimming pool with water up to waist to reduce pressure from joints while allow for adequate exercise.    BLOOD GLUCOSES Monitoring your blood glucoses is important for continued management of your diabetes. Please check your blood glucoses 2-4 times a day: fasting, before meals and at bedtime (you can rotate these measurements - e.g. one day check before the 3 meals, the next day check before 2 of the meals and before bedtime, etc.).   HYPOGLYCEMIA (low blood sugar) Hypoglycemia is usually a reaction to not eating, exercising, or taking too much insulin/ other diabetes drugs.  Symptoms include tremors, sweating, hunger, confusion, headache, etc. Treat IMMEDIATELY with 15 grams of Carbs: . 4 glucose tablets .  cup regular juice/soda . 2 tablespoons raisins . 4 teaspoons sugar . 1 tablespoon honey Recheck blood glucose in 15 mins and repeat above if still symptomatic/blood glucose  <100.  RECOMMENDATIONS TO REDUCE YOUR RISK OF DIABETIC COMPLICATIONS: * Take your prescribed MEDICATION(S) * Follow a DIABETIC diet: Complex carbs, fiber rich foods, (monounsaturated and polyunsaturated) fats * AVOID saturated/trans fats, high fat foods, >2,300 mg salt per day. * EXERCISE at least 5 times a week for 30 minutes or preferably daily.  * DO NOT SMOKE OR DRINK more than 1 drink a day. * Check your FEET every day. Do not wear tightfitting shoes. Contact us if you develop an ulcer * See your EYE doctor once a year or more if needed * Get a FLU shot once a year * Get a PNEUMONIA vaccine once before and once after age 36 years  GOALS:  * Your Hemoglobin A1c of <7%  * fasting sugars need to be <130 * after meals sugars need to be <180 (2h after you start eating) * Your Systolic BP should be [id number] or lower  * Your Diastolic BP should be 80 or lower  * Your HDL (Good Cholesterol) should be 40 or higher  * Your LDL (Bad Cholesterol) should be 100 or lower. * Your Triglycerides should be 150 or lower  * Your Urine microalbumin (kidney function) should be <30 * Your Body Mass Index should be 25 or lower    Please consider the following ways to cut down carbs and fat and increase fiber and micronutrients in your diet: - substitute whole grain for white bread or pasta - substitute brown rice for white rice - substitute 90-calorie flat bread pieces for slices of bread when possible - substitute sweet potatoes or  yams for white potatoes - substitute humus for margarine - substitute tofu for cheese when possible - substitute almond or rice milk for regular milk (would not drink soy milk daily due to concern for soy estrogen influence on breast cancer risk) - substitute dark chocolate for other sweets when possible - substitute water - can add lemon or orange slices for taste - for diet sodas (artificial sweeteners will trick your body that you can eat sweets without getting calories and  will lead you to overeating and weight gain in the long run) - do not skip breakfast or other meals (this will slow down the metabolism and will result in more weight gain over time)  - can try smoothies made from fruit and almond/rice milk in am instead of regular breakfast - can also try old-fashioned (not instant) oatmeal made with almond/rice milk in am - order the dressing on the side when eating salad at a restaurant (pour less than half of the dressing on the salad) - eat as little meat as possible - can try juicing, but should not forget that juicing will get rid of the fiber, so would alternate with eating raw veg./fruits or drinking smoothies - use as little oil as possible, even when using olive oil - can dress a salad with a mix of balsamic vinegar and lemon juice, for e.g. - use agave nectar, stevia sugar, or regular sugar rather than artificial sweateners - steam or broil/roast veggies  - snack on veggies/fruit/nuts (unsalted, preferably) when possible, rather than processed foods - reduce or eliminate aspartame in diet (it is in diet sodas, chewing gum, etc) Read the labels!  Try to read Dr. Janene Harvey book: "Program for Reversing Diabetes" for other ideas for healthy eating.

## 2019-04-18 NOTE — Progress Notes (Signed)
Patient ID: James Camacho, male   DOB: Sep 30, 1950, 69 y.o.   MRN: [id number]   This visit occurred during the SARS-CoV-2 public health emergency.  Safety protocols were in place, including screening questions prior to the visit, additional usage of staff PPE, and extensive cleaning of exam room while observing appropriate contact time as indicated for disinfecting solutions.   HPI: James Camacho is a 69 y.o.-year-old male, referred by his PCP, Dr. Silvio Pate, for management of DM2, dx in , non-insulin-dependent, uncontrolled, with long-term complications (peripheral vascular disease).  Reviewed A1c levels: Lab Results  Component Value Date   HGBA1C 8.9 (H) 02/20/2019   HGBA1C 7.3 (A) 08/29/2018   HGBA1C 11.0 (A) 05/28/2018   HGBA1C 7.5 (H) 06/20/2017   Pt is on a regimen of: - Metformin ER 500 mg with b'fast (lower dose due to previous low blood sugars) - Victoza ~1.6 mg in a.m. Previously on glipizide, but stopped because of low blood sugars.  Pt checks his sugars 0-2 a day and they are: - am: 125-175 - 2h after b'fast: n/c - before lunch: n/c - 2h after lunch: n/c - before dinner: 150-250, 275 - 2h after dinner: n/c - bedtime: n/c - nighttime: n/c Lowest sugar was 50 (on Glipizide); he has hypoglycemia awareness at80.  Highest sugar was 350 (wedding reception), 250.  Glucometer: GE  Pt's meals are: - Breakfast: may skip; banana + apple; sausage biscuit + diet coke  - Lunch: grilled chicken + coleslaw, green beans; salads - Dinner: scrambled eggs, cereals  - Snacks: diet drinks, occasional snacks He spends a lot of time driving, on the road.  - no CKD, last BUN/creatinine:  Lab Results  Component Value Date   BUN 12 03/26/2019   BUN 13 03/25/2019   CREATININE 1.01 03/26/2019   CREATININE 1.02 03/25/2019  On lisinopril 40.  -+ HL; last set of lipids: Lab Results  Component Value Date   CHOL 121 02/20/2019   HDL 38.80 (L) 02/20/2019   LDLCALC 61 02/20/2019   TRIG 102.0 02/20/2019   CHOLHDL 3 02/20/2019  On Lipitor 10.  - last eye exam was in Fall 2020: No DR. + cataract sx 2019. He has Fuchs dystrophy.   - no numbness and tingling in his feet.  Pt has FH of DM in M.  + h/o HTN and GERD.  ROS: Constitutional: no weight gain, no weight loss, + fatigue, no subjective hyperthermia, no subjective hypothermia, no nocturia Eyes: no blurry vision, no xerophthalmia ENT: no sore throat, no nodules palpated in neck, no dysphagia, no odynophagia, no hoarseness, no tinnitus, no hypoacusis Cardiovascular: no CP, no SOB, no palpitations, + leg swelling Respiratory: no cough, no SOB, no wheezing Gastrointestinal: no N, no V, no D, no C, + acid reflux Musculoskeletal: no muscle, no joint aches Skin: no rash, no hair loss Neurological: no tremors, no numbness or tingling/no dizziness/no HAs Psychiatric: no depression, + anxiety  Past Medical History:  Diagnosis Date  . Anal condylomata   . Anxiety   . Depression   . ED (erectile dysfunction)   . History of adenomatous polyp of colon   . History of exercise stress test    ETT 04-17-2007 (in epic)  negative  . Hyperlipidemia    LDL in 120's  . Hypertension    followed by pcp  . OSA (obstructive sleep apnea)   . OSA on CPAP    study in epic 08-11-2017 moderate osa  . Seasonal allergic rhinitis   . Type 2  diabetes mellitus ([place])    followed by pcp  . Wears glasses   . Wears hearing aid in both ears    Past Surgical History:  Procedure Laterality Date  . CATARACT EXTRACTION W/PHACO Right 06/28/2017   Procedure: CATARACT EXTRACTION PHACO AND INTRAOCULAR LENS PLACEMENT ([place]) RIGHT;  Surgeon: Leandrew Koyanagi, MD;  Location: [place];  Service: Ophthalmology;  Laterality: Right;  . CATARACT EXTRACTION W/PHACO Left 07/19/2017   Procedure: CATARACT EXTRACTION PHACO AND INTRAOCULAR LENS PLACEMENT ([place]) LEFT TOPICAL;  Surgeon: Leandrew Koyanagi, MD;  Location: [place];   Service: Ophthalmology;  Laterality: Left;  IVA TOPICALLEFTDIABETIC  . CHOLECYSTECTOMY OPEN  1980s   dr  Sharlet Salina   AND APPENDECTOMY  . COLONOSCOPY WITH PROPOFOL  last one 03-28-2018   dr pyrtle  . LASER ABLATION CONDOLAMATA N/A 12/20/2018   Procedure: LASER ABLATION OF CONDOLAMATA, EXAMINATION UNDER ANESTHESIA;  Surgeon: Michael Boston, MD;  Location: [place];  Service: General;  Laterality: N/A;   Social History   Socioeconomic History  . Marital status: Married    Spouse name: Not on file  . Number of children: 3  . Years of education: Not on file  . Highest education level: Not on file  Occupational History  . 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 Occupation: Engineer, site business    Comment:    Tobacco Use  . Smoking status: Never Smoker  . Smokeless tobacco: Never Used  Substance and Sexual Activity  . Alcohol use: Not Currently    Comment: rare  . Drug use: Never  . Sexual activity: Not on file  Other Topics Concern  . Not on file  Social History Narrative   Has living will   Wife is health care POA--- children are alternates   Would accept resuscitation   No prolonged tube feeds   Social Determinants of Health   Financial Resource Strain:   . Difficulty of Paying Living Expenses: Not on file  Food Insecurity:   . Worried About Charity fundraiser in the Last Year: Not on file  . Ran Out of Food in the Last Year: Not on file  Transportation Needs:   . Lack of Transportation (Medical): Not on file  . Lack of Transportation (Non-Medical): Not on file  Physical Activity:   . Days of Exercise per Week: Not on file  . Minutes of Exercise per Session: Not on file  Stress:   . Feeling of Stress : Not on file  Social Connections:   . Frequency of Communication with Friends and Family: Not on file  . Frequency of Social Gatherings with Friends and Family: Not on file  . Attends  Religious Services: Not on file  . Active Member of Clubs or Organizations: Not on file  . Attends Archivist Meetings: Not on file  . Marital Status: Not on file  Intimate Partner Violence:   . Fear of Current or Ex-Partner: Not on file  . Emotionally Abused: Not on file  . Physically Abused: Not on file  . Sexually Abused: Not on file   Current Outpatient Medications on File Prior to Visit  Medication Sig Dispense Refill  . amLODipine (NORVASC) 10 MG tablet TAKE 1 TABLET BY MOUTH EVERY DAY (Patient taking differently: Take 10 mg by mouth daily. ) 90 tablet 3  . atorvastatin (LIPITOR) 10 MG tablet TAKE 1 TABLET BY MOUTH EVERY DAY 90 tablet 3  .  carvedilol (COREG) 12.5 MG tablet TAKE 1 TABLET (12.5 MG TOTAL) BY MOUTH 2 (TWO) TIMES DAILY WITH A MEAL. 180 tablet 3  . fluticasone (FLONASE) 50 MCG/ACT nasal spray Place 2 sprays into both nostrils daily. (Patient taking differently: Place 2 sprays into both nostrils as needed. ) 16 g 11  . furosemide (LASIX) 40 MG tablet Take 1 tablet (40 mg total) by mouth daily. (Patient taking differently: Take 40 mg by mouth daily. ) 90 tablet 0  . Insulin Pen Needle (PEN NEEDLES) 32G X 6 MM MISC 1 each by Does not apply route daily. Use with Victoza once a day 50 each 11  . liraglutide (VICTOZA) 18 MG/3ML SOPN Inject 1.8 mg into the skin.    Marland Kitchen lisinopril (ZESTRIL) 40 MG tablet TAKE 1 TABLET BY MOUTH EVERY DAY (Patient taking differently: Take 40 mg by mouth daily. ) 90 tablet 3  . metFORMIN (GLUCOPHAGE-XR) 500 MG 24 hr tablet TAKE 1 TABLET BY MOUTH EVERY DAY WITH BREAKFAST 90 tablet 3  . metroNIDAZOLE (FLAGYL) 500 MG tablet Take 1 tablet (500 mg total) by mouth every 8 (eight) hours. 14 tablet 0  . omeprazole (PRILOSEC) 20 MG capsule Take 1 capsule (20 mg total) by mouth 2 (two) times daily before a meal. 180 capsule 3   No current facility-administered medications on file prior to visit.   Allergies  Allergen Reactions  . Aleve [Naproxen  Sodium] Hives  . Other     Pt can only take Tylenol for pain- anything prescribed that he has tried breaks him out in a rash.   . Penicillins Rash  . Shellfish Allergy Hives   Family History  Problem Relation Age of Onset  . Diabetes Mother   . Arthritis Mother   . Hypertension Mother   . Chronic Renal Failure Mother   . Parkinsonism Father   . Heart disease Father   . Healthy Sister   . Healthy Brother   . Healthy Sister   . Cancer Neg Hx        no colon or prostate cancer  . Colon cancer Neg Hx   . Esophageal cancer Neg Hx   . Stomach cancer Neg Hx   . Rectal cancer Neg Hx     PE: BP 120/60   Pulse 78   Ht 5\' 8"  (1.727 m)   Wt 246 lb (111.6 kg)   SpO2 97%   BMI 37.40 kg/m  Wt Readings from Last 3 Encounters:  04/18/19 246 lb (111.6 kg)  03/25/19 237 lb (107.5 kg)  02/20/19 250 lb (113.4 kg)   Constitutional: overweight, in NAD Eyes: PERRLA, EOMI, no exophthalmos ENT: moist mucous membranes, no thyromegaly, no cervical lymphadenopathy Cardiovascular: RRR, No MRG Respiratory: CTA B Gastrointestinal: abdomen soft, NT, ND, BS+ Musculoskeletal: no deformities, strength intact in all 4 Skin: moist, warm, no rashes Neurological: no tremor with outstretched hands, DTR normal in all 4  ASSESSMENT: 1. DM2, non-insulin-dependent, uncontrolled, with long-term complications - PVD - ED  PLAN:  1. Patient with long-standing, uncontrolled diabetes, on oral antidiabetic regimen, which became insufficient.  Latest HbA1c was higher, at 8.9%.  HbA1c checked today: 8.4% (better). -He did lose 13 pounds after his HbA1c returned high in 02/2019, however, he gained 9 pounds -At this visit, we discussed about the concept of insulin resistance and I explained the importance of reducing fatty foods and concentrated sweets, reducing eating out and taking his meals with him on the road.  He has a poor diet (last  night he had scrambled eggs with sausage and then had cereal flakes with  milk, and his sugars in the morning was in the 200s.  I explained why this is an unacceptable dinner.  He does feel that he is eating his dinner is late and this may be a reason for the high blood sugars in the morning.  He does accept a referral to nutrition at this visit -We also discussed about increasing Metformin ER, since he can tolerate this well, and we will also try to switch from Victoza to [place].  I advised him that if he cannot switch to Ozempic due to price, he needs to increase Victoza to 1.8 mg daily, in a.m.  The above measures along with changing his diet will most likely help, but at next visit, if sugars are not much improved, we will need to add an SGLT2 inhibitor. - I suggested to:  Patient Instructions  Please Increase: - Metformin gradually to 1000 mg 2x a day, with meals  Please try to switch to: - Ozempic 0.5 mg weekly, however, if not covered, increase Victoza to 1.8 mg in a.m.  Please schedule an appt with Antonieta Iba with nutrition.  Please return in 3 months with your sugar log.   - Strongly advised him to start checking sugars at different times of the day - check 1-2x a day, rotating checks - discussed about CBG targets for treatment: 80-130 mg/dL before meals and <180 mg/dL after meals; target HbA1c <7%. - given sugar log and advised how to fill it and to bring it at next appt  - given foot care handout and explained the principles  - given instructions for hypoglycemia management "15-15 rule"  - advised for yearly eye exams  - Return to clinic in 3 mo with sugar log   Philemon Kingdom, MD PhD [facility]

## 2019-04-30 DIAGNOSIS — G4733 Obstructive sleep apnea (adult) (pediatric): Secondary | ICD-10-CM | POA: Diagnosis not present

## 2019-05-18 ENCOUNTER — Other Ambulatory Visit: Payer: Self-pay | Admitting: Internal Medicine

## 2019-05-23 ENCOUNTER — Ambulatory Visit: Payer: Medicare HMO | Admitting: Dietician

## 2019-05-31 DIAGNOSIS — G4733 Obstructive sleep apnea (adult) (pediatric): Secondary | ICD-10-CM | POA: Diagnosis not present

## 2019-06-14 ENCOUNTER — Encounter: Payer: Self-pay | Admitting: Internal Medicine

## 2019-06-14 ENCOUNTER — Ambulatory Visit (INDEPENDENT_AMBULATORY_CARE_PROVIDER_SITE_OTHER): Payer: Medicare HMO | Admitting: Internal Medicine

## 2019-06-14 ENCOUNTER — Other Ambulatory Visit: Payer: Self-pay

## 2019-06-14 VITALS — BP 142/76 | HR 76 | Temp 98.1°F | Wt 251.0 lb

## 2019-06-14 DIAGNOSIS — M5410 Radiculopathy, site unspecified: Secondary | ICD-10-CM | POA: Insufficient documentation

## 2019-06-14 DIAGNOSIS — M5416 Radiculopathy, lumbar region: Secondary | ICD-10-CM | POA: Diagnosis not present

## 2019-06-14 NOTE — Assessment & Plan Note (Signed)
Symptoms really suggest sciatica or lumbar based radicular symptoms No hip bursitis or sig arthritic symptoms Did have sensory symptoms and brief weakness yesterday (though no clear findings today) Nothing to suggest sig disc disease at this point Discussed options--will proceed with physiatry evaluation

## 2019-06-14 NOTE — Progress Notes (Signed)
Subjective:    Patient ID: James Camacho, male    DOB: 1950/06/22, 68 y.o.   MRN: [id number]  HPI Here due to hip pain This visit occurred during the SARS-CoV-2 public health emergency.  Safety protocols were in place, including screening questions prior to the visit, additional usage of staff PPE, and extensive cleaning of exam room while observing appropriate contact time as indicated for disinfecting solutions.   Has had a problems when driving Right leg hurts and has progressively worsened Starts in hip and goes down leg to foot Chiropractor for a dozen treatments--hasn't helped Has needed tylenol 2000mg  bid just to be able to drive  Yesterday, calf of leg started going numb Right leg gave out also  No pain in back--just posterior upper hip Tried cushion in seat--no help  Current Outpatient Medications on File Prior to Visit  Medication Sig Dispense Refill  . amLODipine (NORVASC) 10 MG tablet TAKE 1 TABLET BY MOUTH EVERY DAY (Patient taking differently: Take 10 mg by mouth daily. ) 90 tablet 3  . atorvastatin (LIPITOR) 10 MG tablet TAKE 1 TABLET BY MOUTH EVERY DAY 90 tablet 3  . carvedilol (COREG) 12.5 MG tablet TAKE 1 TABLET (12.5 MG TOTAL) BY MOUTH 2 (TWO) TIMES DAILY WITH A MEAL. 180 tablet 3  . fluticasone (FLONASE) 50 MCG/ACT nasal spray Place 2 sprays into both nostrils daily. (Patient taking differently: Place 2 sprays into both nostrils as needed. ) 16 g 11  . furosemide (LASIX) 40 MG tablet Take 1 tablet (40 mg total) by mouth daily. (Patient taking differently: Take 40 mg by mouth daily. ) 90 tablet 0  . Insulin Pen Needle (PEN NEEDLES) 32G X 6 MM MISC 1 each by Does not apply route daily. Use with Victoza once a day 50 each 11  . lisinopril (ZESTRIL) 40 MG tablet TAKE 1 TABLET BY MOUTH EVERY DAY (Patient taking differently: Take 40 mg by mouth daily. ) 90 tablet 3  . metFORMIN (GLUCOPHAGE-XR) 500 MG 24 hr tablet TAKE 2 TABLET BY MOUTH EVERY DAY 2x a day 360 tablet 3    . metroNIDAZOLE (FLAGYL) 500 MG tablet Take 1 tablet (500 mg total) by mouth every 8 (eight) hours. 14 tablet 0  . omeprazole (PRILOSEC) 20 MG capsule Take 1 capsule (20 mg total) by mouth 2 (two) times daily before a meal. 180 capsule 3  . Semaglutide,0.25 or 0.5MG /DOS, (OZEMPIC, 0.25 OR 0.5 MG/DOSE,) 2 MG/1.5ML SOPN Inject 0.5 mg into the skin once a week. 2 pen 3   No current facility-administered medications on file prior to visit.    Allergies  Allergen Reactions  . Aleve [Naproxen Sodium] Hives  . Other     Pt can only take Tylenol for pain- anything prescribed that he has tried breaks him out in a rash.   . Penicillins Rash  . Shellfish Allergy Hives    Past Medical History:  Diagnosis Date  . Anal condylomata   . Anxiety   . Depression   . ED (erectile dysfunction)   . History of adenomatous polyp of colon   . History of exercise stress test    ETT 04-17-2007 (in epic)  negative  . Hyperlipidemia    LDL in 120's  . Hypertension    followed by pcp  . OSA (obstructive sleep apnea)   . OSA on CPAP    study in epic 08-11-2017 moderate osa  . Seasonal allergic rhinitis   . Type 2 diabetes mellitus ([place])  followed by pcp  . Wears glasses   . Wears hearing aid in both ears     Past Surgical History:  Procedure Laterality Date  . CATARACT EXTRACTION W/PHACO Right 06/28/2017   Procedure: CATARACT EXTRACTION PHACO AND INTRAOCULAR LENS PLACEMENT ([place]) RIGHT;  Surgeon: Leandrew Koyanagi, MD;  Location: [place];  Service: Ophthalmology;  Laterality: Right;  . CATARACT EXTRACTION W/PHACO Left 07/19/2017   Procedure: CATARACT EXTRACTION PHACO AND INTRAOCULAR LENS PLACEMENT ([place]) LEFT TOPICAL;  Surgeon: Leandrew Koyanagi, MD;  Location: [place];  Service: Ophthalmology;  Laterality: Left;  IVA TOPICALLEFTDIABETIC  . CHOLECYSTECTOMY OPEN  1980s   dr  Sharlet Salina   AND APPENDECTOMY  . COLONOSCOPY WITH PROPOFOL  last one 03-28-2018   dr pyrtle  .  LASER ABLATION CONDOLAMATA N/A 12/20/2018   Procedure: LASER ABLATION OF CONDOLAMATA, EXAMINATION UNDER ANESTHESIA;  Surgeon: Michael Boston, MD;  Location: [place];  Service: General;  Laterality: N/A;    Family History  Problem Relation Age of Onset  . Diabetes Mother   . Arthritis Mother   . Hypertension Mother   . Chronic Renal Failure Mother   . Parkinsonism Father   . Heart disease Father   . Healthy Sister   . Healthy Brother   . Healthy Sister   . Cancer Neg Hx        no colon or prostate cancer  . Colon cancer Neg Hx   . Esophageal cancer Neg Hx   . Stomach cancer Neg Hx   . Rectal cancer Neg Hx     Social History   Socioeconomic History  . Marital status: Married    Spouse name: Not on file  . Number of children: 3  . Years of education: Not on file  . Highest education level: Not on file  Occupational History  . 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 Occupation: Engineer, site business    Comment:    Tobacco Use  . Smoking status: Never Smoker  . Smokeless tobacco: Never Used  Substance and Sexual Activity  . Alcohol use: Not Currently    Comment: rare  . Drug use: Never  . Sexual activity: Not on file  Other Topics Concern  . Not on file  Social History Narrative   Has living will   Wife is health care POA--- children are alternates   Would accept resuscitation   No prolonged tube feeds   Social Determinants of Health   Financial Resource Strain:   . Difficulty of Paying Living Expenses:   Food Insecurity:   . Worried About Charity fundraiser in the Last Year:   . Arboriculturist in the Last Year:   Transportation Needs:   . Film/video editor (Medical):   Marland Kitchen Lack of Transportation (Non-Medical):   Physical Activity:   . Days of Exercise per Week:   . Minutes of Exercise per Session:   Stress:   . Feeling of Stress :   Social Connections:   . Frequency of  Communication with Friends and Family:   . Frequency of Social Gatherings with Friends and Family:   . Attends Religious Services:   . Active Member of Clubs or Organizations:   . Attends Archivist Meetings:   Marland Kitchen Marital Status:   Intimate Partner Violence:   . Fear of Current or Ex-Partner:   . Emotionally Abused:   Marland Kitchen Physically Abused:   .  Sexually Abused:    Review of Systems     Objective:   Physical Exam  Constitutional: No distress.  Cardiovascular: Intact distal pulses.  Musculoskeletal:     Comments: No spine tenderness SLR negative No right hip bursa tenderness Slight decreased internal rotation of both hips--but no pain  Neurological:  Antalgic gait favoring right leg No leg weakness           Assessment & Plan:

## 2019-06-24 ENCOUNTER — Encounter: Payer: Self-pay | Admitting: Internal Medicine

## 2019-06-24 ENCOUNTER — Other Ambulatory Visit: Payer: Self-pay | Admitting: Internal Medicine

## 2019-06-24 DIAGNOSIS — J0110 Acute frontal sinusitis, unspecified: Secondary | ICD-10-CM

## 2019-07-03 DIAGNOSIS — G4733 Obstructive sleep apnea (adult) (pediatric): Secondary | ICD-10-CM | POA: Diagnosis not present

## 2019-07-16 ENCOUNTER — Other Ambulatory Visit: Payer: Self-pay

## 2019-07-18 ENCOUNTER — Ambulatory Visit: Payer: Medicare HMO | Admitting: Internal Medicine

## 2019-07-31 ENCOUNTER — Ambulatory Visit: Payer: Medicare HMO | Admitting: Internal Medicine

## 2019-08-06 DIAGNOSIS — G4733 Obstructive sleep apnea (adult) (pediatric): Secondary | ICD-10-CM | POA: Diagnosis not present

## 2019-08-27 ENCOUNTER — Encounter: Payer: Self-pay | Admitting: Internal Medicine

## 2019-08-27 ENCOUNTER — Other Ambulatory Visit: Payer: Self-pay

## 2019-08-27 ENCOUNTER — Ambulatory Visit (INDEPENDENT_AMBULATORY_CARE_PROVIDER_SITE_OTHER): Payer: Medicare HMO | Admitting: Internal Medicine

## 2019-08-27 VITALS — BP 136/72 | HR 83 | Ht 68.0 in | Wt 247.0 lb

## 2019-08-27 DIAGNOSIS — G4733 Obstructive sleep apnea (adult) (pediatric): Secondary | ICD-10-CM

## 2019-08-27 DIAGNOSIS — R152 Fecal urgency: Secondary | ICD-10-CM | POA: Diagnosis not present

## 2019-08-27 DIAGNOSIS — E1159 Type 2 diabetes mellitus with other circulatory complications: Secondary | ICD-10-CM

## 2019-08-27 DIAGNOSIS — E1151 Type 2 diabetes mellitus with diabetic peripheral angiopathy without gangrene: Secondary | ICD-10-CM

## 2019-08-27 DIAGNOSIS — E1165 Type 2 diabetes mellitus with hyperglycemia: Secondary | ICD-10-CM | POA: Diagnosis not present

## 2019-08-27 DIAGNOSIS — IMO0002 Reserved for concepts with insufficient information to code with codable children: Secondary | ICD-10-CM

## 2019-08-27 DIAGNOSIS — I10 Essential (primary) hypertension: Secondary | ICD-10-CM | POA: Diagnosis not present

## 2019-08-27 LAB — POCT GLYCOSYLATED HEMOGLOBIN (HGB A1C): Hemoglobin A1C: 8.5 % — AB (ref 4.0–5.6)

## 2019-08-27 MED ORDER — DAPAGLIFLOZIN PROPANEDIOL 5 MG PO TABS: 5.0000 mg | ORAL_TABLET | Freq: Every day | ORAL | 5 refills | Status: DC

## 2019-08-27 NOTE — Progress Notes (Signed)
Subjective:    Patient ID: James Camacho, male    DOB: [birth year], 69 y.o.   MRN: [id number]  HPI Here for follow up of diabetes ---and GI problems This visit occurred during the SARS-CoV-2 public health emergency.  Safety protocols were in place, including screening questions prior to the visit, additional usage of staff PPE, and extensive cleaning of exam room while observing appropriate contact time as indicated for disinfecting solutions.   Has started seeing Dr Cruzita Lederer Had to reschedule appts---had 6 weeks of quarantine due to [place] in his household Was on semaglutide for 30 days but was too expensive Back on liraglutide now-- now back on liraglutide (apparently 1.8mg  daily now)  Now having GI problems for 5-6 months Intermittently will have fecal urgency after eating Has had some fecal incontinence but not commonly No abdominal pain  Current Outpatient Medications on File Prior to Visit  Medication Sig Dispense Refill  . amLODipine (NORVASC) 10 MG tablet TAKE 1 TABLET BY MOUTH EVERY DAY (Patient taking differently: Take 10 mg by mouth daily. ) 90 tablet 3  . atorvastatin (LIPITOR) 10 MG tablet TAKE 1 TABLET BY MOUTH EVERY DAY 90 tablet 3  . carvedilol (COREG) 12.5 MG tablet TAKE 1 TABLET (12.5 MG TOTAL) BY MOUTH 2 (TWO) TIMES DAILY WITH A MEAL. 180 tablet 3  . fluticasone (FLONASE) 50 MCG/ACT nasal spray SPRAY 2 SPRAYS INTO EACH NOSTRIL EVERY DAY 48 mL 3  . furosemide (LASIX) 40 MG tablet TAKE 1 TABLET BY MOUTH EVERY DAY 90 tablet 3  . Insulin Pen Needle (PEN NEEDLES) 32G X 6 MM MISC 1 each by Does not apply route daily. Use with Victoza once a day 50 each 11  . liraglutide (VICTOZA) 18 MG/3ML SOPN Inject 1.8 mg into the skin daily.    Marland Kitchen lisinopril (ZESTRIL) 40 MG tablet TAKE 1 TABLET BY MOUTH EVERY DAY (Patient taking differently: Take 40 mg by mouth daily. ) 90 tablet 3  . metFORMIN (GLUCOPHAGE-XR) 500 MG 24 hr tablet TAKE 2 TABLET BY MOUTH EVERY DAY 2x a day 360 tablet 3    . omeprazole (PRILOSEC) 20 MG capsule Take 1 capsule (20 mg total) by mouth 2 (two) times daily before a meal. 180 capsule 3  . sildenafil (REVATIO) 20 MG tablet Take 20 mg by mouth as needed.     No current facility-administered medications on file prior to visit.    Allergies  Allergen Reactions  . Aleve [Naproxen Sodium] Hives  . Other     Pt can only take Tylenol for pain- anything prescribed that he has tried breaks him out in a rash.   . Penicillins Rash  . Shellfish Allergy Hives    Past Medical History:  Diagnosis Date  . Anal condylomata   . Anxiety   . Depression   . ED (erectile dysfunction)   . History of adenomatous polyp of colon   . History of exercise stress test    ETT 04-17-2007 (in epic)  negative  . Hyperlipidemia    LDL in 120's  . Hypertension    followed by pcp  . OSA (obstructive sleep apnea)   . OSA on CPAP    study in epic 08-11-2017 moderate osa  . Seasonal allergic rhinitis   . Type 2 diabetes mellitus ([place])    followed by pcp  . Wears glasses   . Wears hearing aid in both ears     Past Surgical History:  Procedure Laterality Date  . CATARACT EXTRACTION W/PHACO  Right 06/28/2017   Procedure: CATARACT EXTRACTION PHACO AND INTRAOCULAR LENS PLACEMENT ([place]) RIGHT;  Surgeon: Leandrew Koyanagi, MD;  Location: [facility];  Service: Ophthalmology;  Laterality: Right;  . CATARACT EXTRACTION W/PHACO Left 07/19/2017   Procedure: CATARACT EXTRACTION PHACO AND INTRAOCULAR LENS PLACEMENT ([place]) LEFT TOPICAL;  Surgeon: Leandrew Koyanagi, MD;  Location: [facility];  Service: Ophthalmology;  Laterality: Left;  IVA TOPICALLEFTDIABETIC  . CHOLECYSTECTOMY OPEN  1980s   dr  Sharlet Salina   AND APPENDECTOMY  . COLONOSCOPY WITH PROPOFOL  last one 03-28-2018   dr pyrtle  . LASER ABLATION CONDOLAMATA N/A 12/20/2018   Procedure: LASER ABLATION OF CONDOLAMATA, EXAMINATION UNDER ANESTHESIA;  Surgeon: Michael Boston, MD;  Location: [facility];  Service: General;  Laterality: N/A;    Family History  Problem Relation Age of Onset  . Diabetes Mother   . Arthritis Mother   . Hypertension Mother   . Chronic Renal Failure Mother   . Parkinsonism Father   . Heart disease Father   . Healthy Sister   . Healthy Brother   . Healthy Sister   . Cancer Neg Hx        no colon or prostate cancer  . Colon cancer Neg Hx   . Esophageal cancer Neg Hx   . Stomach cancer Neg Hx   . Rectal cancer Neg Hx     Social History   Socioeconomic History  . Marital status: Married    Spouse name: Not on file  . Number of children: 3  . Years of education: Not on file  . Highest education level: Not on file  Occupational History  . 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 Smoking status: Never Smoker  . Smokeless tobacco: Never Used  Vaping Use  . Vaping Use: Never used  Substance and Sexual Activity  . Alcohol use: Not Currently    Comment: rare  . Drug use: Never  . Sexual activity: Not on file  Other Topics Concern  . Not on file  Social History Narrative   Has living will   Wife is health care POA--- children are alternates   Would accept resuscitation   No prolonged tube feeds   Social Determinants of Health   Financial Resource Strain:   . Difficulty of Paying Living Expenses:   Food Insecurity:   . Worried About Charity fundraiser in the Last Year:   . Arboriculturist in the Last Year:   Transportation Needs:   . Film/video editor (Medical):   Marland Kitchen Lack of Transportation (Non-Medical):   Physical Activity:   . Days of Exercise per Week:   . Minutes of Exercise per Session:   Stress:   . Feeling of Stress :   Social Connections:   . Frequency of Communication with Friends and Family:   . Frequency of Social Gatherings with Friends and Family:   . Attends Religious Services:   .  Active Member of Clubs or Organizations:   . Attends Archivist Meetings:   Marland Kitchen Marital Status:   Intimate Partner Violence:   . Fear of Current or Ex-Partner:   . Emotionally Abused:   Marland Kitchen Physically Abused:   . Sexually Abused:    Review of Systems  Heat tolerance has worsened No chest pain No SOB  Objective:   Physical Exam         Assessment & Plan:

## 2019-08-27 NOTE — Assessment & Plan Note (Signed)
BP Readings from Last 3 Encounters:  08/27/19 136/72  06/14/19 (!) 142/76  04/18/19 120/60   Good control on carvedilol, amlodipine and lisinopril

## 2019-08-27 NOTE — Assessment & Plan Note (Signed)
Clearly seems to be medication related May need to cut back on the liraglutide if more concerning

## 2019-08-27 NOTE — Assessment & Plan Note (Signed)
Uses CPAP nightly with good effect

## 2019-08-27 NOTE — Assessment & Plan Note (Signed)
Lab Results  Component Value Date   HGBA1C 8.5 (A) 08/27/2019   Still elevated Will add SGLT-2 ---dapagliflozin-- and discussed lifestyle He will follow up with Dr Cruzita Lederer

## 2019-08-30 NOTE — Telephone Encounter (Signed)
James Camacho is going to cost the pt $140 due to donut hole. He looked it up and there is no patient assistance for it.

## 2019-09-10 MED ORDER — CITALOPRAM HYDROBROMIDE 20 MG PO TABS: 20.0000 mg | ORAL_TABLET | Freq: Every day | ORAL | 1 refills | Status: DC

## 2019-09-10 NOTE — Telephone Encounter (Signed)
Please set him up for an appointment in about a month

## 2019-09-11 ENCOUNTER — Other Ambulatory Visit: Payer: Self-pay | Admitting: Internal Medicine

## 2019-09-20 DIAGNOSIS — G4733 Obstructive sleep apnea (adult) (pediatric): Secondary | ICD-10-CM | POA: Diagnosis not present

## 2019-09-27 DIAGNOSIS — G4733 Obstructive sleep apnea (adult) (pediatric): Secondary | ICD-10-CM | POA: Diagnosis not present

## 2019-10-02 ENCOUNTER — Other Ambulatory Visit: Payer: Self-pay | Admitting: Internal Medicine

## 2019-10-05 ENCOUNTER — Other Ambulatory Visit: Payer: Self-pay | Admitting: Internal Medicine

## 2019-10-07 ENCOUNTER — Other Ambulatory Visit: Payer: Self-pay | Admitting: Internal Medicine

## 2019-10-15 ENCOUNTER — Encounter: Payer: Self-pay | Admitting: Internal Medicine

## 2019-10-15 ENCOUNTER — Ambulatory Visit (INDEPENDENT_AMBULATORY_CARE_PROVIDER_SITE_OTHER): Payer: Medicare HMO | Admitting: Internal Medicine

## 2019-10-15 ENCOUNTER — Other Ambulatory Visit: Payer: Self-pay

## 2019-10-15 DIAGNOSIS — F39 Unspecified mood [affective] disorder: Secondary | ICD-10-CM

## 2019-10-15 DIAGNOSIS — R69 Illness, unspecified: Secondary | ICD-10-CM | POA: Diagnosis not present

## 2019-10-15 MED ORDER — CITALOPRAM HYDROBROMIDE 40 MG PO TABS: 40.0000 mg | ORAL_TABLET | Freq: Every day | ORAL | 3 refills | Status: DC

## 2019-10-15 NOTE — Progress Notes (Signed)
Subjective:    Patient ID: James Camacho, male    DOB: June 16, 1950, 69 y.o.   MRN: [id number]  HPI Here for follow up of anxiety This visit occurred during the SARS-CoV-2 public health emergency.  Safety protocols were in place, including screening questions prior to the visit, additional usage of staff PPE, and extensive cleaning of exam room while observing appropriate contact time as indicated for disinfecting solutions.   He has had a decrease in anxiety during the day It seems to wear off and he wakes anxious---"very panicked" Not really depressed though Sleeping okay--using the CPAP  Tolerating the victoza Using 1/2 of the amount bid (the whole dose is too much on his GI system at one time)  Current Outpatient Medications on File Prior to Visit  Medication Sig Dispense Refill  . amLODipine (NORVASC) 10 MG tablet TAKE 1 TABLET BY MOUTH EVERY DAY 90 tablet 3  . atorvastatin (LIPITOR) 10 MG tablet TAKE 1 TABLET BY MOUTH EVERY DAY 90 tablet 3  . carvedilol (COREG) 12.5 MG tablet TAKE 1 TABLET (12.5 MG TOTAL) BY MOUTH 2 (TWO) TIMES DAILY WITH A MEAL. 180 tablet 3  . citalopram (CELEXA) 20 MG tablet Take 1 tablet (20 mg total) by mouth daily. 30 tablet 1  . dapagliflozin propanediol (FARXIGA) 5 MG TABS tablet Take 1 tablet (5 mg total) by mouth daily. 30 tablet 5  . fluticasone (FLONASE) 50 MCG/ACT nasal spray SPRAY 2 SPRAYS INTO EACH NOSTRIL EVERY DAY 48 mL 3  . furosemide (LASIX) 40 MG tablet TAKE 1 TABLET BY MOUTH EVERY DAY 90 tablet 3  . glipiZIDE (GLUCOTROL) 5 MG tablet Take by mouth daily before breakfast.    . Insulin Pen Needle (PEN NEEDLES) 32G X 6 MM MISC 1 each by Does not apply route daily. Use with Victoza once a day 50 each 11  . liraglutide (VICTOZA) 18 MG/3ML SOPN Inject 0.53mls (1.8mg  total) into the skin daily 9 pen 11  . lisinopril (ZESTRIL) 40 MG tablet TAKE 1 TABLET BY MOUTH EVERY DAY 90 tablet 3  . metFORMIN (GLUCOPHAGE-XR) 500 MG 24 hr tablet TAKE 2 TABLET BY  MOUTH EVERY DAY 2x a day 360 tablet 3  . omeprazole (PRILOSEC) 20 MG capsule Take 1 capsule (20 mg total) by mouth 2 (two) times daily before a meal. 180 capsule 3  . sildenafil (REVATIO) 20 MG tablet Take 20 mg by mouth as needed.     No current facility-administered medications on file prior to visit.    Allergies  Allergen Reactions  . Aleve [Naproxen Sodium] Hives  . Other     Pt can only take Tylenol for pain- anything prescribed that he has tried breaks him out in a rash.   . Penicillins Rash  . Shellfish Allergy Hives    Past Medical History:  Diagnosis Date  . Anal condylomata   . Anxiety   . Depression   . ED (erectile dysfunction)   . History of adenomatous polyp of colon   . History of exercise stress test    ETT 04-17-2007 (in epic)  negative  . Hyperlipidemia    LDL in 120's  . Hypertension    followed by pcp  . OSA (obstructive sleep apnea)   . OSA on CPAP    study in epic 08-11-2017 moderate osa  . Seasonal allergic rhinitis   . Type 2 diabetes mellitus ([place])    followed by pcp  . Wears glasses   . Wears hearing aid in both  ears     Past Surgical History:  Procedure Laterality Date  . CATARACT EXTRACTION W/PHACO Right 06/28/2017   Procedure: CATARACT EXTRACTION PHACO AND INTRAOCULAR LENS PLACEMENT ([place]) RIGHT;  Surgeon: Leandrew Koyanagi, MD;  Location: [facility];  Service: Ophthalmology;  Laterality: Right;  . CATARACT EXTRACTION W/PHACO Left 07/19/2017   Procedure: CATARACT EXTRACTION PHACO AND INTRAOCULAR LENS PLACEMENT ([place]) LEFT TOPICAL;  Surgeon: Leandrew Koyanagi, MD;  Location: [facility];  Service: Ophthalmology;  Laterality: Left;  IVA TOPICALLEFTDIABETIC  . CHOLECYSTECTOMY OPEN  1980s   dr  Sharlet Salina   AND APPENDECTOMY  . COLONOSCOPY WITH PROPOFOL  last one 03-28-2018   dr pyrtle  . LASER ABLATION CONDOLAMATA N/A 12/20/2018   Procedure: LASER ABLATION OF CONDOLAMATA, EXAMINATION UNDER ANESTHESIA;  Surgeon: Michael Boston,  MD;  Location: [facility];  Service: General;  Laterality: N/A;    Family History  Problem Relation Age of Onset  . Diabetes Mother   . Arthritis Mother   . Hypertension Mother   . Chronic Renal Failure Mother   . Parkinsonism Father   . Heart disease Father   . Healthy Sister   . Healthy Brother   . Healthy Sister   . Cancer Neg Hx        no colon or prostate cancer  . Colon cancer Neg Hx   . Esophageal cancer Neg Hx   . Stomach cancer Neg Hx   . Rectal cancer Neg Hx     Social History   Socioeconomic History  . Marital status: Married    Spouse name: Not on file  . Number of children: 3  . Years of education: Not on file  . Highest education level: Not on file  Occupational History  . 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 Occupation: Engineer, site business    Comment:    Tobacco Use  . Smoking status: Never Smoker  . Smokeless tobacco: Never Used  Vaping Use  . Vaping Use: Never used  Substance and Sexual Activity  . Alcohol use: Not Currently    Comment: rare  . Drug use: Never  . Sexual activity: Not on file  Other Topics Concern  . Not on file  Social History Narrative   Has living will   Wife is health care POA--- children are alternates   Would accept resuscitation   No prolonged tube feeds   Social Determinants of Health   Financial Resource Strain:   . Difficulty of Paying Living Expenses:   Food Insecurity:   . Worried About Charity fundraiser in the Last Year:   . Arboriculturist in the Last Year:   Transportation Needs:   . Film/video editor (Medical):   Marland Kitchen Lack of Transportation (Non-Medical):   Physical Activity:   . Days of Exercise per Week:   . Minutes of Exercise per Session:   Stress:   . Feeling of Stress :   Social Connections:   . Frequency of Communication with Friends and Family:   . Frequency of Social Gatherings with Friends and Family:     . Attends Religious Services:   . Active Member of Clubs or Organizations:   . Attends Archivist Meetings:   Marland Kitchen Marital Status:   Intimate Partner Violence:   . Fear of Current or Ex-Partner:   . Emotionally Abused:   Marland Kitchen Physically Abused:   . Sexually Abused:  Review of Systems He has lost 6# already     Objective:   Physical Exam Constitutional:      Appearance: Normal appearance.  Neurological:     Mental Status: He is alert.  Psychiatric:        Mood and Affect: Mood normal.        Behavior: Behavior normal.            Assessment & Plan:

## 2019-10-15 NOTE — Assessment & Plan Note (Signed)
Has had a partial response--but his anxiety surges back by the morning No apparent side effects Discussed options--will increase to 40mg  daily He may try splitting the dose

## 2019-11-01 ENCOUNTER — Other Ambulatory Visit: Payer: Self-pay | Admitting: Internal Medicine

## 2019-11-14 DIAGNOSIS — G4733 Obstructive sleep apnea (adult) (pediatric): Secondary | ICD-10-CM | POA: Diagnosis not present

## 2019-11-26 ENCOUNTER — Other Ambulatory Visit: Payer: Self-pay | Admitting: Internal Medicine

## 2019-12-16 DIAGNOSIS — G4733 Obstructive sleep apnea (adult) (pediatric): Secondary | ICD-10-CM | POA: Diagnosis not present

## 2019-12-22 ENCOUNTER — Other Ambulatory Visit: Payer: Self-pay | Admitting: Internal Medicine

## 2019-12-24 ENCOUNTER — Encounter: Payer: Self-pay | Admitting: Internal Medicine

## 2019-12-24 ENCOUNTER — Other Ambulatory Visit: Payer: Self-pay

## 2019-12-24 ENCOUNTER — Ambulatory Visit (INDEPENDENT_AMBULATORY_CARE_PROVIDER_SITE_OTHER): Payer: Medicare HMO | Admitting: Internal Medicine

## 2019-12-24 VITALS — BP 102/62 | HR 69 | Temp 97.4°F | Ht 68.0 in | Wt 240.0 lb

## 2019-12-24 DIAGNOSIS — F39 Unspecified mood [affective] disorder: Secondary | ICD-10-CM

## 2019-12-24 DIAGNOSIS — R69 Illness, unspecified: Secondary | ICD-10-CM | POA: Diagnosis not present

## 2019-12-24 DIAGNOSIS — E1151 Type 2 diabetes mellitus with diabetic peripheral angiopathy without gangrene: Secondary | ICD-10-CM

## 2019-12-24 DIAGNOSIS — IMO0002 Reserved for concepts with insufficient information to code with codable children: Secondary | ICD-10-CM

## 2019-12-24 DIAGNOSIS — Z23 Encounter for immunization: Secondary | ICD-10-CM

## 2019-12-24 DIAGNOSIS — E1165 Type 2 diabetes mellitus with hyperglycemia: Secondary | ICD-10-CM | POA: Diagnosis not present

## 2019-12-24 NOTE — Assessment & Plan Note (Signed)
Mostly anxiety The higher citalopram dose is really holding him all day now No sig depression Will continue the 40mg  dose daily

## 2019-12-24 NOTE — Addendum Note (Signed)
Addended by: Pilar Grammes on: 12/24/2019 11:48 AM   Modules accepted: Orders

## 2019-12-24 NOTE — Progress Notes (Signed)
Subjective:    Patient ID: James Camacho, male    DOB: [birth year], 69 y.o.   MRN: [id number]  HPI Here for follow up of mood/anxiety This visit occurred during the SARS-CoV-2 public health emergency.  Safety protocols were in place, including screening questions prior to the visit, additional usage of staff PPE, and extensive cleaning of exam room while observing appropriate contact time as indicated for disinfecting solutions.   Feels his anxiety is better Higher citalopram dose is really helping No depression Sleeping fairly well---occasionally has crazy dreams if he takes the CPAP off  Current Outpatient Medications on File Prior to Visit  Medication Sig Dispense Refill  . amLODipine (NORVASC) 10 MG tablet TAKE 1 TABLET BY MOUTH EVERY DAY 90 tablet 3  . atorvastatin (LIPITOR) 10 MG tablet TAKE 1 TABLET BY MOUTH EVERY DAY 90 tablet 3  . carvedilol (COREG) 12.5 MG tablet TAKE 1 TABLET (12.5 MG TOTAL) BY MOUTH 2 (TWO) TIMES DAILY WITH A MEAL. 180 tablet 3  . citalopram (CELEXA) 40 MG tablet Take 1 tablet (40 mg total) by mouth daily. 90 tablet 3  . dapagliflozin propanediol (FARXIGA) 5 MG TABS tablet Take 1 tablet (5 mg total) by mouth daily. 30 tablet 5  . fluticasone (FLONASE) 50 MCG/ACT nasal spray SPRAY 2 SPRAYS INTO EACH NOSTRIL EVERY DAY 48 mL 3  . furosemide (LASIX) 40 MG tablet TAKE 1 TABLET BY MOUTH EVERY DAY 90 tablet 3  . Insulin Pen Needle (PEN NEEDLES) 32G X 6 MM MISC 1 each by Does not apply route daily. Use with Victoza once a day 50 each 11  . liraglutide (VICTOZA) 18 MG/3ML SOPN Inject 0.70mls (1.8mg  total) into the skin daily 9 pen 11  . lisinopril (ZESTRIL) 40 MG tablet TAKE 1 TABLET BY MOUTH EVERY DAY 90 tablet 3  . metFORMIN (GLUCOPHAGE-XR) 500 MG 24 hr tablet TAKE 2 TABLET BY MOUTH EVERY DAY 2x a day 360 tablet 3  . omeprazole (PRILOSEC) 20 MG capsule TAKE 1 CAPSULE BY MOUTH EVERY DAY 90 capsule 3  . sildenafil (REVATIO) 20 MG tablet Take 20 mg by mouth as  needed.     No current facility-administered medications on file prior to visit.    Allergies  Allergen Reactions  . Aleve [Naproxen Sodium] Hives  . Other     Pt can only take Tylenol for pain- anything prescribed that he has tried breaks him out in a rash.   . Penicillins Rash  . Shellfish Allergy Hives    Past Medical History:  Diagnosis Date  . Anal condylomata   . Anxiety   . Depression   . ED (erectile dysfunction)   . History of adenomatous polyp of colon   . History of exercise stress test    ETT 04-17-2007 (in epic)  negative  . Hyperlipidemia    LDL in 120's  . Hypertension    followed by pcp  . OSA (obstructive sleep apnea)   . OSA on CPAP    study in epic 08-11-2017 moderate osa  . Seasonal allergic rhinitis   . Type 2 diabetes mellitus ([place])    followed by pcp  . Wears glasses   . Wears hearing aid in both ears     Past Surgical History:  Procedure Laterality Date  . CATARACT EXTRACTION W/PHACO Right 06/28/2017   Procedure: CATARACT EXTRACTION PHACO AND INTRAOCULAR LENS PLACEMENT ([place]) RIGHT;  Surgeon: Leandrew Koyanagi, MD;  Location: [facility];  Service: Ophthalmology;  Laterality: Right;  .  CATARACT EXTRACTION W/PHACO Left 07/19/2017   Procedure: CATARACT EXTRACTION PHACO AND INTRAOCULAR LENS PLACEMENT ([place]) LEFT TOPICAL;  Surgeon: Leandrew Koyanagi, MD;  Location: [facility];  Service: Ophthalmology;  Laterality: Left;  IVA TOPICALLEFTDIABETIC  . CHOLECYSTECTOMY OPEN  1980s   dr  Sharlet Salina   AND APPENDECTOMY  . COLONOSCOPY WITH PROPOFOL  last one 03-28-2018   dr pyrtle  . LASER ABLATION CONDOLAMATA N/A 12/20/2018   Procedure: LASER ABLATION OF CONDOLAMATA, EXAMINATION UNDER ANESTHESIA;  Surgeon: Michael Boston, MD;  Location: [facility];  Service: General;  Laterality: N/A;    Family History  Problem Relation Age of Onset  . Diabetes Mother   . Arthritis Mother   . Hypertension Mother   . Chronic Renal  Failure Mother   . Parkinsonism Father   . Heart disease Father   . Healthy Sister   . Healthy Brother   . Healthy Sister   . Cancer Neg Hx        no colon or prostate cancer  . Colon cancer Neg Hx   . Esophageal cancer Neg Hx   . Stomach cancer Neg Hx   . Rectal cancer Neg Hx     Social History   Socioeconomic History  . Marital status: Married    Spouse name: Not on file  . Number of children: 3  . Years of education: Not on file  . Highest education level: Not on file  Occupational History  . 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 Occupation: Engineer, site business    Comment:    Tobacco Use  . Smoking status: Never Smoker  . Smokeless tobacco: Never Used  Vaping Use  . Vaping Use: Never used  Substance and Sexual Activity  . Alcohol use: Not Currently    Comment: rare  . Drug use: Never  . Sexual activity: Not on file  Other Topics Concern  . Not on file  Social History Narrative   Has living will   Wife is health care POA--- children are alternates   Would accept resuscitation   No prolonged tube feeds   Social Determinants of Health   Financial Resource Strain:   . Difficulty of Paying Living Expenses: Not on file  Food Insecurity:   . Worried About Charity fundraiser in the Last Year: Not on file  . Ran Out of Food in the Last Year: Not on file  Transportation Needs:   . Lack of Transportation (Medical): Not on file  . Lack of Transportation (Non-Medical): Not on file  Physical Activity:   . Days of Exercise per Week: Not on file  . Minutes of Exercise per Session: Not on file  Stress:   . Feeling of Stress : Not on file  Social Connections:   . Frequency of Communication with Friends and Family: Not on file  . Frequency of Social Gatherings with Friends and Family: Not on file  . Attends Religious Services: Not on file  . Active Member of Clubs or Organizations: Not on file  .  Attends Archivist Meetings: Not on file  . Marital Status: Not on file  Intimate Partner Violence:   . Fear of Current or Ex-Partner: Not on file  . Emotionally Abused: Not on file  . Physically Abused: Not on file  . Sexually Abused: Not on file   Review of Systems Sugars are good Still varying liraglutide doses---takes 1.8mg  every other  day/0.9 other days    Objective:   Physical Exam Constitutional:      Appearance: Normal appearance.  Neurological:     Mental Status: He is alert.  Psychiatric:        Mood and Affect: Mood normal.        Behavior: Behavior normal.            Assessment & Plan:

## 2019-12-24 NOTE — Assessment & Plan Note (Signed)
Tolerating current meds Will recheck at follow up Mostly 135-145 fasting

## 2020-01-16 DIAGNOSIS — G4733 Obstructive sleep apnea (adult) (pediatric): Secondary | ICD-10-CM | POA: Diagnosis not present

## 2020-01-23 ENCOUNTER — Other Ambulatory Visit: Payer: Self-pay | Admitting: Internal Medicine

## 2020-02-17 DIAGNOSIS — G4733 Obstructive sleep apnea (adult) (pediatric): Secondary | ICD-10-CM | POA: Diagnosis not present

## 2020-02-26 ENCOUNTER — Ambulatory Visit: Payer: Medicare HMO | Admitting: Internal Medicine

## 2020-03-12 MED ORDER — VICTOZA 18 MG/3ML ~~LOC~~ SOPN: PEN_INJECTOR | SUBCUTANEOUS | 1 refills | Status: DC

## 2020-03-18 ENCOUNTER — Other Ambulatory Visit: Payer: Self-pay | Admitting: Internal Medicine

## 2020-03-24 ENCOUNTER — Encounter: Payer: Medicare HMO | Admitting: Internal Medicine

## 2020-03-31 ENCOUNTER — Emergency Department: Payer: Medicare HMO

## 2020-03-31 ENCOUNTER — Other Ambulatory Visit: Payer: Self-pay

## 2020-03-31 ENCOUNTER — Emergency Department
Admission: EM | Admit: 2020-03-31 | Discharge: 2020-03-31 | Disposition: A | Discharge status: Home / Self Care | Payer: Medicare HMO | Attending: Emergency Medicine | Admitting: Emergency Medicine

## 2020-03-31 DIAGNOSIS — Z20822 Contact with and (suspected) exposure to covid-19: Secondary | ICD-10-CM | POA: Diagnosis not present

## 2020-03-31 DIAGNOSIS — R0902 Hypoxemia: Secondary | ICD-10-CM | POA: Diagnosis not present

## 2020-03-31 DIAGNOSIS — R112 Nausea with vomiting, unspecified: Secondary | ICD-10-CM | POA: Insufficient documentation

## 2020-03-31 DIAGNOSIS — R197 Diarrhea, unspecified: Secondary | ICD-10-CM | POA: Insufficient documentation

## 2020-03-31 DIAGNOSIS — Z794 Long term (current) use of insulin: Secondary | ICD-10-CM | POA: Diagnosis not present

## 2020-03-31 DIAGNOSIS — R1111 Vomiting without nausea: Secondary | ICD-10-CM | POA: Diagnosis not present

## 2020-03-31 DIAGNOSIS — I10 Essential (primary) hypertension: Secondary | ICD-10-CM | POA: Diagnosis not present

## 2020-03-31 DIAGNOSIS — R21 Rash and other nonspecific skin eruption: Secondary | ICD-10-CM | POA: Diagnosis not present

## 2020-03-31 DIAGNOSIS — R0789 Other chest pain: Secondary | ICD-10-CM | POA: Insufficient documentation

## 2020-03-31 DIAGNOSIS — R079 Chest pain, unspecified: Secondary | ICD-10-CM | POA: Diagnosis not present

## 2020-03-31 DIAGNOSIS — Z79899 Other long term (current) drug therapy: Secondary | ICD-10-CM | POA: Diagnosis not present

## 2020-03-31 DIAGNOSIS — Z7984 Long term (current) use of oral hypoglycemic drugs: Secondary | ICD-10-CM | POA: Diagnosis not present

## 2020-03-31 DIAGNOSIS — E1151 Type 2 diabetes mellitus with diabetic peripheral angiopathy without gangrene: Secondary | ICD-10-CM | POA: Insufficient documentation

## 2020-03-31 DIAGNOSIS — R0602 Shortness of breath: Secondary | ICD-10-CM | POA: Diagnosis not present

## 2020-03-31 DIAGNOSIS — R110 Nausea: Secondary | ICD-10-CM | POA: Diagnosis not present

## 2020-03-31 LAB — CBC
HCT: 49.9 % (ref 39.0–52.0)
Hemoglobin: 16.3 g/dL (ref 13.0–17.0)
MCH: 30.6 pg (ref 26.0–34.0)
MCHC: 32.7 g/dL (ref 30.0–36.0)
MCV: 93.6 fL (ref 80.0–100.0)
Platelets: 285 10*3/uL (ref 150–400)
RBC: 5.33 MIL/uL (ref 4.22–5.81)
RDW: 13.4 % (ref 11.5–15.5)
WBC: 25.4 10*3/uL — ABNORMAL HIGH (ref 4.0–10.5)
nRBC: 0 % (ref 0.0–0.2)

## 2020-03-31 LAB — BASIC METABOLIC PANEL
Anion gap: 13 (ref 5–15)
BUN: 12 mg/dL (ref 8–23)
CO2: 28 mmol/L (ref 22–32)
Calcium: 9.8 mg/dL (ref 8.9–10.3)
Chloride: 97 mmol/L — ABNORMAL LOW (ref 98–111)
Creatinine, Ser: 1.07 mg/dL (ref 0.61–1.24)
GFR, Estimated: >60 mL/min (ref >60)
Glucose, Bld: 171 mg/dL — ABNORMAL HIGH (ref 70–99)
Potassium: 3.5 mmol/L (ref 3.5–5.1)
Sodium: 138 mmol/L (ref 135–145)

## 2020-03-31 LAB — TROPONIN I (HIGH SENSITIVITY)
Troponin I (High Sensitivity): 7 ng/L (ref <18)
Troponin I (High Sensitivity): 3 ng/L (ref <18)

## 2020-03-31 MED ORDER — ONDANSETRON HCL 4 MG/2ML IJ SOLN
INTRAMUSCULAR | Status: AC
  Administered 2020-03-31: 4 mg via INTRAVENOUS
  Filled 2020-03-31: qty 2

## 2020-03-31 MED ORDER — ONDANSETRON 4 MG PO TBDP: 4.0000 mg | ORAL_TABLET | Freq: Three times a day (TID) | ORAL | 0 refills | Status: DC | PRN

## 2020-03-31 MED ORDER — ONDANSETRON HCL 4 MG/2ML IJ SOLN: 4.0000 mg | Freq: Once | INTRAMUSCULAR | Status: AC

## 2020-03-31 NOTE — ED Triage Notes (Signed)
Pt comes into the Ed via EMS from home with c/o chest pain with N/V, diaphoresis, now 0/10 pain  ASA324mg , 4mg  zofran #20gLAC  111/73 80'sHR 98%RA

## 2020-03-31 NOTE — ED Notes (Signed)
Pt given crackers and water. Pt states his BS is dropping.

## 2020-03-31 NOTE — ED Triage Notes (Signed)
Pt comes via EMS from home with c/o central CP, N/V and diaphoresis. Pt given aspirin and zofran.  Pt states no radiation.  Pt states no pain at this time. Pt states weakness.

## 2020-03-31 NOTE — ED Provider Notes (Signed)
[facility] Emergency Department Provider Note   ____________________________________________   Event Date/Time   First MD Initiated Contact with Patient 03/31/20 1931     (approximate)  I have reviewed the triage vital signs and the nursing notes.   HISTORY  Chief Complaint Chest Pain    HPI James Camacho is a 70 y.o. male with past medical history of hypertension, hyperlipidemia, and diabetes who presents to the ED for chest pain.  Patient reports that he was driving home from work this afternoon when he has had sudden onset of pressure near his lower chest and epigastrium.  He felt very sweaty and nauseous, vomited once and had an episode of diarrhea.  He spoke with his daughter, who is a Marine scientist and recommended he call EMS.  He has felt much better since arriving in the ED, states chest pain has resolved and he denies any difficulty breathing.  He has had no further nausea, vomiting, or diarrhea.  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  He is fully vaccinated against COVID-19.        Past Medical History:  Diagnosis Date  . Anal condylomata   . Anxiety   . Depression   . ED (erectile dysfunction)   . History of adenomatous polyp of colon   . History of exercise stress test    ETT 04-17-2007 (in epic)  negative  . Hyperlipidemia    LDL in 120's  . Hypertension    followed by pcp  . OSA (obstructive sleep apnea)   . OSA on CPAP    study in epic 08-11-2017 moderate osa  . Seasonal allergic rhinitis   . Type 2 diabetes mellitus ([place])    followed by pcp  . Wears glasses   . Wears hearing aid in both ears     Patient Active Problem List   Diagnosis Date Noted  . Fecal urgency 08/27/2019  . Radiculopathy 06/14/2019  . Gastroenteritis 03/26/2019  . Hypokalemia 03/26/2019  . GERD (gastroesophageal reflux disease) 02/20/2019  . OSA (obstructive sleep  apnea)   . Condyloma acuminatum of anus 12/20/2018  . Urinary urgency 05/28/2018  . Advance directive discussed with patient 06/20/2017  . DM (diabetes mellitus) type II uncontrolled, periph vascular disorder ([place]) 01/04/2017  . Allergic reaction to shellfish 07/25/2014  . Dyslipidemia   . Severe obesity (BMI 35.0-39.9) with comorbidity ([place]) 07/12/2011  . Routine general medical examination at a health care facility 01/11/2011  . Actinic keratosis 01/11/2011  . Sleep disturbance 07/06/2010  . Mood disorder ([place]) 07/06/2007  . Essential hypertension, benign 07/06/2007  . 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 CATARACT EXTRACTION W/PHACO Right 06/28/2017   Procedure: CATARACT EXTRACTION PHACO AND INTRAOCULAR LENS PLACEMENT ([place]) RIGHT;  Surgeon: Leandrew Koyanagi, MD;  Location: [place];  Service: Ophthalmology;  Laterality: Right;  . CATARACT EXTRACTION W/PHACO Left 07/19/2017   Procedure: CATARACT EXTRACTION PHACO AND INTRAOCULAR LENS PLACEMENT ([place]) LEFT TOPICAL;  Surgeon: Leandrew Koyanagi, MD;  Location: [place];  Service: Ophthalmology;  Laterality: Left;  IVA TOPICALLEFTDIABETIC  . CHOLECYSTECTOMY OPEN  1980s   dr  Sharlet Salina   AND APPENDECTOMY  . COLONOSCOPY WITH PROPOFOL  last one 03-28-2018   dr pyrtle  . LASER ABLATION CONDOLAMATA N/A 12/20/2018   Procedure: LASER ABLATION OF CONDOLAMATA, EXAMINATION UNDER ANESTHESIA;  Surgeon: Michael Boston, MD;  Location:  [place];  Service: General;  Laterality: N/A;    Prior to Admission medications   Medication Sig Start Date End Date Taking? Authorizing Provider  ondansetron (ZOFRAN ODT) 4 MG disintegrating tablet Take 1 tablet (4 mg total) by mouth every 8 (eight) hours as needed for nausea or vomiting. 03/31/20  Yes Blake Divine, MD  amLODipine (NORVASC) 10 MG tablet TAKE 1 TABLET BY MOUTH EVERY DAY 01/23/20   Viviana Simpler I, MD   atorvastatin (LIPITOR) 10 MG tablet TAKE 1 TABLET BY MOUTH EVERY DAY 12/23/19   Viviana Simpler I, MD  carvedilol (COREG) 12.5 MG tablet TAKE 1 TABLET (12.5 MG TOTAL) BY MOUTH 2 (TWO) TIMES DAILY WITH A MEAL. 03/18/20   Venia Carbon, MD  citalopram (CELEXA) 40 MG tablet Take 1 tablet (40 mg total) by mouth daily. 10/15/19   Venia Carbon, MD  dapagliflozin propanediol (FARXIGA) 5 MG TABS tablet Take 1 tablet (5 mg total) by mouth daily. 08/27/19   Venia Carbon, MD  fluticasone (FLONASE) 50 MCG/ACT nasal spray SPRAY 2 SPRAYS INTO EACH NOSTRIL EVERY DAY 06/25/19   Venia Carbon, MD  furosemide (LASIX) 40 MG tablet TAKE 1 TABLET BY MOUTH EVERY DAY 01/23/20   Viviana Simpler I, MD  Insulin Pen Needle (PEN NEEDLES) 32G X 6 MM MISC 1 each by Does not apply route daily. Use with Victoza once a day 09/05/18   Venia Carbon, MD  liraglutide (VICTOZA) 18 MG/3ML SOPN Inject 0.71mls (1.8mg  total) into the skin daily 03/12/20   Viviana Simpler I, MD  lisinopril (ZESTRIL) 40 MG tablet TAKE 1 TABLET BY MOUTH EVERY DAY 01/23/20   Viviana Simpler I, MD  metFORMIN (GLUCOPHAGE-XR) 500 MG 24 hr tablet TAKE 2 TABLET BY MOUTH EVERY DAY 2x a day 04/18/19   Philemon Kingdom, MD  omeprazole (PRILOSEC) 20 MG capsule TAKE 1 CAPSULE BY MOUTH EVERY DAY 11/27/19   Viviana Simpler I, MD  sildenafil (REVATIO) 20 MG tablet Take 20 mg by mouth as needed.    [provider]    Allergies Aleve [naproxen sodium], Other, Penicillins, and Shellfish allergy  Family History  Problem Relation Age of Onset  . Diabetes Mother   . Arthritis Mother   . Hypertension Mother   . Chronic Renal Failure Mother   . Parkinsonism Father   . Heart disease Father   . Healthy Sister   . Healthy Brother   . Healthy Sister   . Cancer Neg Hx        no colon or prostate cancer  . Colon cancer Neg Hx   . Esophageal cancer Neg Hx   . Stomach cancer Neg Hx   . Rectal cancer Neg Hx     Social History Social History    Tobacco Use  . Smoking status: Never Smoker  . Smokeless tobacco: Never Used  Vaping Use  . Vaping Use: Never used  Substance Use Topics  . Alcohol use: Not Currently    Comment: rare  . Drug use: Never    Review of Systems  Constitutional: No fever/chills Eyes: No visual changes. ENT: No sore throat. Cardiovascular: Positive for chest pain. Respiratory: Denies shortness of breath. Gastrointestinal: No abdominal pain.  Positive for nausea, vomiting, and diarrhea.  No constipation. Genitourinary: Negative for dysuria. Musculoskeletal: Negative for back pain. Skin: Negative for rash. Neurological: Negative for headaches, focal weakness or numbness.  ____________________________________________   PHYSICAL EXAM:  VITAL SIGNS: ED Triage Vitals [03/31/20 1457]  Enc Vitals  Group     BP (!) 110/96     Pulse Rate 94     Resp 18     Temp 98.2 F (36.8 C)     Temp src      SpO2 100 %     Weight      Height      Head Circumference      Peak Flow      Pain Score 0     Pain Loc      Pain Edu?      Excl. in [place]?     Constitutional: Alert and oriented. Eyes: Conjunctivae are normal. Head: Atraumatic. Nose: No congestion/rhinnorhea. Mouth/Throat: Mucous membranes are moist. Neck: Normal ROM Cardiovascular: Normal rate, regular rhythm. Grossly normal heart sounds.  2+ radial pulses bilaterally. Respiratory: Normal respiratory effort.  No retractions. Lungs CTAB. Gastrointestinal: Soft and nontender. No distention. Genitourinary: deferred Musculoskeletal: No lower extremity tenderness nor edema. Neurologic:  Normal speech and language. No gross focal neurologic deficits are appreciated. Skin:  Skin is warm, dry and intact. No rash noted. Psychiatric: Mood and affect are normal. Speech and behavior are normal.  ____________________________________________   LABS (all labs ordered are listed, but only abnormal results are displayed)  Labs Reviewed  BASIC METABOLIC  PANEL - Abnormal; Notable for the following components:      Result Value   Chloride 97 (*)    Glucose, Bld 171 (*)    All other components within normal limits  CBC - Abnormal; Notable for the following components:   WBC 25.4 (*)    All other components within normal limits  SARS CORONAVIRUS 2 (TAT 6-24 HRS)  TROPONIN I (HIGH SENSITIVITY)  TROPONIN I (HIGH SENSITIVITY)   ____________________________________________  EKG  ED ECG REPORT I, Blake Divine, the attending physician, personally viewed and interpreted this ECG.   Date: 03/31/2020  EKG Time: 14:51  Rate: 93  Rhythm: normal sinus rhythm  Axis: Normal  Intervals:none  ST&T Change: None   PROCEDURES  Procedure(s) performed (including Critical Care):  Procedures   ____________________________________________   INITIAL IMPRESSION / ASSESSMENT AND PLAN / ED COURSE       70 year old male with past medical history of hypertension, hyperlipidemia, diabetes who presents to the ED for episode of chest pressure, nausea, vomiting, and diarrhea.  All symptoms have subsequently resolved and patient states he is feeling much better.  EKG shows no evidence of arrhythmia or ischemia and 2 sets of troponin are negative.  I doubt ACS, PE, or dissection given resolution of his symptoms.  Chest x-ray reviewed by me and shows no infiltrate, edema, or effusion.  Will perform testing for COVID-19 but otherwise patient is appropriate for discharge home with PCP follow-up.  He was counseled to return to the ED for new worsening symptoms, patient agrees with plan.      ____________________________________________   FINAL CLINICAL IMPRESSION(S) / ED DIAGNOSES  Final diagnoses:  Atypical chest pain  Nausea vomiting and diarrhea     ED Discharge Orders         Ordered    ondansetron (ZOFRAN ODT) 4 MG disintegrating tablet  Every 8 hours PRN        03/31/20 2000           Note:  This document was prepared using Dragon  voice recognition software and may include unintentional dictation errors.   Blake Divine, MD 03/31/20 2009

## 2020-04-01 ENCOUNTER — Other Ambulatory Visit: Payer: Self-pay | Admitting: Internal Medicine

## 2020-04-01 LAB — SARS CORONAVIRUS 2 (TAT 6-24 HRS): SARS Coronavirus 2: NEGATIVE

## 2020-04-01 MED ORDER — FREESTYLE LIBRE 14 DAY SENSOR MISC: 1.0000 | 12 refills | Status: DC

## 2020-04-01 NOTE — Addendum Note (Signed)
Addended by: Pilar Grammes on: 04/01/2020 03:03 PM   Modules accepted: Orders

## 2020-04-02 ENCOUNTER — Encounter: Payer: Self-pay | Admitting: Internal Medicine

## 2020-04-02 ENCOUNTER — Other Ambulatory Visit: Payer: Self-pay

## 2020-04-02 ENCOUNTER — Ambulatory Visit (INDEPENDENT_AMBULATORY_CARE_PROVIDER_SITE_OTHER): Payer: Medicare HMO | Admitting: Internal Medicine

## 2020-04-02 VITALS — BP 106/70 | HR 69 | Temp 97.1°F | Ht 68.0 in | Wt 226.0 lb

## 2020-04-02 DIAGNOSIS — D72829 Elevated white blood cell count, unspecified: Secondary | ICD-10-CM | POA: Insufficient documentation

## 2020-04-02 DIAGNOSIS — E1151 Type 2 diabetes mellitus with diabetic peripheral angiopathy without gangrene: Secondary | ICD-10-CM

## 2020-04-02 DIAGNOSIS — R079 Chest pain, unspecified: Secondary | ICD-10-CM

## 2020-04-02 DIAGNOSIS — D72820 Lymphocytosis (symptomatic): Secondary | ICD-10-CM

## 2020-04-02 DIAGNOSIS — IMO0002 Reserved for concepts with insufficient information to code with codable children: Secondary | ICD-10-CM

## 2020-04-02 DIAGNOSIS — E1165 Type 2 diabetes mellitus with hyperglycemia: Secondary | ICD-10-CM | POA: Diagnosis not present

## 2020-04-02 MED ORDER — SILDENAFIL CITRATE 20 MG PO TABS: 20.0000 mg | ORAL_TABLET | ORAL | 11 refills | Status: DC | PRN

## 2020-04-02 NOTE — Assessment & Plan Note (Signed)
Likely an acute phase reaction with his spell Will recheck --just in case

## 2020-04-02 NOTE — Progress Notes (Signed)
Subjective:    Patient ID: James Camacho, male    DOB: [birth year], 70 y.o.   MRN: [id number]  HPI Here for ER follow up This visit occurred during the SARS-CoV-2 public health emergency.  Safety protocols were in place, including screening questions prior to the visit, additional usage of staff PPE, and extensive cleaning of exam room while observing appropriate contact time as indicated for disinfecting solutions.   Has had intermittent problems with pain since past GI hospitalization Had been concerned about the ozempic--back to victoza  Had bad feeling in chest Then had to run to the bathroom with diarrhea Nausea and did throw up Lightheaded and sweaty Called daughter--nurse. Made him call EMS  Reviewed ER records troponins negative  Worried about the WBC---25K  Current Outpatient Medications on File Prior to Visit  Medication Sig Dispense Refill  . amLODipine (NORVASC) 10 MG tablet TAKE 1 TABLET BY MOUTH EVERY DAY 90 tablet 3  . atorvastatin (LIPITOR) 10 MG tablet TAKE 1 TABLET BY MOUTH EVERY DAY 90 tablet 3  . carvedilol (COREG) 12.5 MG tablet TAKE 1 TABLET (12.5 MG TOTAL) BY MOUTH 2 (TWO) TIMES DAILY WITH A MEAL. 180 tablet 3  . citalopram (CELEXA) 40 MG tablet Take 1 tablet (40 mg total) by mouth daily. 90 tablet 3  . Continuous Blood Gluc Sensor (FREESTYLE LIBRE 14 DAY SENSOR) MISC 1 EACH BY DOES NOT APPLY ROUTE EVERY 14 (FOURTEEN) DAYS. DX CODE E11.51 2 each 12  . dapagliflozin propanediol (FARXIGA) 5 MG TABS tablet Take 1 tablet (5 mg total) by mouth daily. 30 tablet 5  . fluticasone (FLONASE) 50 MCG/ACT nasal spray SPRAY 2 SPRAYS INTO EACH NOSTRIL EVERY DAY 48 mL 3  . furosemide (LASIX) 40 MG tablet TAKE 1 TABLET BY MOUTH EVERY DAY 90 tablet 3  . Insulin Pen Needle (PEN NEEDLES) 32G X 6 MM MISC 1 each by Does not apply route daily. Use with Victoza once a day 50 each 11  . liraglutide (VICTOZA) 18 MG/3ML SOPN Inject 0.88mls (1.8mg  total) into the skin daily 9 mL 1  .  lisinopril (ZESTRIL) 40 MG tablet TAKE 1 TABLET BY MOUTH EVERY DAY 90 tablet 3  . omeprazole (PRILOSEC) 20 MG capsule TAKE 1 CAPSULE BY MOUTH EVERY DAY 90 capsule 3  . ondansetron (ZOFRAN ODT) 4 MG disintegrating tablet Take 1 tablet (4 mg total) by mouth every 8 (eight) hours as needed for nausea or vomiting. 12 tablet 0  . sildenafil (REVATIO) 20 MG tablet Take 20 mg by mouth as needed.    . metFORMIN (GLUCOPHAGE-XR) 500 MG 24 hr tablet TAKE 2 TABLET BY MOUTH EVERY DAY 2x a day (Patient not taking: Reported on 04/02/2020) 360 tablet 3   No current facility-administered medications on file prior to visit.    Allergies  Allergen Reactions  . Aleve [Naproxen Sodium] Hives  . Other     Pt can only take Tylenol for pain- anything prescribed that he has tried breaks him out in a rash.   . Penicillins Rash  . Shellfish Allergy Hives    Past Medical History:  Diagnosis Date  . Anal condylomata   . Anxiety   . Depression   . ED (erectile dysfunction)   . History of adenomatous polyp of colon   . History of exercise stress test    ETT 04-17-2007 (in epic)  negative  . Hyperlipidemia    LDL in 120's  . Hypertension    followed by pcp  . OSA (obstructive  sleep apnea)   . OSA on CPAP    study in epic 08-11-2017 moderate osa  . Seasonal allergic rhinitis   . Type 2 diabetes mellitus ([place])    followed by pcp  . Wears glasses   . Wears hearing aid in both ears     Past Surgical History:  Procedure Laterality Date  . CATARACT EXTRACTION W/PHACO Right 06/28/2017   Procedure: CATARACT EXTRACTION PHACO AND INTRAOCULAR LENS PLACEMENT ([place]) RIGHT;  Surgeon: Leandrew Koyanagi, MD;  Location: [place];  Service: Ophthalmology;  Laterality: Right;  . CATARACT EXTRACTION W/PHACO Left 07/19/2017   Procedure: CATARACT EXTRACTION PHACO AND INTRAOCULAR LENS PLACEMENT ([place]) LEFT TOPICAL;  Surgeon: Leandrew Koyanagi, MD;  Location: [place];  Service: Ophthalmology;   Laterality: Left;  IVA TOPICALLEFTDIABETIC  . CHOLECYSTECTOMY OPEN  1980s   dr  Sharlet Salina   AND APPENDECTOMY  . COLONOSCOPY WITH PROPOFOL  last one 03-28-2018   dr pyrtle  . LASER ABLATION CONDOLAMATA N/A 12/20/2018   Procedure: LASER ABLATION OF CONDOLAMATA, EXAMINATION UNDER ANESTHESIA;  Surgeon: Michael Boston, MD;  Location: [place];  Service: General;  Laterality: N/A;    Family History  Problem Relation Age of Onset  . Diabetes Mother   . Arthritis Mother   . Hypertension Mother   . Chronic Renal Failure Mother   . Parkinsonism Father   . Heart disease Father   . Healthy Sister   . Healthy Brother   . Healthy Sister   . Cancer Neg Hx        no colon or prostate cancer  . Colon cancer Neg Hx   . Esophageal cancer Neg Hx   . Stomach cancer Neg Hx   . Rectal cancer Neg Hx     Social History   Socioeconomic History  . Marital status: Married    Spouse name: Not on file  . Number of children: 3  . Years of education: Not on file  . Highest education level: Not on file  Occupational History  . 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 Occupation: Engineer, site business    Comment:    Tobacco Use  . Smoking status: Never Smoker  . Smokeless tobacco: Never Used  Vaping Use  . Vaping Use: Never used  Substance and Sexual Activity  . Alcohol use: Not Currently    Comment: rare  . Drug use: Never  . Sexual activity: Not on file  Other Topics Concern  . Not on file  Social History Narrative   Has living will   Wife is health care POA--- children are alternates   Would accept resuscitation   No prolonged tube feeds   Social Determinants of Health   Financial Resource Strain: Not on file  Food Insecurity: Not on file  Transportation Needs: Not on file  Physical Activity: Not on file  Stress: Not on file  Social Connections: Not on file  Intimate Partner Violence: Not on file    Review of Systems Has lost weight Sugars are better now--- using the CGM (Freestyle) Average is in the low 100's now Stopped metformin---couldn't tolerate even one a day    Objective:   Physical Exam Constitutional:      Appearance: Normal appearance.  Cardiovascular:     Rate and Rhythm: Normal rate and regular rhythm.     Heart sounds: No murmur heard.   Pulmonary:     Effort: Pulmonary effort is  normal.     Breath sounds: Normal breath sounds. No wheezing or rales.  Abdominal:     Palpations: Abdomen is soft. There is no mass.     Tenderness: There is no abdominal tenderness.  Musculoskeletal:     Cervical back: Neck supple.     Right lower leg: No edema.     Left lower leg: No edema.  Lymphadenopathy:     Cervical: No cervical adenopathy.  Neurological:     Mental Status: He is alert.  Psychiatric:        Mood and Affect: Mood normal.        Behavior: Behavior normal.            Assessment & Plan:

## 2020-04-02 NOTE — Assessment & Plan Note (Signed)
Had severe spell with diaphoresis, vomiting, diarrhea ER evaluation showed no evidence of acute ischemia This is likely ongoing GI reaction from the liraglutide---but he is certainly high risk for coronary ischemia so I have recommended cardiology evaluation

## 2020-04-02 NOTE — Assessment & Plan Note (Signed)
Seems to be much better now It has helped to have the CGM On farxiga and liraglutide He did cut back on the liraglutide dose due to this reaction Will check A1c again

## 2020-04-03 LAB — CBC WITH DIFFERENTIAL/PLATELET
Basophils Absolute: 0.2 10*3/uL — ABNORMAL HIGH (ref 0.0–0.1)
Basophils Relative: 1.6 % (ref 0.0–3.0)
Eosinophils Absolute: 0.2 10*3/uL (ref 0.0–0.7)
Eosinophils Relative: 2.2 % (ref 0.0–5.0)
HCT: 44.1 % (ref 39.0–52.0)
Hemoglobin: 14.7 g/dL (ref 13.0–17.0)
Lymphocytes Relative: 18.8 % (ref 12.0–46.0)
Lymphs Abs: 2 10*3/uL (ref 0.7–4.0)
MCHC: 33.3 g/dL (ref 30.0–36.0)
MCV: 92.7 fl (ref 78.0–100.0)
Monocytes Absolute: 1.1 10*3/uL — ABNORMAL HIGH (ref 0.1–1.0)
Monocytes Relative: 9.8 % (ref 3.0–12.0)
Neutro Abs: 7.3 10*3/uL (ref 1.4–7.7)
Neutrophils Relative %: 67.6 % (ref 43.0–77.0)
Platelets: 233 10*3/uL (ref 150.0–400.0)
RBC: 4.76 Mil/uL (ref 4.22–5.81)
RDW: 14.3 % (ref 11.5–15.5)
WBC: 10.9 10*3/uL — ABNORMAL HIGH (ref 4.0–10.5)

## 2020-04-03 LAB — HEMOGLOBIN A1C: Hgb A1c MFr Bld: 6.2 % (ref 4.6–6.5)

## 2020-04-06 ENCOUNTER — Encounter: Payer: Self-pay | Admitting: Cardiology

## 2020-04-06 ENCOUNTER — Other Ambulatory Visit: Payer: Self-pay

## 2020-04-06 ENCOUNTER — Ambulatory Visit: Payer: Medicare HMO | Admitting: Cardiology

## 2020-04-06 VITALS — BP 112/68 | HR 74 | Ht 68.0 in | Wt 230.2 lb

## 2020-04-06 DIAGNOSIS — I10 Essential (primary) hypertension: Secondary | ICD-10-CM

## 2020-04-06 DIAGNOSIS — R079 Chest pain, unspecified: Secondary | ICD-10-CM | POA: Diagnosis not present

## 2020-04-06 DIAGNOSIS — E7800 Pure hypercholesterolemia, unspecified: Secondary | ICD-10-CM

## 2020-04-06 NOTE — Progress Notes (Signed)
Cardiology Office Note:    Date:  04/06/2020   ID:  James Camacho, DOB 06-25-1950, MRN [id number]  PCP:  Karie Schwalbe, MD  [facility] Cardiologist:  Debbe Odea, MD  [facility] Electrophysiologist:  None   Referring MD: Karie Schwalbe, MD   Chief Complaint  Patient presents with  . Other    Chest pain c/o occassional vomiting/nausea, heavy sweats, diarrhea. Meds reviewed verbally with pt.    History of Present Illness:    James Camacho is a 70 y.o. male with a hx of hyperlipidemia, hypertension, OSA on CPAP, diabetes anxiety who presents due to chest pain.  Patient states having symptoms of chest discomfort which he calls as pressure over the past 6 months.  Recent occurrence was 1 week ago.  Symptoms are usually associated with nausea/vomiting and or diarrhea.  Symptoms typically last for the duration of his GI symptoms.  He has noticed GI symptoms with some of his diabetic medications, medical management being performed by his primary care provider.  He denies symptoms of chest discomfort without having nausea, vomiting or diarrhea.  Denies smoking, symptoms are not related with exertion.  Seen in the ED 03/31/2020 due to chest pain.  Troponins x2 were negative, EKG with no evidence of ischemia.  Advised to follow-up with cardiology as outpatient.  Past Medical History:  Diagnosis Date  . Anal condylomata   . Anxiety   . Depression   . ED (erectile dysfunction)   . History of adenomatous polyp of colon   . History of exercise stress test    ETT 04-17-2007 (in epic)  negative  . Hyperlipidemia    LDL in 120's  . Hypertension    followed by pcp  . OSA (obstructive sleep apnea)   . OSA on CPAP    study in epic 08-11-2017 moderate osa  . Seasonal allergic rhinitis   . Type 2 diabetes mellitus (HCC)    followed by pcp  . Wears glasses   . Wears hearing aid in both ears     Past Surgical History:  Procedure Laterality Date  . CATARACT  EXTRACTION W/PHACO Right 06/28/2017   Procedure: CATARACT EXTRACTION PHACO AND INTRAOCULAR LENS PLACEMENT (IOC) RIGHT;  Surgeon: Lockie Mola, MD;  Location[facility];  Service: Ophthalmology;  Laterality: Right;  . CATARACT EXTRACTION W/PHACO Left 07/19/2017   Procedure: CATARACT EXTRACTION PHACO AND INTRAOCULAR LENS PLACEMENT (IOC) LEFT TOPICAL;  Surgeon: Lockie Mola, MD;  Location: [facility];  Service: Ophthalmology;  Laterality: Left;  IVA TOPICALLEFTDIABETIC  . CHOLECYSTECTOMY OPEN  1980s   dr  Okey Dupre   AND APPENDECTOMY  . COLONOSCOPY WITH PROPOFOL  last one 03-28-2018   dr pyrtle  . LASER ABLATION CONDOLAMATA N/A 12/20/2018   Procedure: LASER ABLATION OF CONDOLAMATA, EXAMINATION UNDER ANESTHESIA;  Surgeon: Karie Soda, MD;  Location: [facility];  Service: General;  Laterality: N/A;    Current Medications: Current Meds  Medication Sig  . amLODipine (NORVASC) 10 MG tablet TAKE 1 TABLET BY MOUTH EVERY DAY  . atorvastatin (LIPITOR) 10 MG tablet TAKE 1 TABLET BY MOUTH EVERY DAY  . carvedilol (COREG) 12.5 MG tablet TAKE 1 TABLET (12.5 MG TOTAL) BY MOUTH 2 (TWO) TIMES DAILY WITH A MEAL.  . citalopram (CELEXA) 40 MG tablet Take 1 tablet (40 mg total) by mouth daily.  . Continuous Blood Gluc Sensor (FREESTYLE LIBRE 14 DAY SENSOR) MISC 1 EACH BY DOES NOT APPLY ROUTE EVERY 14 (FOURTEEN) DAYS. DX CODE E11.51  .  dapagliflozin propanediol (FARXIGA) 5 MG TABS tablet Take 1 tablet (5 mg total) by mouth daily.  . fluticasone (FLONASE) 50 MCG/ACT nasal spray SPRAY 2 SPRAYS INTO EACH NOSTRIL EVERY DAY  . furosemide (LASIX) 40 MG tablet TAKE 1 TABLET BY MOUTH EVERY DAY  . Insulin Pen Needle (PEN NEEDLES) 32G X 6 MM MISC 1 each by Does not apply route daily. Use with Victoza once a day  . liraglutide (VICTOZA) 18 MG/3ML SOPN Inject 0.87mls (1.8mg  total) into the skin daily  . lisinopril (ZESTRIL) 40 MG tablet TAKE 1 TABLET BY MOUTH EVERY DAY  .  omeprazole (PRILOSEC) 20 MG capsule TAKE 1 CAPSULE BY MOUTH EVERY DAY  . ondansetron (ZOFRAN ODT) 4 MG disintegrating tablet Take 1 tablet (4 mg total) by mouth every 8 (eight) hours as needed for nausea or vomiting.  . sildenafil (REVATIO) 20 MG tablet Take 1 tablet (20 mg total) by mouth as needed.     Allergies:   Aleve [naproxen sodium], Other, Penicillins, and Shellfish allergy   Social History   Socioeconomic History  . Marital status: Married    Spouse name: Not on file  . Number of children: 3  . Years of education: Not on file  . Highest education level: Not on file  Occupational History  . 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 Occupation: Engineer, site business    Comment:    Tobacco Use  . Smoking status: Never Smoker  . Smokeless tobacco: Never Used  Vaping Use  . Vaping Use: Never used  Substance and Sexual Activity  . Alcohol use: Not Currently    Comment: rare  . Drug use: Never  . Sexual activity: Not on file  Other Topics Concern  . Not on file  Social History Narrative   Has living will   Wife is health care POA--- children are alternates   Would accept resuscitation   No prolonged tube feeds   Social Determinants of Health   Financial Resource Strain: Not on file  Food Insecurity: Not on file  Transportation Needs: Not on file  Physical Activity: Not on file  Stress: Not on file  Social Connections: Not on file     Family History: The patient's family history includes Arthritis in his mother; Chronic Renal Failure in his mother; Diabetes in his mother; Healthy in his brother, sister, and sister; Heart disease in his father; Hypertension in his mother; Parkinsonism in his father. There is no history of Cancer, Colon cancer, Esophageal cancer, Stomach cancer, or Rectal cancer.  ROS:   Please see the history of present illness.     All other systems reviewed and are  negative.  EKGs/Labs/Other Studies Reviewed:    The following studies were reviewed today:   EKG:  EKG is  ordered today.  The ekg ordered today demonstrates normal, normal ECG  Recent Labs: 03/31/2020: BUN 12; Creatinine, Ser 1.07; Potassium 3.5; Sodium 138 04/02/2020: Hemoglobin 14.7; Platelets 233.0  Recent Lipid Panel    Component Value Date/Time   CHOL 121 02/20/2019 1250   TRIG 102.0 02/20/2019 1250   HDL 38.80 (L) 02/20/2019 1250   CHOLHDL 3 02/20/2019 1250   VLDL 20.4 02/20/2019 1250   LDLCALC 61 02/20/2019 1250     Risk Assessment/Calculations:      Physical Exam:    VS:  BP 112/68 (BP Location: Right Arm, Patient Position: Sitting, Cuff Size: Large)   Pulse 74   Ht  5\' 8"  (1.727 m)   Wt 230 lb 4 oz (104.4 kg)   SpO2 98%   BMI 35.01 kg/m     Wt Readings from Last 3 Encounters:  04/06/20 230 lb 4 oz (104.4 kg)  04/02/20 226 lb (102.5 kg)  12/24/19 240 lb (108.9 kg)     GEN:  Well nourished, well developed in no acute distress HEENT: Normal NECK: No JVD; No carotid bruits LYMPHATICS: No lymphadenopathy CARDIAC: RRR, no murmurs, rubs, gallops RESPIRATORY:  Clear to auscultation without rales, wheezing or rhonchi  ABDOMEN: Soft, non-tender, non-distended MUSCULOSKELETAL:  No edema; No deformity  SKIN: Warm and dry NEUROLOGIC:  Alert and oriented x 3 PSYCHIATRIC:  Normal affect   ASSESSMENT:    1. Chest pain of uncertain etiology   2. Primary hypertension   3. Pure hypercholesterolemia    PLAN:    In order of problems listed above:  1. Chest pain, appears noncardiac in etiology with symptoms occurring in the context of vomiting and diarrhea.  Risk factors hypertension, diabetes.  Obtain echocardiogram to evaluate systolic and diastolic function.  We will hold off on stress test at this time as his symptoms appear noncardiac.  Consider stress testing if symptoms suggest cardiac etiology, or persist. 2. Hypertension, BP controlled.  Continue current  BP meds. 3. Hyperlipidemia, continue Lipitor.  Follow-up after echocardiogram     Medication Adjustments/Labs and Tests Ordered: Current medicines are reviewed at length with the patient today.  Concerns regarding medicines are outlined above.  Orders Placed This Encounter  Procedures  . EKG 12-Lead  . ECHOCARDIOGRAM COMPLETE   No orders of the defined types were placed in this encounter.   Patient Instructions  Medication Instructions:  Your physician recommends that you continue on your current medications as directed. Please refer to the Current Medication list given to you today.  *If you need a refill on your cardiac medications before your next appointment, please call your pharmacy*   Lab Work: None Ordered If you have labs (blood work) drawn today and your tests are completely normal, you will receive your results only by: Marland Kitchen MyChart Message (if you have MyChart) OR . A paper copy in the mail If you have any lab test that is abnormal or we need to change your treatment, we will call you to review the results.   Testing/Procedures:  Your physician has requested that you have an echocardiogram. Echocardiography is a painless test that uses sound waves to create images of your heart. It provides your doctor with information about the size and shape of your heart and how well your heart's chambers and valves are working. This procedure takes approximately one hour. There are no restrictions for this procedure.    Follow-Up: At [facility], you and your health needs are our priority.  As part of our continuing mission to provide you with exceptional heart care, we have created designated Provider Care Teams.  These Care Teams include your primary Cardiologist (physician) and Advanced Practice Providers (APPs -  Physician Assistants and Nurse Practitioners) who all work together to provide you with the care you need, when you need it.  We recommend signing up for the  patient portal called "MyChart".  Sign up information is provided on this After Visit Summary.  MyChart is used to connect with patients for Virtual Visits (Telemedicine).  Patients are able to view lab/test results, encounter notes, upcoming appointments, etc.  Non-urgent messages can be sent to your provider as well.  To learn more about what you can do with MyChart, go to [email or web address].    Your next appointment:   Follow up after Echo   The format for your next appointment:   In Person  Provider:   Debbe Odea, MD   Other Instructions      Signed, Debbe Odea, MD  04/06/2020 12:55 PM    [facility]

## 2020-04-06 NOTE — Patient Instructions (Signed)

## 2020-04-10 ENCOUNTER — Other Ambulatory Visit: Payer: Self-pay | Admitting: Internal Medicine

## 2020-04-11 ENCOUNTER — Other Ambulatory Visit: Payer: Self-pay | Admitting: Internal Medicine

## 2020-04-13 ENCOUNTER — Other Ambulatory Visit: Payer: Self-pay | Admitting: Internal Medicine

## 2020-04-15 ENCOUNTER — Other Ambulatory Visit: Payer: Self-pay | Admitting: Internal Medicine

## 2020-04-15 ENCOUNTER — Telehealth: Payer: Self-pay | Admitting: Internal Medicine

## 2020-04-15 NOTE — Telephone Encounter (Signed)
Paitents insurance company is calling in about his glucose monitor ordered is not covered his insurance plan and they are needing a medical necessary letter or we would have to order the one that his insurance company will cover which is One Touch Life Scan . Please advise. EM

## 2020-04-16 ENCOUNTER — Other Ambulatory Visit: Payer: Self-pay | Admitting: Internal Medicine

## 2020-04-16 MED ORDER — DEXCOM G6 TRANSMITTER MISC: 1.0000 | 3 refills | Status: DC

## 2020-04-16 MED ORDER — DEXCOM G6 SENSOR MISC: 1.0000 | 3 refills | Status: DC

## 2020-04-16 MED ORDER — DEXCOM G6 RECEIVER DEVI: 1.0000 | Freq: Once | 0 refills | Status: AC

## 2020-04-16 MED ORDER — ONETOUCH VERIO VI STRP: ORAL_STRIP | 12 refills | Status: DC

## 2020-04-16 MED ORDER — ONETOUCH VERIO W/DEVICE KIT: 1.0000 | PACK | Freq: Once | 0 refills | Status: AC

## 2020-04-16 NOTE — Telephone Encounter (Signed)
I sent in the [facility]

## 2020-04-16 NOTE — Addendum Note (Signed)
Addended by: Pilar Grammes on: 04/16/2020 10:40 AM   Modules accepted: Orders

## 2020-04-24 ENCOUNTER — Other Ambulatory Visit: Payer: Self-pay

## 2020-04-24 ENCOUNTER — Ambulatory Visit (INDEPENDENT_AMBULATORY_CARE_PROVIDER_SITE_OTHER): Payer: Medicare HMO

## 2020-04-24 DIAGNOSIS — R079 Chest pain, unspecified: Secondary | ICD-10-CM | POA: Diagnosis not present

## 2020-04-24 LAB — ECHOCARDIOGRAM COMPLETE
AR max vel: 4.25 cm2
AV Area VTI: 4.18 cm2
AV Area mean vel: 3.75 cm2
AV Mean grad: 4 mmHg
AV Peak grad: 6.7 mmHg
Ao pk vel: 1.29 m/s
Area-P 1/2: 3.5 cm2
Single Plane A4C EF: 57.1 %

## 2020-04-24 MED ORDER — PERFLUTREN LIPID MICROSPHERE
1.0000 mL | INTRAVENOUS | Status: AC | PRN
  Administered 2020-04-24: 2 mL via INTRAVENOUS

## 2020-05-01 ENCOUNTER — Ambulatory Visit: Payer: Medicare HMO | Admitting: Cardiology

## 2020-05-05 DIAGNOSIS — E119 Type 2 diabetes mellitus without complications: Secondary | ICD-10-CM | POA: Diagnosis not present

## 2020-05-06 ENCOUNTER — Encounter: Payer: Medicare HMO | Admitting: Internal Medicine

## 2020-05-20 DIAGNOSIS — G4733 Obstructive sleep apnea (adult) (pediatric): Secondary | ICD-10-CM | POA: Diagnosis not present

## 2020-05-28 DIAGNOSIS — H43391 Other vitreous opacities, right eye: Secondary | ICD-10-CM | POA: Diagnosis not present

## 2020-06-04 MED ORDER — TADALAFIL 20 MG PO TABS: 10.0000 mg | ORAL_TABLET | ORAL | 11 refills | Status: DC | PRN

## 2020-06-04 MED ORDER — OZEMPIC (0.25 OR 0.5 MG/DOSE) 2 MG/1.5ML ~~LOC~~ SOPN: 0.5000 mg | PEN_INJECTOR | SUBCUTANEOUS | 11 refills | Status: DC

## 2020-06-05 DIAGNOSIS — E119 Type 2 diabetes mellitus without complications: Secondary | ICD-10-CM | POA: Diagnosis not present

## 2020-06-25 DIAGNOSIS — H43391 Other vitreous opacities, right eye: Secondary | ICD-10-CM | POA: Diagnosis not present

## 2020-07-01 ENCOUNTER — Encounter: Payer: Self-pay | Admitting: Internal Medicine

## 2020-07-01 ENCOUNTER — Ambulatory Visit (INDEPENDENT_AMBULATORY_CARE_PROVIDER_SITE_OTHER): Payer: Medicare HMO | Admitting: Internal Medicine

## 2020-07-01 ENCOUNTER — Other Ambulatory Visit: Payer: Self-pay

## 2020-07-01 VITALS — BP 108/72 | HR 70 | Temp 96.8°F | Ht 68.75 in | Wt 238.0 lb

## 2020-07-01 DIAGNOSIS — F39 Unspecified mood [affective] disorder: Secondary | ICD-10-CM

## 2020-07-01 DIAGNOSIS — Z0000 Encounter for general adult medical examination without abnormal findings: Secondary | ICD-10-CM

## 2020-07-01 DIAGNOSIS — G4733 Obstructive sleep apnea (adult) (pediatric): Secondary | ICD-10-CM

## 2020-07-01 DIAGNOSIS — E1159 Type 2 diabetes mellitus with other circulatory complications: Secondary | ICD-10-CM | POA: Diagnosis not present

## 2020-07-01 DIAGNOSIS — R69 Illness, unspecified: Secondary | ICD-10-CM | POA: Diagnosis not present

## 2020-07-01 DIAGNOSIS — I10 Essential (primary) hypertension: Secondary | ICD-10-CM | POA: Diagnosis not present

## 2020-07-01 DIAGNOSIS — Z125 Encounter for screening for malignant neoplasm of prostate: Secondary | ICD-10-CM

## 2020-07-01 DIAGNOSIS — K219 Gastro-esophageal reflux disease without esophagitis: Secondary | ICD-10-CM | POA: Diagnosis not present

## 2020-07-01 LAB — LIPID PANEL
Cholesterol: 102 mg/dL (ref 0–200)
HDL: 37.4 mg/dL — ABNORMAL LOW (ref >39.00)
LDL Cholesterol: 48 mg/dL (ref 0–99)
NonHDL: 64.66
Total CHOL/HDL Ratio: 3
Triglycerides: 84 mg/dL (ref 0.0–149.0)
VLDL: 16.8 mg/dL (ref 0.0–40.0)

## 2020-07-01 LAB — CBC
HCT: 44.9 % (ref 39.0–52.0)
Hemoglobin: 14.9 g/dL (ref 13.0–17.0)
MCHC: 33.2 g/dL (ref 30.0–36.0)
MCV: 91.2 fl (ref 78.0–100.0)
Platelets: 177 10*3/uL (ref 150.0–400.0)
RBC: 4.92 Mil/uL (ref 4.22–5.81)
RDW: 14.5 % (ref 11.5–15.5)
WBC: 6.2 10*3/uL (ref 4.0–10.5)

## 2020-07-01 LAB — COMPREHENSIVE METABOLIC PANEL
ALT: 26 U/L (ref 0–53)
AST: 17 U/L (ref 0–37)
Albumin: 4 g/dL (ref 3.5–5.2)
Alkaline Phosphatase: 55 U/L (ref 39–117)
BUN: 11 mg/dL (ref 6–23)
CO2: 30 mEq/L (ref 19–32)
Calcium: 8.9 mg/dL (ref 8.4–10.5)
Chloride: 101 mEq/L (ref 96–112)
Creatinine, Ser: 0.88 mg/dL (ref 0.40–1.50)
GFR: 88.59 mL/min (ref >60.00)
Glucose, Bld: 141 mg/dL — ABNORMAL HIGH (ref 70–99)
Potassium: 3.8 mEq/L (ref 3.5–5.1)
Sodium: 139 mEq/L (ref 135–145)
Total Bilirubin: 1.3 mg/dL — ABNORMAL HIGH (ref 0.2–1.2)
Total Protein: 7 g/dL (ref 6.0–8.3)

## 2020-07-01 LAB — PSA, MEDICARE: PSA: 1.05 ng/ml (ref 0.10–4.00)

## 2020-07-01 LAB — HM DIABETES FOOT EXAM

## 2020-07-01 LAB — HEMOGLOBIN A1C: Hgb A1c MFr Bld: 6.7 % — ABNORMAL HIGH (ref 4.6–6.5)

## 2020-07-01 MED ORDER — SEMAGLUTIDE (1 MG/DOSE) 4 MG/3ML ~~LOC~~ SOPN: 1.0000 mg | PEN_INJECTOR | SUBCUTANEOUS | 11 refills | Status: DC

## 2020-07-01 NOTE — Assessment & Plan Note (Signed)
Does well with CPAP 

## 2020-07-01 NOTE — Assessment & Plan Note (Signed)
BP Readings from Last 3 Encounters:  07/01/20 108/72  04/06/20 112/68  04/02/20 106/70   Good control on lisinopril, amlodipine, carvedilol and furosemide Due for labs

## 2020-07-01 NOTE — Assessment & Plan Note (Signed)
I have personally reviewed the Medicare Annual Wellness questionnaire and have noted 1. The patient's medical and social history 2. Their use of alcohol, tobacco or illicit drugs 3. Their current medications and supplements 4. The patient's functional ability including ADL's, fall risks, home safety risks and hearing or visual             impairment. 5. Diet and physical activities 6. Evidence for depression or mood disorders  The patients weight, height, BMI and visual acuity have been recorded in the chart I have made referrals, counseling and provided education to the patient based review of the above and I have provided the pt with a written personalized care plan for preventive services.  I have provided you with a copy of your personalized plan for preventive services. Please take the time to review along with your updated medication list.  Colon due 2025 Will check PSA COVID booster and flu vaccine in the fall Td if any injury Consider shingrix at the pharmacy Needs to increase exercise

## 2020-07-01 NOTE — Progress Notes (Signed)
Hearing Screening   125Hz  250Hz  500Hz  1000Hz  2000Hz  3000Hz  4000Hz  6000Hz  8000Hz   Right ear:           Left ear:           Comments: Has hearing aids. Not wearing them today.  Vision Screening Comments: April 2022

## 2020-07-01 NOTE — Assessment & Plan Note (Signed)
Seems to still have acceptable control Will continue farxiga and try to increase the semaglutide (or liraglutide)

## 2020-07-01 NOTE — Assessment & Plan Note (Signed)
BMI still 35.4 with diabetes, HTN, OSA, etc Will try increasing the semaglutide to 1mg  weekly and titrate up from there if affordable

## 2020-07-01 NOTE — Progress Notes (Signed)
Subjective:    Patient ID: James Camacho, male    DOB: 1950/07/31, 70 y.o.   MRN: [id number]  HPI Here for Medicare wellness visit and follow up of chronic medical conditions This visit occurred during the SARS-CoV-2 public health emergency.  Safety protocols were in place, including screening questions prior to the visit, additional usage of staff PPE, and extensive cleaning of exam room while observing appropriate contact time as indicated for disinfecting solutions.   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 1/2 mile at this point. No resistance work Independent with instrumental ADLs No memory problems  Doing well with victoza now--not keeping his appetite down Using this lately---semaglutide was more money Sounds like he was on 1.8mg  daily Continues on farxiga Has been using the Freestyle LIbre---mostly staying under 150 average Maximum is 200 post prandial No hypoglycemic reactions No numbness or burning in hands or feet  Mood is good Back at work---legal issues behind him Continues on the citalopram  No chest pain No SOB No dizziness or syncope No palpitations Will have some calf swelling at the end of the day--wears support socks and they help  Takes the omeprazole daily No heartburn on it Will occasionally miss days--no symptoms No dysphagia   Current Outpatient Medications on File Prior to Visit  Medication Sig Dispense Refill  . amLODipine (NORVASC) 10 MG tablet TAKE 1 TABLET BY MOUTH EVERY DAY 90 tablet 3  . atorvastatin (LIPITOR) 10 MG tablet TAKE 1 TABLET BY MOUTH EVERY DAY 90 tablet 3  . carvedilol (COREG) 12.5 MG tablet TAKE 1 TABLET (12.5 MG TOTAL) BY MOUTH 2 (TWO) TIMES DAILY WITH A MEAL.  180 tablet 3  . citalopram (CELEXA) 40 MG tablet Take 1 tablet (40 mg total) by mouth daily. 90 tablet 3  . Continuous Blood Gluc Sensor (FREESTYLE LIBRE 2 SENSOR) MISC 1 each by Does not apply route every 14 (fourteen) days.    . dapagliflozin propanediol (FARXIGA) 5 MG TABS tablet Take 1 tablet (5 mg total) by mouth daily. 30 tablet 5  . fluticasone (FLONASE) 50 MCG/ACT nasal spray SPRAY 2 SPRAYS INTO EACH NOSTRIL EVERY DAY 48 mL 3  . furosemide (LASIX) 40 MG tablet TAKE 1 TABLET BY MOUTH EVERY DAY 90 tablet 3  . glucose blood (ONETOUCH VERIO) test strip Use to check blood sugar once a day DX CODE E11.51 100 each 12  . Insulin Pen Needle (PEN NEEDLES) 32G X 6 MM MISC 1 each by Does not apply route daily. Use with Victoza once a day 50 each 11  . lisinopril (ZESTRIL) 40 MG tablet TAKE 1 TABLET BY MOUTH EVERY DAY 90 tablet 3  . omeprazole (PRILOSEC) 20 MG capsule TAKE 1 CAPSULE BY MOUTH EVERY DAY 90 capsule 3  . ondansetron (ZOFRAN ODT) 4 MG disintegrating tablet Take 1 tablet (4 mg total) by mouth every 8 (eight) hours as needed for nausea or vomiting. 12 tablet 0  . Semaglutide,0.25 or 0.5MG /DOS, (OZEMPIC, 0.25 OR 0.5 MG/DOSE,) 2 MG/1.5ML SOPN Inject 0.5 mg into the skin once a week. 1.5 mL 11  . tadalafil (CIALIS) 20 MG tablet Take 0.5-1 tablets (10-20 mg total) by mouth every other day as needed for erectile dysfunction. 10 tablet 11   No current facility-administered medications on file prior to visit.  Allergies  Allergen Reactions  . Aleve [Naproxen Sodium] Hives  . Other     Pt can only take Tylenol for pain- anything prescribed that he has tried breaks him out in a rash.   . Penicillins Rash  . Shellfish Allergy Hives    Past Medical History:  Diagnosis Date  . Anal condylomata   . Anxiety   . Depression   . ED (erectile dysfunction)   . History of adenomatous polyp of colon   . History of exercise stress test    ETT 04-17-2007 (in epic)  negative  . Hyperlipidemia     LDL in 120's  . Hypertension    followed by pcp  . OSA (obstructive sleep apnea)   . OSA on CPAP    study in epic 08-11-2017 moderate osa  . Seasonal allergic rhinitis   . Type 2 diabetes mellitus ([place])    followed by pcp  . Wears glasses   . Wears hearing aid in both ears     Past Surgical History:  Procedure Laterality Date  . CATARACT EXTRACTION W/PHACO Right 06/28/2017   Procedure: CATARACT EXTRACTION PHACO AND INTRAOCULAR LENS PLACEMENT ([place]) RIGHT;  Surgeon: Leandrew Koyanagi, MD;  Location: [place];  Service: Ophthalmology;  Laterality: Right;  . CATARACT EXTRACTION W/PHACO Left 07/19/2017   Procedure: CATARACT EXTRACTION PHACO AND INTRAOCULAR LENS PLACEMENT ([place]) LEFT TOPICAL;  Surgeon: Leandrew Koyanagi, MD;  Location: [place];  Service: Ophthalmology;  Laterality: Left;  IVA TOPICALLEFTDIABETIC  . CHOLECYSTECTOMY OPEN  1980s   dr  Sharlet [place]   AND APPENDECTOMY  . COLONOSCOPY WITH PROPOFOL  last one 03-28-2018   dr pyrtle  . LASER ABLATION CONDOLAMATA N/A 12/20/2018   Procedure: LASER ABLATION OF CONDOLAMATA, EXAMINATION UNDER ANESTHESIA;  Surgeon: Michael Boston, MD;  Location: [place];  Service: General;  Laterality: N/A;    Family History  Problem Relation Age of Onset  . Diabetes Mother   . Arthritis Mother   . Hypertension Mother   . Chronic Renal Failure Mother   . Parkinsonism Father   . Heart disease Father   . Healthy Sister   . Healthy Brother   . Healthy Sister   . Cancer Neg Hx        no colon or prostate cancer  . Colon cancer Neg Hx   . Esophageal cancer Neg Hx   . Stomach cancer Neg Hx   . Rectal cancer Neg Hx     Social History   Socioeconomic History  . Marital status: Married    Spouse name: Not on file  . Number of children: 3  . Years of education: Not on file  . Highest education level: Not on file  Occupational History  . 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 Occupation: Engineer, site business    Comment:    Tobacco Use  . Smoking status: Never Smoker  . Smokeless tobacco: Never Used  Vaping Use  . Vaping Use: Never used  Substance and Sexual Activity  . Alcohol use: Not Currently    Comment: rare  . Drug use: Never  . Sexual activity: Not on file  Other Topics Concern  . Not on file  Social History Narrative   Has living will   Wife is health care POA--- children are alternates   Would accept resuscitation   No prolonged tube feeds   Social Determinants of Radio broadcast assistant  Strain: Not on file  Food Insecurity: Not on file  Transportation Needs: Not on file  Physical Activity: Not on file  Stress: Not on file  Social Connections: Not on file  Intimate Partner Violence: Not on file   Review of Systems Sleeps well with CPAP nightly Appetite is still fine Weight back up slightly Wears seat belt Teeth okay Bowels move fine--no blood Voids well---some urgency (good flow). Rare nocturia Satisfied with cialis.--sometimes feels bad after though No sig back or joint pains No suspicious skin lesions. 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  HENT:     Mouth/Throat:     Comments: No lesions Eyes:     Conjunctiva/sclera: Conjunctivae normal.     Pupils: Pupils are equal, round, and reactive to light.  Cardiovascular:     Rate and Rhythm: Normal rate and regular rhythm.     Pulses: Normal pulses.     Heart sounds: No murmur heard. No gallop.   Pulmonary:     Effort: Pulmonary effort is normal.     Breath sounds: Normal breath sounds. No wheezing or rales.  Abdominal:     Palpations: Abdomen is soft.     Tenderness: There is no abdominal tenderness.  Musculoskeletal:     Cervical back: Neck supple.     Right lower leg: No edema.     Left lower leg: No edema.  Lymphadenopathy:     Cervical: No cervical  adenopathy.  Skin:    General: Skin is warm.     Findings: No rash.     Comments: No foot lesions  Neurological:     Mental Status: He is alert and oriented to person, place, and time.     Comments: President---"Biden, Trump, Obama" [id number] D-l-[id number]-w Recall 3/3  Normal sensation in feet  Psychiatric:        Mood and Affect: Mood normal.        Behavior: Behavior normal.            Assessment & Plan:

## 2020-07-01 NOTE — Assessment & Plan Note (Signed)
Mood is good on the citalopram--will consider weaning over time

## 2020-07-01 NOTE — Assessment & Plan Note (Signed)
Discussed trying to wean omeprazole to every other day

## 2020-07-04 ENCOUNTER — Other Ambulatory Visit: Payer: Self-pay | Admitting: Internal Medicine

## 2020-07-07 DIAGNOSIS — E119 Type 2 diabetes mellitus without complications: Secondary | ICD-10-CM | POA: Diagnosis not present

## 2020-07-16 ENCOUNTER — Other Ambulatory Visit: Payer: Self-pay | Admitting: Internal Medicine

## 2020-08-05 DIAGNOSIS — H524 Presbyopia: Secondary | ICD-10-CM | POA: Diagnosis not present

## 2020-08-07 ENCOUNTER — Telehealth: Payer: Self-pay

## 2020-08-16 ENCOUNTER — Other Ambulatory Visit: Payer: Self-pay | Admitting: Internal Medicine

## 2020-08-16 DIAGNOSIS — J0110 Acute frontal sinusitis, unspecified: Secondary | ICD-10-CM

## 2020-08-18 DIAGNOSIS — G4733 Obstructive sleep apnea (adult) (pediatric): Secondary | ICD-10-CM | POA: Diagnosis not present

## 2020-08-20 NOTE — Telephone Encounter (Signed)
Erroneous encounter

## 2020-08-27 ENCOUNTER — Other Ambulatory Visit: Payer: Self-pay | Admitting: Internal Medicine

## 2020-09-11 DIAGNOSIS — H00015 Hordeolum externum left lower eyelid: Secondary | ICD-10-CM | POA: Diagnosis not present

## 2020-09-22 ENCOUNTER — Other Ambulatory Visit: Payer: Self-pay | Admitting: Internal Medicine

## 2020-10-02 DIAGNOSIS — E119 Type 2 diabetes mellitus without complications: Secondary | ICD-10-CM | POA: Diagnosis not present

## 2020-10-05 MED ORDER — SEMAGLUTIDE (2 MG/DOSE) 8 MG/3ML ~~LOC~~ SOPN: 2.0000 mg | PEN_INJECTOR | SUBCUTANEOUS | 11 refills | Status: DC

## 2020-11-16 DIAGNOSIS — G4733 Obstructive sleep apnea (adult) (pediatric): Secondary | ICD-10-CM | POA: Diagnosis not present

## 2020-12-01 ENCOUNTER — Other Ambulatory Visit: Payer: Self-pay | Admitting: Internal Medicine

## 2020-12-02 MED ORDER — DULAGLUTIDE 1.5 MG/0.5ML ~~LOC~~ SOAJ: 1.5000 mg | SUBCUTANEOUS | 11 refills | Status: DC

## 2021-01-01 ENCOUNTER — Ambulatory Visit: Payer: Medicare HMO | Admitting: Internal Medicine

## 2021-01-08 ENCOUNTER — Encounter: Payer: Self-pay | Admitting: Internal Medicine

## 2021-01-08 ENCOUNTER — Other Ambulatory Visit: Payer: Self-pay

## 2021-01-08 ENCOUNTER — Ambulatory Visit (INDEPENDENT_AMBULATORY_CARE_PROVIDER_SITE_OTHER): Payer: Medicare HMO | Admitting: Internal Medicine

## 2021-01-08 VITALS — BP 108/66 | HR 74 | Temp 97.5°F | Ht 69.0 in | Wt 226.0 lb

## 2021-01-08 DIAGNOSIS — F39 Unspecified mood [affective] disorder: Secondary | ICD-10-CM | POA: Diagnosis not present

## 2021-01-08 DIAGNOSIS — E1159 Type 2 diabetes mellitus with other circulatory complications: Secondary | ICD-10-CM

## 2021-01-08 DIAGNOSIS — R69 Illness, unspecified: Secondary | ICD-10-CM | POA: Diagnosis not present

## 2021-01-08 DIAGNOSIS — I10 Essential (primary) hypertension: Secondary | ICD-10-CM | POA: Diagnosis not present

## 2021-01-08 LAB — POCT GLYCOSYLATED HEMOGLOBIN (HGB A1C): Hemoglobin A1C: 5.9 % — AB (ref 4.0–5.6)

## 2021-01-08 MED ORDER — CITALOPRAM HYDROBROMIDE 20 MG PO TABS: 20.0000 mg | ORAL_TABLET | Freq: Every day | ORAL | 3 refills | Status: DC

## 2021-01-08 NOTE — Assessment & Plan Note (Signed)
Lab Results  Component Value Date   HGBA1C 5.9 (A) 01/08/2021   Excellent control Back on semaglutide and still on farxiga

## 2021-01-08 NOTE — Progress Notes (Signed)
Subjective:    Patient ID: James Camacho, male    DOB: December 26, 1950, 70 y.o.   MRN: [id number]  HPI Here for follow up of diabetes and other chronic health conditions This visit occurred during the SARS-CoV-2 public health emergency.  Safety protocols were in place, including screening questions prior to the visit, additional usage of staff PPE, and extensive cleaning of exam room while observing appropriate contact time as indicated for disinfecting solutions.   Had to switch to trulicity due to availability Now back to semaglutide Has lost some weight Checking sugars with Freestyle libre---average 91-104 No hypoglycemic reactions of concern---did feel it once after missing breakfast one day  Has had some back/hip pain after prolonged sitting Some shooting left leg pain Can walk it out after a couple of minutes Tylenol some help  Mood has been good Ready to try lower dose of citalopram  No chest pain No SOB No dizziness or syncope  Current Outpatient Medications on File Prior to Visit  Medication Sig Dispense Refill   amLODipine (NORVASC) 10 MG tablet TAKE 1 TABLET BY MOUTH EVERY DAY 90 tablet 3   atorvastatin (LIPITOR) 10 MG tablet TAKE 1 TABLET BY MOUTH EVERY DAY 90 tablet 3   carvedilol (COREG) 12.5 MG tablet TAKE 1 TABLET (12.5 MG TOTAL) BY MOUTH 2 (TWO) TIMES DAILY WITH A MEAL. 180 tablet 3   citalopram (CELEXA) 40 MG tablet TAKE 1 TABLET BY MOUTH EVERY DAY 90 tablet 3   Continuous Blood Gluc Sensor (FREESTYLE LIBRE 2 SENSOR) MISC 1 each by Does not apply route every 14 (fourteen) days.     FARXIGA 5 MG TABS tablet TAKE 1 TABLET BY MOUTH EVERY DAY 30 tablet 5   fluticasone (FLONASE) 50 MCG/ACT nasal spray SPRAY 2 SPRAYS INTO EACH NOSTRIL EVERY DAY 48 mL 3   furosemide (LASIX) 40 MG tablet TAKE 1 TABLET BY MOUTH EVERY DAY 90 tablet 3   glucose blood (ONETOUCH VERIO) test strip Use to check blood sugar once a day DX CODE E11.51 100 each 12   Insulin Pen Needle (PEN  NEEDLES) 32G X 6 MM MISC 1 each by Does not apply route daily. Use with Victoza once a day 50 each 11   lisinopril (ZESTRIL) 40 MG tablet TAKE 1 TABLET BY MOUTH EVERY DAY 90 tablet 3   omeprazole (PRILOSEC) 20 MG capsule TAKE 1 CAPSULE BY MOUTH EVERY DAY 90 capsule 3   ondansetron (ZOFRAN ODT) 4 MG disintegrating tablet Take 1 tablet (4 mg total) by mouth every 8 (eight) hours as needed for nausea or vomiting. 12 tablet 0   Semaglutide, 1 MG/DOSE, (OZEMPIC, 1 MG/DOSE,) 4 MG/3ML SOPN Inject 1 mg into the skin once a week.     tadalafil (CIALIS) 20 MG tablet Take 0.5-1 tablets (10-20 mg total) by mouth every other day as needed for erectile dysfunction. 10 tablet 11   No current facility-administered medications on file prior to visit.    Allergies  Allergen Reactions   Aleve [Naproxen Sodium] Hives   Other     Pt can only take Tylenol for pain- anything prescribed that he has tried breaks him out in a rash.    Penicillins Rash   Shellfish Allergy Hives    Past Medical History:  Diagnosis Date   Anal condylomata    Anxiety    Depression    ED (erectile dysfunction)    History of adenomatous polyp of colon    History of exercise stress test  ETT 04-17-2007 (in epic)  negative   Hyperlipidemia    LDL in 120's   Hypertension    followed by pcp   OSA (obstructive sleep apnea)    OSA on CPAP    study in epic 08-11-2017 moderate osa   Seasonal allergic rhinitis    Type 2 diabetes mellitus ([place])    followed by pcp   Wears glasses    Wears hearing aid in both ears     Past Surgical History:  Procedure Laterality Date   CATARACT EXTRACTION W/PHACO Right 06/28/2017   Procedure: CATARACT EXTRACTION PHACO AND INTRAOCULAR LENS PLACEMENT ([place]) RIGHT;  Surgeon: Leandrew Koyanagi, MD;  Location: [facility];  Service: Ophthalmology;  Laterality: Right;   CATARACT EXTRACTION W/PHACO Left 07/19/2017   Procedure: CATARACT EXTRACTION PHACO AND INTRAOCULAR LENS PLACEMENT ([place]) LEFT  TOPICAL;  Surgeon: Leandrew Koyanagi, MD;  Location: [facility];  Service: Ophthalmology;  Laterality: Left;  IVA TOPICALLEFTDIABETIC   CHOLECYSTECTOMY OPEN  1980s   dr  Sharlet Salina   AND APPENDECTOMY   COLONOSCOPY WITH PROPOFOL  last one 03-28-2018   dr Hilarie Fredrickson   LASER ABLATION CONDOLAMATA N/A 12/20/2018   Procedure: LASER ABLATION OF CONDOLAMATA, EXAMINATION UNDER ANESTHESIA;  Surgeon: Michael Boston, MD;  Location: [facility];  Service: General;  Laterality: N/A;    Family History  Problem Relation Age of Onset   Diabetes Mother    Arthritis Mother    Hypertension Mother    Chronic Renal Failure Mother    Parkinsonism Father    Heart disease Father    Healthy Sister    Healthy Brother    Healthy Sister    Cancer Neg Hx        no colon or prostate cancer   Colon cancer Neg Hx    Esophageal cancer Neg Hx    Stomach cancer Neg Hx    Rectal cancer Neg Hx     Social History   Socioeconomic History   Marital status: Married    Spouse name: Not on file   Number of children: 3   Years of education: Not on file   Highest education level: Not on file  Occupational History   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   Occupation: Engineer, site business    Comment:    Tobacco Use   Smoking status: Never   Smokeless tobacco: Never  Scientific laboratory technician Use: Never used  Substance and Sexual Activity   Alcohol use: Not Currently    Comment: rare   Drug use: Never   Sexual activity: Not on file  Other Topics Concern   Not on file  Social History Narrative   Has living will   Wife is health care POA--- children are alternates   Would accept resuscitation   No prolonged tube feeds   Social Determinants of Health   Financial Resource Strain: Not on file  Food Insecurity: Not on file  Transportation Needs: Not on file  Physical Activity: Not on file  Stress: Not on file  Social Connections:  Not on file  Intimate Partner Violence: Not on file   Review of Systems Some stress with wife--worsening dementia Sleeps okay     Objective:   Physical Exam Constitutional:      Appearance: Normal appearance.  Cardiovascular:     Rate and Rhythm: Normal rate and regular rhythm.     Pulses: Normal pulses.  Heart sounds: No murmur heard.   No gallop.  Pulmonary:     Effort: Pulmonary effort is normal.     Breath sounds: Normal breath sounds. No wheezing or rales.     Comments: Prominent xiphoid (he asked me to check) Musculoskeletal:     Cervical back: Neck supple.     Right lower leg: No edema.     Left lower leg: No edema.  Lymphadenopathy:     Cervical: No cervical adenopathy.  Neurological:     Mental Status: He is alert.  Psychiatric:        Mood and Affect: Mood normal.        Behavior: Behavior normal.           Assessment & Plan:

## 2021-01-08 NOTE — Assessment & Plan Note (Signed)
Doing well now Will cut citalopram to 20mg 

## 2021-01-08 NOTE — Patient Instructions (Signed)
Please decrease the citalopram to 20mg  daily

## 2021-01-08 NOTE — Assessment & Plan Note (Signed)
BP Readings from Last 3 Encounters:  01/08/21 108/66  07/01/20 108/72  04/06/20 112/68   Good control on carvedilol, amlodipine, lisinopril

## 2021-01-20 ENCOUNTER — Other Ambulatory Visit: Payer: Self-pay | Admitting: Internal Medicine

## 2021-02-02 DIAGNOSIS — E119 Type 2 diabetes mellitus without complications: Secondary | ICD-10-CM | POA: Diagnosis not present

## 2021-02-11 ENCOUNTER — Encounter: Payer: Self-pay | Admitting: Internal Medicine

## 2021-02-15 DIAGNOSIS — G4733 Obstructive sleep apnea (adult) (pediatric): Secondary | ICD-10-CM | POA: Diagnosis not present

## 2021-03-05 DIAGNOSIS — E119 Type 2 diabetes mellitus without complications: Secondary | ICD-10-CM | POA: Diagnosis not present

## 2021-03-12 IMAGING — CR DG CHEST 2V
1 series · 2 of 2 positions shown · non-contrast
Comparison: Chest radiograph dated 03/25/2019.

CLINICAL DATA: 69-year-old male with shortness of breath.

EXAM:
CHEST - 2 VIEW

[Series 1: w chest pa · 0.14mm/px · 2 of 2 slices shown]
[im 1/2]
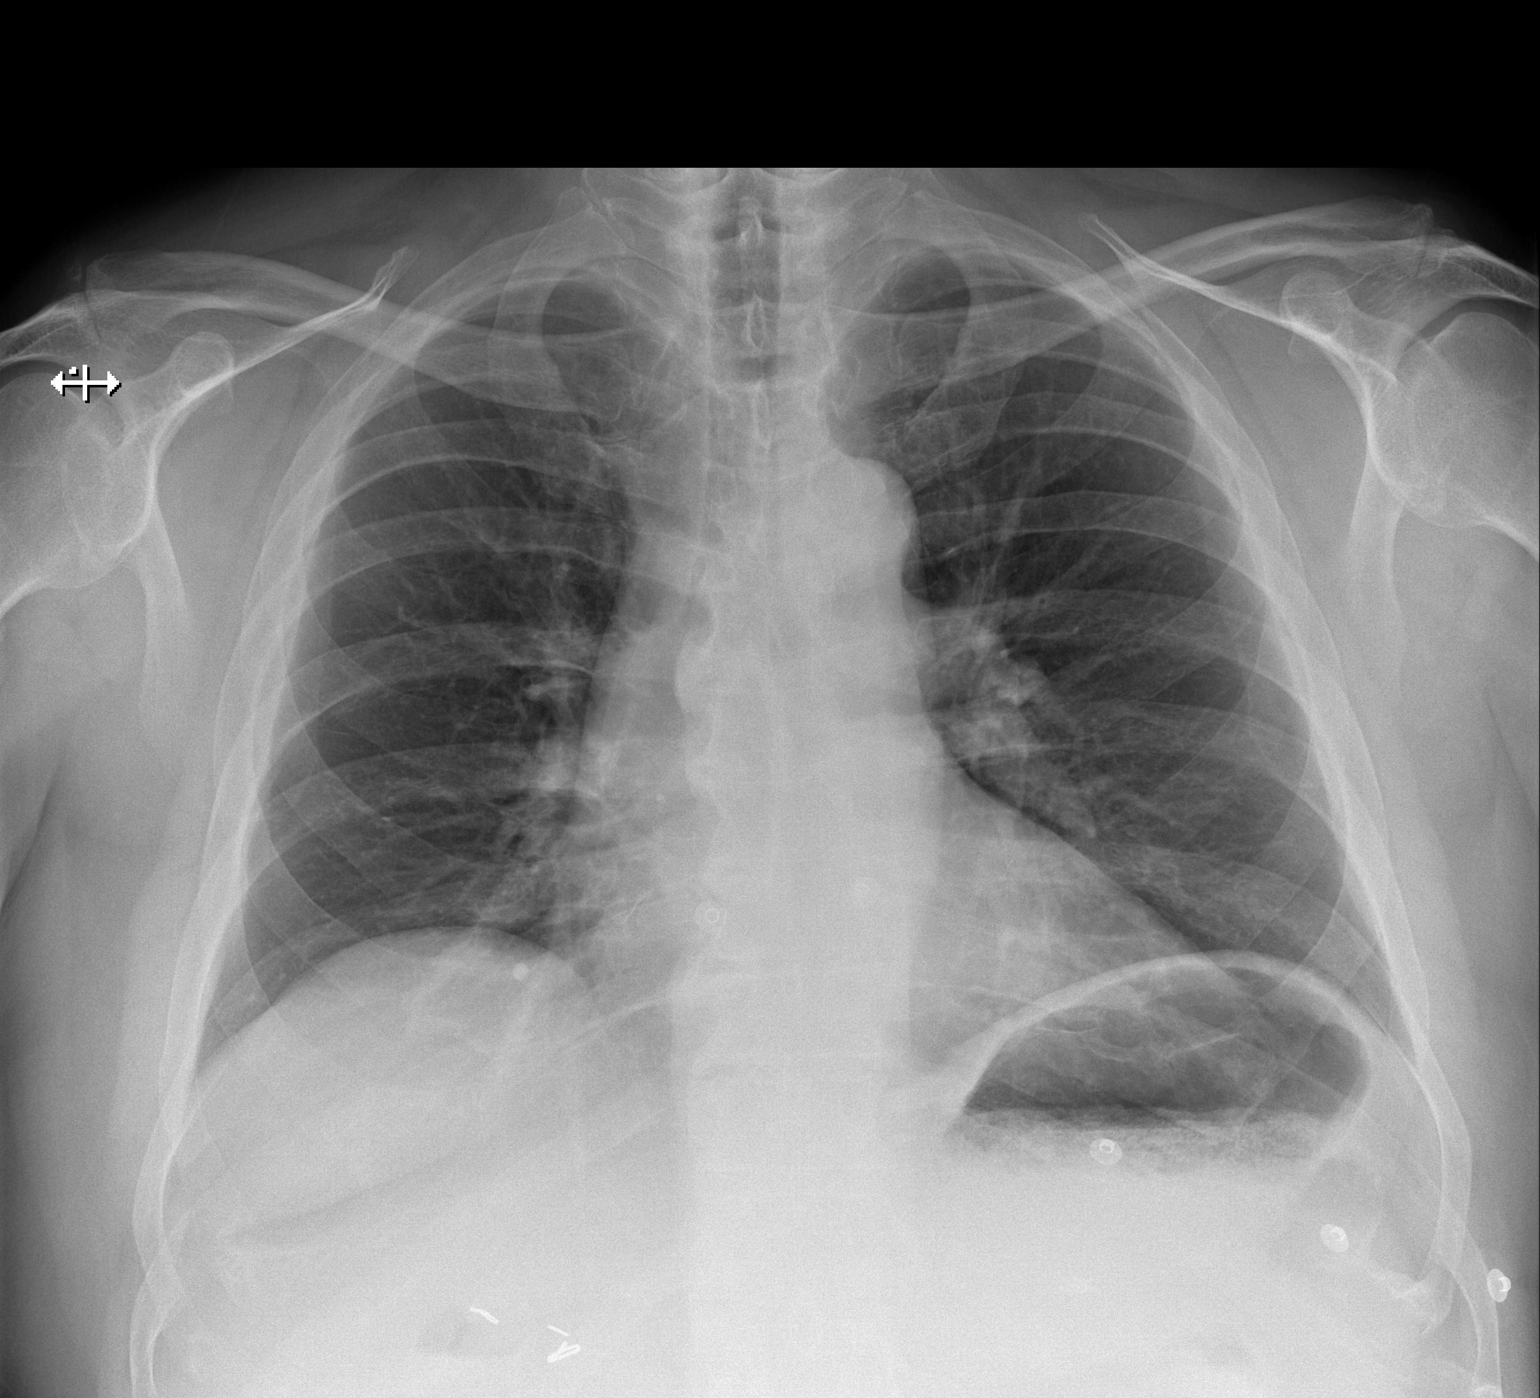
[im 2/2]
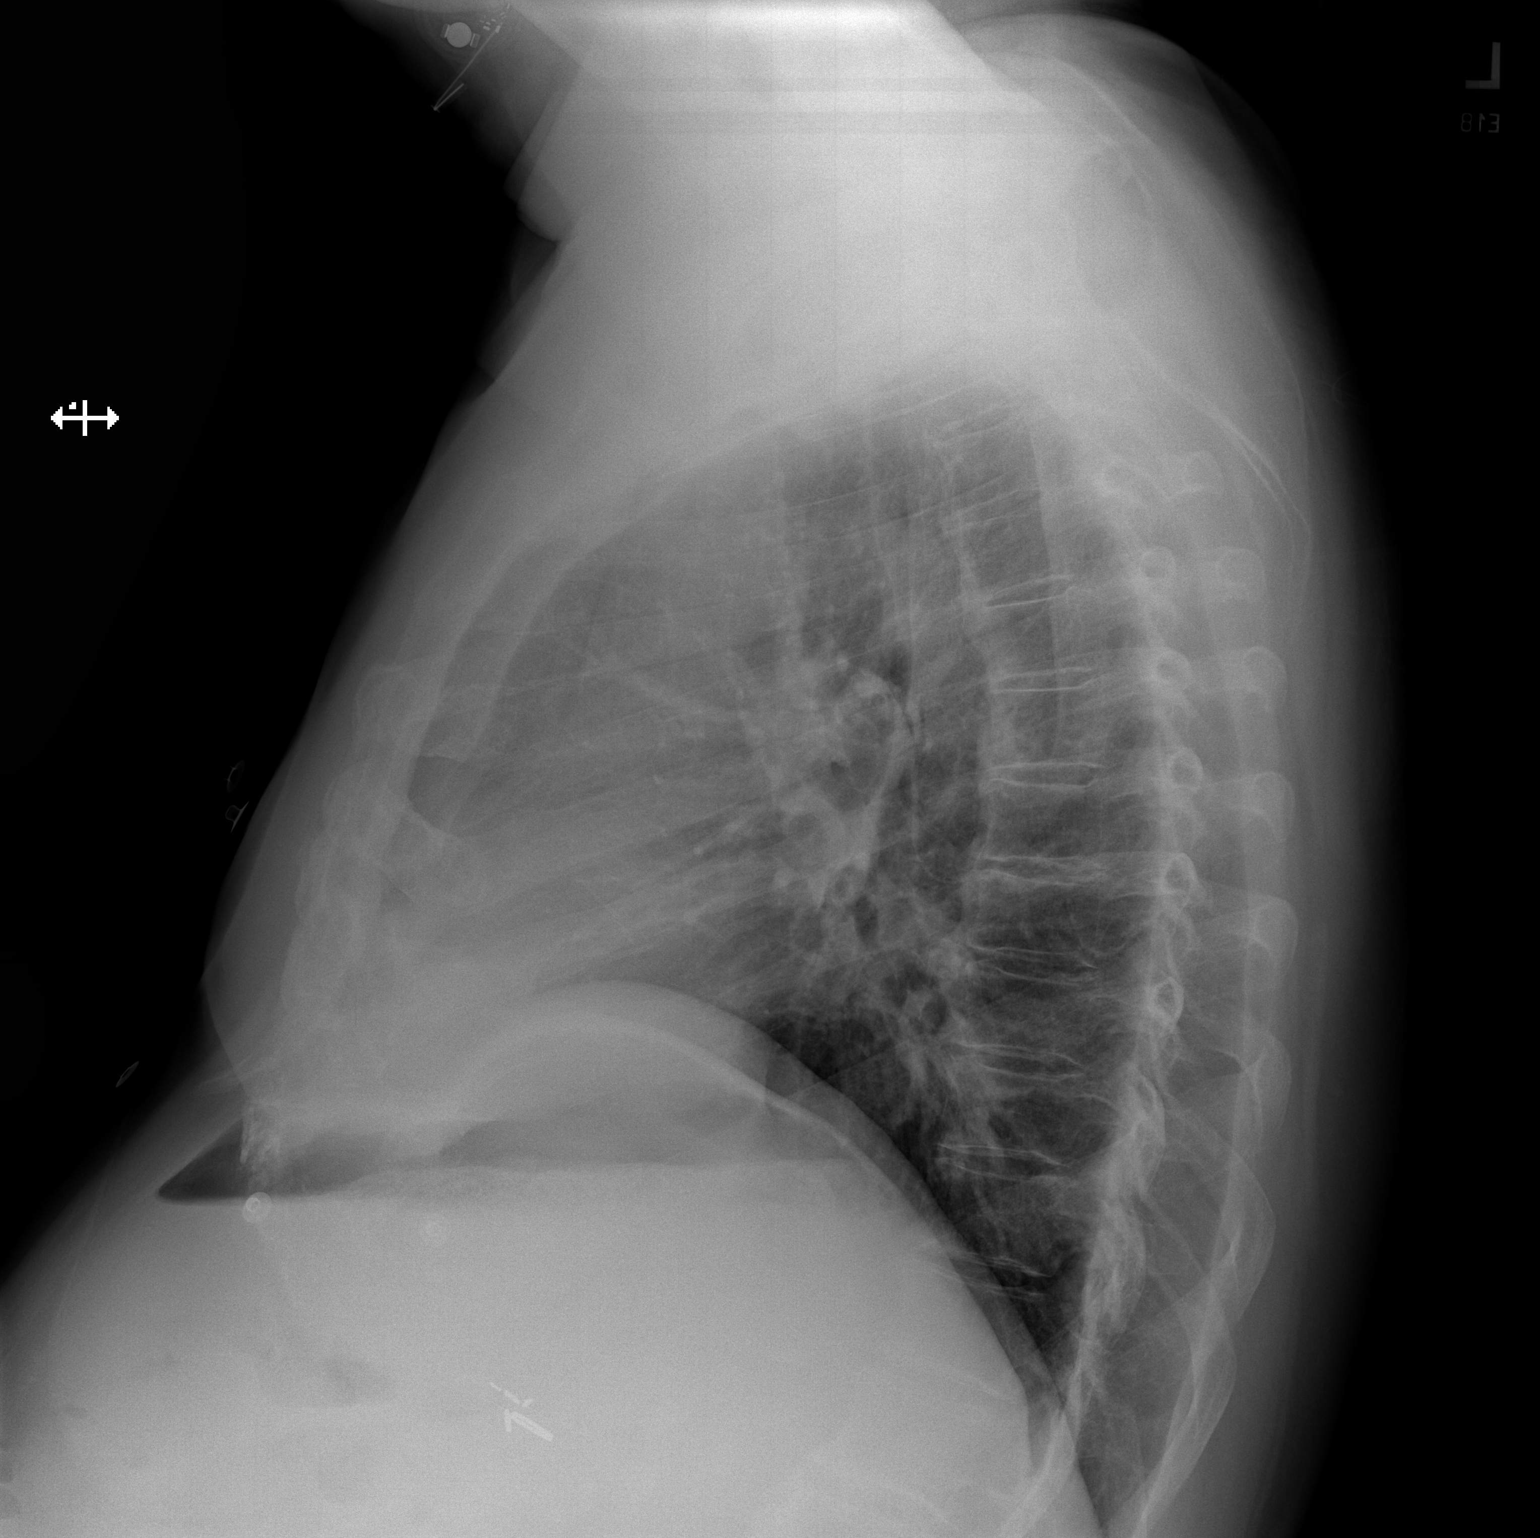

[2 of 2 positions shown; findings below may reference images not displayed]

FINDINGS: The lungs are clear. There is no pleural effusion pneumothorax. The
cardiac silhouette is within limits. No acute osseous pathology.
Degenerative changes of the spine. Right upper quadrant
cholecystectomy clips.
IMPRESSION: No active cardiopulmonary disease.

## 2021-03-19 ENCOUNTER — Encounter: Payer: Self-pay | Admitting: Internal Medicine

## 2021-03-19 ENCOUNTER — Encounter: Payer: Self-pay | Admitting: Family Medicine

## 2021-03-19 ENCOUNTER — Telehealth (INDEPENDENT_AMBULATORY_CARE_PROVIDER_SITE_OTHER): Payer: Medicare HMO | Admitting: Family Medicine

## 2021-03-19 ENCOUNTER — Other Ambulatory Visit: Payer: Self-pay

## 2021-03-19 DIAGNOSIS — J069 Acute upper respiratory infection, unspecified: Secondary | ICD-10-CM | POA: Diagnosis not present

## 2021-03-19 MED ORDER — BENZONATATE 200 MG PO CAPS: 200.0000 mg | ORAL_CAPSULE | Freq: Three times a day (TID) | ORAL | 1 refills | Status: DC | PRN

## 2021-03-19 MED ORDER — FREESTYLE LIBRE 2 SENSOR MISC: 1.0000 | 5 refills | Status: DC

## 2021-03-19 NOTE — Progress Notes (Signed)
Virtual visit completed through WebEx or similar program Patient location: home  Provider location: [place] at [facility], office  Participants: Patient and me (unless stated otherwise below)  Pandemic considerations d/w pt.   Limitations and rationale for visit method d/w patient.  Patient agreed to proceed.   CC: URI  HPI:  Cough, sore throat.  No fever but B ear pain since Wednesday. Patient has been taking tylenol due to also having a head ache. Patient took covid test this am and was negative.  Slightly hoarse since yesterday.  Coughing and B ear pain kept patient from sleeping.   At home with wife, she is feeling well.  No sputum.  No vomiting, no diarrhea.  No rash.  Can still take a deep breath.  No diffuse aches.  Mild maxillary pressure.  No frontal pain. Tylenol helps with pain.  He tried valsalva w/o change in his ears, ie ETD.    Sugar has been 75-125.  He needed refill on DM2 sensors, rx sent at [facility].  D/w pt.    Meds and allergies reviewed.   ROS: Per HPI unless specifically indicated in ROS section   NAD Speech wnl  A/P: URI.   Likely viral.  ETD. Restart flonase.  Discussed with patient about gently performing Valsalva.  Can use Tylenol as needed.   Discussed cough.  Reasonable to use Tessalon.  Rest and fluids.  Update [place] as needed.   He agrees with plan.

## 2021-03-21 DIAGNOSIS — J069 Acute upper respiratory infection, unspecified: Secondary | ICD-10-CM | POA: Insufficient documentation

## 2021-03-21 NOTE — Assessment & Plan Note (Signed)
URI.   Likely viral.  ETD. Restart flonase.  Discussed with patient about gently performing Valsalva.  Can use Tylenol as needed.   Discussed cough.  Reasonable to use Tessalon.  Rest and fluids.  Update [place] as needed.   He agrees with plan.

## 2021-03-22 ENCOUNTER — Encounter: Payer: Self-pay | Admitting: Family Medicine

## 2021-03-23 ENCOUNTER — Other Ambulatory Visit: Payer: Self-pay | Admitting: Family Medicine

## 2021-03-23 MED ORDER — DOXYCYCLINE HYCLATE 100 MG PO TABS: 100.0000 mg | ORAL_TABLET | Freq: Two times a day (BID) | ORAL | 0 refills | Status: DC

## 2021-03-26 ENCOUNTER — Encounter: Payer: Self-pay | Admitting: Internal Medicine

## 2021-03-26 MED ORDER — FREESTYLE LIBRE 2 SENSOR MISC: 1.0000 | 5 refills | Status: DC

## 2021-03-30 ENCOUNTER — Encounter: Payer: Self-pay | Admitting: Internal Medicine

## 2021-04-04 ENCOUNTER — Other Ambulatory Visit: Payer: Self-pay | Admitting: Internal Medicine

## 2021-04-05 DIAGNOSIS — E119 Type 2 diabetes mellitus without complications: Secondary | ICD-10-CM | POA: Diagnosis not present

## 2021-04-10 ENCOUNTER — Other Ambulatory Visit: Payer: Self-pay | Admitting: Family Medicine

## 2021-04-15 ENCOUNTER — Encounter: Payer: Self-pay | Admitting: Internal Medicine

## 2021-04-22 ENCOUNTER — Encounter: Payer: Self-pay | Admitting: Internal Medicine

## 2021-04-22 ENCOUNTER — Encounter: Payer: Self-pay | Admitting: Family Medicine

## 2021-04-28 ENCOUNTER — Encounter: Payer: Self-pay | Admitting: Internal Medicine

## 2021-04-28 ENCOUNTER — Ambulatory Visit (INDEPENDENT_AMBULATORY_CARE_PROVIDER_SITE_OTHER): Payer: Medicare HMO | Admitting: Internal Medicine

## 2021-04-28 ENCOUNTER — Other Ambulatory Visit: Payer: Self-pay

## 2021-04-28 VITALS — BP 110/72 | HR 73 | Temp 97.9°F | Ht 69.0 in | Wt 229.0 lb

## 2021-04-28 DIAGNOSIS — N643 Galactorrhea not associated with childbirth: Secondary | ICD-10-CM | POA: Diagnosis not present

## 2021-04-28 LAB — PROLACTIN: Prolactin: 5 ng/mL (ref 2.0–18.0)

## 2021-04-28 NOTE — Assessment & Plan Note (Addendum)
Goes back 6 months or so Has some thickening in upper lateral breast--but not near the nipple (and it doesn't feel bad) Will check prolactin level--if normal, will set up for evaluation with surgeon Bary Castilla)  If prolactin high---will check pituitary MRI Consider bromcriptine Rx

## 2021-04-28 NOTE — Progress Notes (Signed)
Subjective:    Patient ID: James Camacho, male    DOB: April 08, 1950, 71 y.o.   MRN: [id number]  HPI Here due to nipple discharge on the left Mostly feels some moisture if out in the cold This goes back 6 months No soreness No masses  Current Outpatient Medications on File Prior to Visit  Medication Sig Dispense Refill   amLODipine (NORVASC) 10 MG tablet TAKE 1 TABLET BY MOUTH EVERY DAY 90 tablet 3   ASPIRIN 81 PO Take by mouth.     atorvastatin (LIPITOR) 10 MG tablet TAKE 1 TABLET BY MOUTH EVERY DAY 90 tablet 3   benzonatate (TESSALON) 200 MG capsule Take 1 capsule (200 mg total) by mouth 3 (three) times daily as needed for cough. 30 capsule 1   carvedilol (COREG) 12.5 MG tablet TAKE 1 TABLET (12.5 MG TOTAL) BY MOUTH 2 (TWO) TIMES DAILY WITH A MEAL. 180 tablet 3   citalopram (CELEXA) 20 MG tablet Take 1 tablet (20 mg total) by mouth daily. 90 tablet 3   Continuous Blood Gluc Sensor (FREESTYLE LIBRE 2 SENSOR) MISC 1 each by Does not apply route every 14 (fourteen) days. 2 each 5   doxycycline (VIBRA-TABS) 100 MG tablet Take 1 tablet (100 mg total) by mouth 2 (two) times daily. 14 tablet 0   FARXIGA 5 MG TABS tablet TAKE 1 TABLET BY MOUTH EVERY DAY 30 tablet 5   fluticasone (FLONASE) 50 MCG/ACT nasal spray SPRAY 2 SPRAYS INTO EACH NOSTRIL EVERY DAY 48 mL 3   furosemide (LASIX) 40 MG tablet TAKE 1 TABLET BY MOUTH EVERY DAY 90 tablet 3   Insulin Pen Needle (PEN NEEDLES) 32G X 6 MM MISC 1 each by Does not apply route daily. Use with Victoza once a day 50 each 11   lisinopril (ZESTRIL) 40 MG tablet TAKE 1 TABLET BY MOUTH EVERY DAY 90 tablet 3   omeprazole (PRILOSEC) 20 MG capsule TAKE 1 CAPSULE BY MOUTH EVERY DAY 90 capsule 3   ondansetron (ZOFRAN ODT) 4 MG disintegrating tablet Take 1 tablet (4 mg total) by mouth every 8 (eight) hours as needed for nausea or vomiting. 12 tablet 0   Semaglutide, 1 MG/DOSE, (OZEMPIC, 1 MG/DOSE,) 4 MG/3ML SOPN Inject 1 mg into the skin once a week.      tadalafil (CIALIS) 20 MG tablet Take 0.5-1 tablets (10-20 mg total) by mouth every other day as needed for erectile dysfunction. 10 tablet 11   No current facility-administered medications on file prior to visit.    Allergies  Allergen Reactions   Aleve [Naproxen Sodium] Hives   Other     Pt can only take Tylenol for pain- anything prescribed that he has tried breaks him out in a rash.    Penicillins Rash    Past Medical History:  Diagnosis Date   Anal condylomata    Anxiety    Depression    ED (erectile dysfunction)    History of adenomatous polyp of colon    History of exercise stress test    ETT 04-17-2007 (in epic)  negative   Hyperlipidemia    LDL in 120's   Hypertension    followed by pcp   OSA (obstructive sleep apnea)    OSA on CPAP    study in epic 08-11-2017 moderate osa   Seasonal allergic rhinitis    Type 2 diabetes mellitus ([place])    followed by pcp   Wears glasses    Wears hearing aid in both ears  Past Surgical History:  Procedure Laterality Date   CATARACT EXTRACTION W/PHACO Right 06/28/2017   Procedure: CATARACT EXTRACTION PHACO AND INTRAOCULAR LENS PLACEMENT ([place]) RIGHT;  Surgeon: Leandrew Koyanagi, MD;  Location: [facility];  Service: Ophthalmology;  Laterality: Right;   CATARACT EXTRACTION W/PHACO Left 07/19/2017   Procedure: CATARACT EXTRACTION PHACO AND INTRAOCULAR LENS PLACEMENT ([place]) LEFT TOPICAL;  Surgeon: Leandrew Koyanagi, MD;  Location: [facility];  Service: Ophthalmology;  Laterality: Left;  IVA TOPICALLEFTDIABETIC   CHOLECYSTECTOMY OPEN  1980s   dr  Sharlet Salina   AND APPENDECTOMY   COLONOSCOPY WITH PROPOFOL  last one 03-28-2018   dr Hilarie Fredrickson   LASER ABLATION CONDOLAMATA N/A 12/20/2018   Procedure: LASER ABLATION OF CONDOLAMATA, EXAMINATION UNDER ANESTHESIA;  Surgeon: Michael Boston, MD;  Location: [facility];  Service: General;  Laterality: N/A;    Family History  Problem Relation Age of Onset    Diabetes Mother    Arthritis Mother    Hypertension Mother    Chronic Renal Failure Mother    Parkinsonism Father    Heart disease Father    Healthy Sister    Healthy Brother    Healthy Sister    Cancer Neg Hx        no colon or prostate cancer   Colon cancer Neg Hx    Esophageal cancer Neg Hx    Stomach cancer Neg Hx    Rectal cancer Neg Hx     Social History   Socioeconomic History   Marital status: Married    Spouse name: Not on file   Number of children: 3   Years of education: Not on file   Highest education level: Not on file  Occupational History   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, site business    Comment:    Tobacco Use   Smoking status: Never    Passive exposure: Never   Smokeless tobacco: Never  Vaping Use   Vaping Use: Never used  Substance and Sexual Activity   Alcohol use: Not Currently    Comment: rare   Drug use: Never   Sexual activity: Not on file  Other Topics Concern   Not on file  Social History Narrative   Has living will   Wife is health care POA--- children are alternates   Would accept resuscitation   No prolonged tube feeds   Social Determinants of Health   Financial Resource Strain: Not on file  Food Insecurity: Not on file  Transportation Needs: Not on file  Physical Activity: Not on file  Stress: Not on file  Social Connections: Not on file  Intimate Partner Violence: Not on file   Review of Systems No trauma No fevers Did double citalopram---doesn't seem to have helped as yet (2 weeks ago)     Objective:   Physical Exam Constitutional:      Appearance: Normal appearance.  Genitourinary:    Comments: No mass in right breast Small thickened area around 1 o'clock in distal left breast Small yellowish discharge easily elicited with massage from left nipple No areola abnormalities Neurological:     Mental Status: He is alert.            Assessment & Plan:

## 2021-04-29 ENCOUNTER — Encounter: Payer: Self-pay | Admitting: Internal Medicine

## 2021-04-29 DIAGNOSIS — N643 Galactorrhea not associated with childbirth: Secondary | ICD-10-CM

## 2021-04-30 ENCOUNTER — Encounter: Payer: Self-pay | Admitting: Internal Medicine

## 2021-05-13 DIAGNOSIS — G4733 Obstructive sleep apnea (adult) (pediatric): Secondary | ICD-10-CM | POA: Diagnosis not present

## 2021-05-18 ENCOUNTER — Other Ambulatory Visit: Payer: Self-pay | Admitting: General Surgery

## 2021-05-18 DIAGNOSIS — N6452 Nipple discharge: Secondary | ICD-10-CM | POA: Diagnosis not present

## 2021-05-30 ENCOUNTER — Emergency Department
Admission: EM | Admit: 2021-05-30 | Discharge: 2021-05-30 | Disposition: A | Discharge status: Home / Self Care | Payer: Medicare HMO | Attending: Emergency Medicine | Admitting: Emergency Medicine

## 2021-05-30 ENCOUNTER — Emergency Department: Payer: Medicare HMO

## 2021-05-30 ENCOUNTER — Other Ambulatory Visit: Payer: Self-pay

## 2021-05-30 ENCOUNTER — Encounter: Payer: Self-pay | Admitting: Internal Medicine

## 2021-05-30 DIAGNOSIS — Z743 Need for continuous supervision: Secondary | ICD-10-CM | POA: Diagnosis not present

## 2021-05-30 DIAGNOSIS — E119 Type 2 diabetes mellitus without complications: Secondary | ICD-10-CM | POA: Diagnosis not present

## 2021-05-30 DIAGNOSIS — I10 Essential (primary) hypertension: Secondary | ICD-10-CM | POA: Diagnosis not present

## 2021-05-30 DIAGNOSIS — R42 Dizziness and giddiness: Secondary | ICD-10-CM | POA: Diagnosis not present

## 2021-05-30 DIAGNOSIS — R61 Generalized hyperhidrosis: Secondary | ICD-10-CM | POA: Insufficient documentation

## 2021-05-30 DIAGNOSIS — E860 Dehydration: Secondary | ICD-10-CM | POA: Diagnosis not present

## 2021-05-30 DIAGNOSIS — R531 Weakness: Secondary | ICD-10-CM | POA: Diagnosis not present

## 2021-05-30 LAB — CBC
HCT: 47.9 % (ref 39.0–52.0)
Hemoglobin: 15.3 g/dL (ref 13.0–17.0)
MCH: 29.9 pg (ref 26.0–34.0)
MCHC: 31.9 g/dL (ref 30.0–36.0)
MCV: 93.6 fL (ref 80.0–100.0)
Platelets: 230 10*3/uL (ref 150–400)
RBC: 5.12 MIL/uL (ref 4.22–5.81)
RDW: 13.3 % (ref 11.5–15.5)
WBC: 10.7 10*3/uL — ABNORMAL HIGH (ref 4.0–10.5)
nRBC: 0 % (ref 0.0–0.2)

## 2021-05-30 LAB — BASIC METABOLIC PANEL
Anion gap: 12 (ref 5–15)
BUN: 11 mg/dL (ref 8–23)
CO2: 24 mmol/L (ref 22–32)
Calcium: 8.7 mg/dL — ABNORMAL LOW (ref 8.9–10.3)
Chloride: 102 mmol/L (ref 98–111)
Creatinine, Ser: 1.06 mg/dL (ref 0.61–1.24)
GFR, Estimated: >60 mL/min (ref >60)
Glucose, Bld: 158 mg/dL — ABNORMAL HIGH (ref 70–99)
Potassium: 3.3 mmol/L — ABNORMAL LOW (ref 3.5–5.1)
Sodium: 138 mmol/L (ref 135–145)

## 2021-05-30 LAB — TROPONIN I (HIGH SENSITIVITY): Troponin I (High Sensitivity): 5 ng/L (ref <18)

## 2021-05-30 NOTE — ED Provider Notes (Signed)
? ?[facility] ?Provider Note ? ? Event Date/Time  ? First MD Initiated Contact with Patient 05/30/21 1952   ?  (approximate) ?History  ?Dizziness ? ?HPI ?James Camacho is a 71 y.o. male with a stated past medical history of diabetes and hypertension who presents for a 45-minute episode of diaphoresis, lightheadedness, generalized weakness that occurred approximately 1 hour prior to arrival in the setting of finding out that someone had passed away and becoming upset.  Patient also states that he has had a recent GI irritation with diarrhea for the last 3 days.  Patient had negative orthostatics with EMS however he was somewhat hypotensive with initial pressure of 96/54.  Patient denies any chest pain, shortness of breath, vomiting ?Physical Exam  ?Triage Vital Signs: ?ED Triage Vitals  ?Enc Vitals Group  ?   BP 05/30/21 1942 102/67  ?   Pulse Rate 05/30/21 1942 79  ?   Resp 05/30/21 1942 18  ?   Temp 05/30/21 1942 98.7 ?F (37.1 ?C)  ?   Temp Source 05/30/21 1942 Oral  ?   SpO2 05/30/21 1942 96 %  ?   Weight 05/30/21 1941 221 lb (100.2 kg)  ?   Height 05/30/21 1941 '5\' 9"'$  (1.753 m)  ?   Head Circumference --   ?   Peak Flow --   ?   Pain Score 05/30/21 1940 0  ?   Pain Loc --   ?   Pain Edu? --   ?   Excl. in [place]? --   ? ?Most recent vital signs: ?Vitals:  ? 05/30/21 2000 05/30/21 2030  ?BP: 118/76 109/77  ?Pulse: 81 76  ?Resp: 17 15  ?Temp:    ?SpO2: 100% 96%  ? ?General: Awake, oriented x4. ?CV:  Good peripheral perfusion.  ?Resp:  Normal effort.  ?Abd:  No distention.  ?Other:  Middle-aged overweight Caucasian male laying in bed in no distress ?ED Results / Procedures / Treatments  ?Labs ?(all labs ordered are listed, but only abnormal results are displayed) ?Labs Reviewed  ?BASIC METABOLIC PANEL - Abnormal; Notable for the following components:  ?    Result Value  ? Potassium 3.3 (*)   ? Glucose, Bld 158 (*)   ? Calcium 8.7 (*)   ? All other components within normal limits  ?CBC -  Abnormal; Notable for the following components:  ? WBC 10.7 (*)   ? All other components within normal limits  ?TROPONIN I (HIGH SENSITIVITY)  ? ?EKG ?ED ECG REPORT ?I, Naaman Plummer, the attending physician, personally viewed and interpreted this ECG. ?Date: 05/30/2021 ?EKG Time: 1941 ?Rate: 80 ?Rhythm: normal sinus rhythm ?QRS Axis: normal ?Intervals: normal ?ST/T Wave abnormalities: normal ?Narrative Interpretation: no evidence of acute ischemia ?RADIOLOGY ?ED MD interpretation: 2 view chest x-ray interpreted by me shows no evidence of acute abnormalities including no pneumonia, pneumothorax, or widened mediastinum ?-Agree with radiology assessment ?Official radiology report(s): ?DG Chest 2 View ? ?Result Date: 05/30/2021 ?CLINICAL DATA:  Dizziness and lightheaded. EXAM: CHEST - 2 VIEW COMPARISON:  March 31, 2020 FINDINGS: The heart size and mediastinal contours are within normal limits. Both lungs are clear. Radiopaque surgical clips are seen within the right upper quadrant. The visualized skeletal structures are unremarkable. IMPRESSION: No active cardiopulmonary disease. Electronically Signed   By: Virgina Norfolk M.D.   On: 05/30/2021 20:31   ?PROCEDURES: ?Critical Care performed: No ?.1-3 Lead EKG Interpretation ?Performed by: Naaman Plummer, MD ?Authorized by: Valora Piccolo  K, MD  ? ?  Interpretation: normal   ?  ECG rate:  77 ?  ECG rate assessment: normal   ?  Rhythm: sinus rhythm   ?  Ectopy: none   ?  Conduction: normal   ?MEDICATIONS ORDERED IN ED: ?Medications - No data to display ?IMPRESSION / MDM / ASSESSMENT AND PLAN / ED COURSE  ?I reviewed the triage vital signs and the nursing notes. ?             ?               ?The patient is on the cardiac monitor to evaluate for evidence of arrhythmia and/or significant heart rate changes. ?Patient presents with complaints of syncope/presyncope ?ED Workup:  ?CBC, BMP, Troponin, BNP, ECG, CXR ?Differential diagnosis includes HF, ICH, seizure, stroke,  HOCM, ACS, aortic dissection, malignant arrhythmia, or GI bleed. ?Findings: ?No evidence of acute laboratory abnormalities.  Troponin negative x1 ?EKG: No e/o STEMI. No evidence of Brugada?s sign, delta wave, epsilon wave, significantly prolonged QTc, or malignant arrhythmia. ? ?Disposition: Discharge. Patient is at baseline at this time. Return precautions expressed and understood in person. Advised follow up with primary care provider or clinic physician in next 24 hours. ? ?  ?FINAL CLINICAL IMPRESSION(S) / ED DIAGNOSES  ? ?Final diagnoses:  ?Dehydration  ?Lightheadedness  ? ?Rx / DC Orders  ? ?ED Discharge Orders   ? ? None  ? ?  ? ?Note:  This document was prepared using Dragon voice recognition software and may include unintentional dictation errors. ?  ?Naaman Plummer, MD ?05/30/21 2110 ? ?

## 2021-05-30 NOTE — ED Notes (Signed)
Patient transported to X-ray 

## 2021-05-30 NOTE — ED Triage Notes (Signed)
Pt states about an hour and a half ago he began to feel dizzy, lightheaded, felt faint and broke out in a sweat. Pt states felt weak, denies chest pain.  ?

## 2021-05-30 NOTE — ED Triage Notes (Signed)
FIRST NURSE NOTE:  Pt arrived via [facility] reports dizziness and diaphoretic, pt was visiting at [facility] reports he found out someone had passed away and became upset, recent stomach bug diarrhea x 3 days, neg orthostatics with EMS 96/54 initial pressure. ? ?114/70 on arrival to ED,  97.9 CBG-135 ? 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 ?

## 2021-05-30 NOTE — ED Notes (Signed)
Pt d/c home per MD order,. Discharge summary reviewed , pt verbalizes understanding. Off unit via WC- No s/s of acute distress noted at discharge. Pt d/c home with family.  ?

## 2021-05-31 MED ORDER — PREDNISONE 20 MG PO TABS: 40.0000 mg | ORAL_TABLET | Freq: Every day | ORAL | 0 refills | Status: DC

## 2021-06-04 ENCOUNTER — Ambulatory Visit
Admission: RE | Admit: 2021-06-04 | Discharge: 2021-06-04 | Disposition: A | Discharge status: Home / Self Care | Payer: Medicare HMO | Source: Ambulatory Visit | Attending: General Surgery | Admitting: General Surgery

## 2021-06-04 DIAGNOSIS — N6002 Solitary cyst of left breast: Secondary | ICD-10-CM | POA: Diagnosis not present

## 2021-06-04 DIAGNOSIS — N6452 Nipple discharge: Secondary | ICD-10-CM | POA: Insufficient documentation

## 2021-06-07 ENCOUNTER — Other Ambulatory Visit: Payer: Self-pay | Admitting: General Surgery

## 2021-06-07 DIAGNOSIS — N6002 Solitary cyst of left breast: Secondary | ICD-10-CM

## 2021-06-07 DIAGNOSIS — R928 Other abnormal and inconclusive findings on diagnostic imaging of breast: Secondary | ICD-10-CM

## 2021-06-10 ENCOUNTER — Other Ambulatory Visit: Payer: Self-pay | Admitting: General Surgery

## 2021-06-10 DIAGNOSIS — N6325 Unspecified lump in the left breast, overlapping quadrants: Secondary | ICD-10-CM | POA: Diagnosis not present

## 2021-06-10 DIAGNOSIS — N63 Unspecified lump in unspecified breast: Secondary | ICD-10-CM | POA: Diagnosis not present

## 2021-06-11 LAB — SURGICAL PATHOLOGY

## 2021-07-05 ENCOUNTER — Encounter: Payer: Self-pay | Admitting: Internal Medicine

## 2021-07-05 NOTE — Telephone Encounter (Signed)
Spoke to pt. He ate it at lunch today. I suggested he go to the gift shop and see if they have Zyrtec or if Benadryl does not make him drowsy, get Benadryl. Advised I will send message to Dr Silvio Pate. ?

## 2021-07-06 ENCOUNTER — Encounter: Payer: Self-pay | Admitting: Internal Medicine

## 2021-07-06 MED ORDER — PREDNISONE 20 MG PO TABS: 40.0000 mg | ORAL_TABLET | Freq: Every day | ORAL | 0 refills | Status: DC

## 2021-07-09 ENCOUNTER — Ambulatory Visit (INDEPENDENT_AMBULATORY_CARE_PROVIDER_SITE_OTHER): Payer: Medicare HMO | Admitting: Internal Medicine

## 2021-07-09 ENCOUNTER — Encounter: Payer: Self-pay | Admitting: Internal Medicine

## 2021-07-09 VITALS — BP 100/60 | HR 74 | Temp 97.6°F | Ht 68.0 in | Wt 224.0 lb

## 2021-07-09 DIAGNOSIS — Z0000 Encounter for general adult medical examination without abnormal findings: Secondary | ICD-10-CM | POA: Diagnosis not present

## 2021-07-09 DIAGNOSIS — I10 Essential (primary) hypertension: Secondary | ICD-10-CM

## 2021-07-09 DIAGNOSIS — E785 Hyperlipidemia, unspecified: Secondary | ICD-10-CM

## 2021-07-09 DIAGNOSIS — E1159 Type 2 diabetes mellitus with other circulatory complications: Secondary | ICD-10-CM

## 2021-07-09 DIAGNOSIS — F39 Unspecified mood [affective] disorder: Secondary | ICD-10-CM | POA: Diagnosis not present

## 2021-07-09 DIAGNOSIS — R69 Illness, unspecified: Secondary | ICD-10-CM | POA: Diagnosis not present

## 2021-07-09 LAB — RENAL FUNCTION PANEL
Albumin: 4.1 g/dL (ref 3.5–5.2)
BUN: 6 mg/dL (ref 6–23)
CO2: 33 mEq/L — ABNORMAL HIGH (ref 19–32)
Calcium: 8.7 mg/dL (ref 8.4–10.5)
Chloride: 102 mEq/L (ref 96–112)
Creatinine, Ser: 0.78 mg/dL (ref 0.40–1.50)
GFR: 91.22 mL/min (ref >60.00)
Glucose, Bld: 92 mg/dL (ref 70–99)
Phosphorus: 2.9 mg/dL (ref 2.3–4.6)
Potassium: 3.5 mEq/L (ref 3.5–5.1)
Sodium: 141 mEq/L (ref 135–145)

## 2021-07-09 LAB — LIPID PANEL
Cholesterol: 115 mg/dL (ref 0–200)
HDL: 40.3 mg/dL (ref >39.00)
LDL Cholesterol: 62 mg/dL (ref 0–99)
NonHDL: 74.92
Total CHOL/HDL Ratio: 3
Triglycerides: 65 mg/dL (ref 0.0–149.0)
VLDL: 13 mg/dL (ref 0.0–40.0)

## 2021-07-09 LAB — CBC
HCT: 46.2 % (ref 39.0–52.0)
Hemoglobin: 15.2 g/dL (ref 13.0–17.0)
MCHC: 32.9 g/dL (ref 30.0–36.0)
MCV: 93.2 fl (ref 78.0–100.0)
Platelets: 191 10*3/uL (ref 150.0–400.0)
RBC: 4.95 Mil/uL (ref 4.22–5.81)
RDW: 13.9 % (ref 11.5–15.5)
WBC: 5.5 10*3/uL (ref 4.0–10.5)

## 2021-07-09 LAB — HEPATIC FUNCTION PANEL
ALT: 27 U/L (ref 0–53)
AST: 21 U/L (ref 0–37)
Albumin: 4.1 g/dL (ref 3.5–5.2)
Alkaline Phosphatase: 67 U/L (ref 39–117)
Bilirubin, Direct: 0.2 mg/dL (ref 0.0–0.3)
Total Bilirubin: 1 mg/dL (ref 0.2–1.2)
Total Protein: 7.1 g/dL (ref 6.0–8.3)

## 2021-07-09 LAB — MICROALBUMIN / CREATININE URINE RATIO
Creatinine,U: 175.1 mg/dL
Microalb Creat Ratio: 1 mg/g (ref 0.0–30.0)
Microalb, Ur: 1.7 mg/dL (ref 0.0–1.9)

## 2021-07-09 LAB — HEMOGLOBIN A1C: Hgb A1c MFr Bld: 5.9 % (ref 4.6–6.5)

## 2021-07-09 LAB — HM DIABETES FOOT EXAM

## 2021-07-09 MED ORDER — TADALAFIL 20 MG PO TABS: 10.0000 mg | ORAL_TABLET | ORAL | 11 refills | Status: DC | PRN

## 2021-07-09 NOTE — Assessment & Plan Note (Signed)
I have personally reviewed the Medicare Annual Wellness questionnaire and have noted ?1. The patient's medical and social history ?2. Their use of alcohol, tobacco or illicit drugs ?3. Their current medications and supplements ?4. The patient's functional ability including ADL's, fall risks, home safety risks and hearing or visual ?            impairment. ?5. Diet and physical activities ?6. Evidence for depression or mood disorders ? ?The patients weight, height, BMI and visual acuity have been recorded in the chart ?I have made referrals, counseling and provided education to the patient based review of the above and I have provided the pt with a written personalized care plan for preventive services. ? ?I have provided you with a copy of your personalized plan for preventive services. Please take the time to review along with your updated medication list. ? ?Colon due 2025 ?Will defer last PSA to next year ?Discussed increased exercise and adding resistance work ?Td/shingrix at pharmacy ?COVID and flu vaccines by the fall ?

## 2021-07-09 NOTE — Assessment & Plan Note (Signed)
BP Readings from Last 3 Encounters:  ?07/09/21 100/60  ?05/30/21 118/72  ?04/28/21 110/72  ? ?Doing well on the amoldipine 10, lisinopril 40 and carvedilol 12.5 bi ?

## 2021-07-09 NOTE — Assessment & Plan Note (Signed)
Lab Results  ?Component Value Date  ? HGBA1C 5.9 (A) 01/08/2021  ? ?Hopefully still good control on farxiga '5mg'$ ,and ozempic '1mg'$  weekly ?Will check labs ?Eye exam soon ?

## 2021-07-09 NOTE — Progress Notes (Signed)
Vision Screening  ? Right eye Left eye Both eyes  ?Without correction     ?With correction '20/40 20/50 20/30 '$  ?Hearing Screening - Comments:: Has hearing aids. Wearing them today ? ?

## 2021-07-09 NOTE — Assessment & Plan Note (Signed)
Continues on atorvastatin '10mg'$  ?

## 2021-07-09 NOTE — Progress Notes (Signed)
? ?Subjective:  ? ? Patient ID: James Camacho, male    DOB: Jan 07, 1951, 71 y.o.   MRN: [id number] ? ?HPI ?Here for Medicare wellness visit and follow up of chronic health conditions ?Reviewed advanced directives ?Reviewed other doctors---Dr Dub Mikes, Dr Barbee Cough, Dr Kemp--dentist, Dr Maryellen Pile ?No surgery or hospitalizations in the past year ?Rare alcohol  ?No  tobacco ?Vision is okay---due for exam ?Hearing aides do help ?No falls ?Is walking regularly ---20-25 minutes three times a week ?Mood is better now--continues on the citalopram. Has stress with wife---due to dementia and mood lability ?Independent with instrumental ADLs ?No sig memory issues ? ?Allergic reaction is better with just a couple of doses of the antibiotic ?Ate the wrong thing in hospital cafeteria---son at [facility] getting V-P shunt ? ?Still has some breast leakage from left ?Will follow up with [facility] ? ?Not using freestyle libre--due to cost ?Checking sugars weekly---150 or below ?No hypoglycemic reactions ?No foot pain, burning or numbness ? ?No chest pain ?No SOB ?No palpitations ?No dizziness or syncope ?No headaches ?No edema with the furosemide--daily ? ?Rare heartburn on daily omeprazole ?No dysphagia ? ?Current Outpatient Medications on File Prior to Visit  ?Medication Sig Dispense Refill  ? amLODipine (NORVASC) 10 MG tablet TAKE 1 TABLET BY MOUTH EVERY DAY 90 tablet 3  ? ASPIRIN 81 PO Take by mouth.    ? atorvastatin (LIPITOR) 10 MG tablet TAKE 1 TABLET BY MOUTH EVERY DAY 90 tablet 3  ? carvedilol (COREG) 12.5 MG tablet TAKE 1 TABLET (12.5 MG TOTAL) BY MOUTH 2 (TWO) TIMES DAILY WITH A MEAL. 180 tablet 3  ? citalopram (CELEXA) 20 MG tablet Take 1 tablet (20 mg total) by mouth daily. 90 tablet 3  ? Continuous Blood Gluc Sensor (FREESTYLE LIBRE 2 SENSOR) MISC 1 each by Does not apply route every 14 (fourteen) days. 2 each 5  ? FARXIGA 5 MG TABS tablet TAKE 1 TABLET BY MOUTH EVERY DAY 30 tablet 5  ? fluticasone (FLONASE)  50 MCG/ACT nasal spray SPRAY 2 SPRAYS INTO EACH NOSTRIL EVERY DAY 48 mL 3  ? furosemide (LASIX) 40 MG tablet TAKE 1 TABLET BY MOUTH EVERY DAY 90 tablet 3  ? Insulin Pen Needle (PEN NEEDLES) 32G X 6 MM MISC 1 each by Does not apply route daily. Use with Victoza once a day 50 each 11  ? lisinopril (ZESTRIL) 40 MG tablet TAKE 1 TABLET BY MOUTH EVERY DAY 90 tablet 3  ? omeprazole (PRILOSEC) 20 MG capsule TAKE 1 CAPSULE BY MOUTH EVERY DAY 90 capsule 3  ? ondansetron (ZOFRAN ODT) 4 MG disintegrating tablet Take 1 tablet (4 mg total) by mouth every 8 (eight) hours as needed for nausea or vomiting. 12 tablet 0  ? predniSONE (DELTASONE) 20 MG tablet Take 2 tablets (40 mg total) by mouth daily. For 3 days, then 1 tab daily for 3 days. 9 tablet 0  ? Semaglutide, 1 MG/DOSE, (OZEMPIC, 1 MG/DOSE,) 4 MG/3ML SOPN Inject 1 mg into the skin once a week.    ? tadalafil (CIALIS) 20 MG tablet Take 0.5-1 tablets (10-20 mg total) by mouth every other day as needed for erectile dysfunction. 10 tablet 11  ? ?No current facility-administered medications on file prior to visit.  ? ? ?Allergies  ?Allergen Reactions  ? Aleve [Naproxen Sodium] Hives  ? Shrimp (Diagnostic) Other (See Comments)  ?  Itching and dry mouth  ? Other   ?  Pt can only take Tylenol for pain- anything prescribed that he  has tried breaks him out in a rash.   ? Penicillin G Rash  ? Penicillins Rash  ? ? ?Past Medical History:  ?Diagnosis Date  ? Anal condylomata   ? Anxiety   ? Depression   ? ED (erectile dysfunction)   ? History of adenomatous polyp of colon   ? History of exercise stress test   ? ETT 04-17-2007 (in epic)  negative  ? Hyperlipidemia   ? LDL in 120's  ? Hypertension   ? followed by pcp  ? OSA (obstructive sleep apnea)   ? OSA on CPAP   ? study in epic 08-11-2017 moderate osa  ? Seasonal allergic rhinitis   ? Type 2 diabetes mellitus ([place])   ? followed by pcp  ? Wears glasses   ? Wears hearing aid in both ears   ? ? ?Past Surgical History:  ?Procedure  Laterality Date  ? CATARACT EXTRACTION W/PHACO Right 06/28/2017  ? Procedure: CATARACT EXTRACTION PHACO AND INTRAOCULAR LENS PLACEMENT ([place]) RIGHT;  Surgeon: Leandrew Koyanagi, MD;  Location: [place];  Service: Ophthalmology;  Laterality: Right;  ? CATARACT EXTRACTION W/PHACO Left 07/19/2017  ? Procedure: CATARACT EXTRACTION PHACO AND INTRAOCULAR LENS PLACEMENT ([place]) LEFT TOPICAL;  Surgeon: Leandrew Koyanagi, MD;  Location: [place];  Service: Ophthalmology;  Laterality: Left;  IVA TOPICALLEFTDIABETIC  ? CHOLECYSTECTOMY OPEN  1980s   dr  Sharlet Salina  ? AND APPENDECTOMY  ? COLONOSCOPY WITH PROPOFOL  last one 03-28-2018   dr pyrtle  ? LASER ABLATION CONDOLAMATA N/A 12/20/2018  ? Procedure: LASER ABLATION OF CONDOLAMATA, EXAMINATION UNDER ANESTHESIA;  Surgeon: Michael Boston, MD;  Location: [place];  Service: General;  Laterality: N/A;  ? ? ?Family History  ?Problem Relation Age of Onset  ? Diabetes Mother   ? Arthritis Mother   ? Hypertension Mother   ? Chronic Renal Failure Mother   ? Parkinsonism Father   ? Heart disease Father   ? Healthy Sister   ? Healthy Sister   ? Healthy Brother   ? Cancer Neg Hx   ?     no colon or prostate cancer  ? Colon cancer Neg Hx   ? Esophageal cancer Neg Hx   ? Stomach cancer Neg Hx   ? Rectal cancer Neg Hx   ? Breast cancer Neg Hx   ? ? ?Social History  ? ?Socioeconomic History  ? Marital status: Married  ?  Spouse name: Not on file  ? Number of children: 3  ? Years of education: Not on file  ? Highest education level: Not on file  ?Occupational History  ? 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  Comment: Sold and retired  ? Occupation: Engineer, site business  ?  Comment:    ?Tobacco Use  ? Smoking status: Never  ?  Passive exposure: Never  ? Smokeless tobacco: Never  ?Vaping Use  ? Vaping Use: Never used  ?Substance and Sexual Activity  ? Alcohol use: Not Currently  ?  Comment: rare  ? Drug use: Never  ?  Sexual activity: Not on file  ?Other Topics Concern  ? Not on file  ?Social History Narrative  ? Has living will  ? Wife is health care POA--- children are alternates  ? Would accept resuscitation  ? No prolonged tube feeds  ? ?Social Determinants of Health  ? ?Financial Resource Strain: Not on file  ?Food Insecurity: Not on file  ?Transportation Needs: Not on file  ?  Physical Activity: Not on file  ?Stress: Not on file  ?Social Connections: Not on file  ?Intimate Partner Violence: Not on file  ? ?Review of Systems ?Appetite is good ?Weight just slightly back up again ?Wears seat belt ?Sleeps well ?Teeth okay--sees dentist ?No suspicious skin lesions---cream for elbow psoriasis ?Voids okay---stream is fine. Some urgency with furosemide. ?Satisfied with cialis ?Will have sporadic back pain/sciatica. Mostly at night--and better with adjusting position ?   ?Objective:  ? Physical Exam ?Constitutional:   ?   Appearance: Normal appearance.  ?HENT:  ?   Mouth/Throat:  ?   Comments: No lesions ?Eyes:  ?   Conjunctiva/sclera: Conjunctivae normal.  ?   Pupils: Pupils are equal, round, and reactive to light.  ?Cardiovascular:  ?   Rate and Rhythm: Normal rate and regular rhythm.  ?   Pulses: Normal pulses.  ?   Heart sounds: No murmur heard. ?  No gallop.  ?Abdominal:  ?   Palpations: Abdomen is soft.  ?   Tenderness: There is no abdominal tenderness.  ?Musculoskeletal:  ?   Cervical back: Neck supple.  ?   Right lower leg: No edema.  ?   Left lower leg: No edema.  ?Lymphadenopathy:  ?   Cervical: No cervical adenopathy.  ?Skin: ?   Findings: No lesion or rash.  ?   Comments: No foot lesions  ?Neurological:  ?   General: No focal deficit present.  ?   Mental Status: He is alert and oriented to person, place, and time.  ?   Comments: Normal sensation in feet  ?Psychiatric:     ?   Mood and Affect: Mood normal.     ?   Behavior: Behavior normal.  ?  ? ? ? ? ?   ?Assessment & Plan:  ? ?

## 2021-07-09 NOTE — Assessment & Plan Note (Signed)
Mostly better from his legal problems but now stress with wife ?Doing well on citalopram '20mg'$  daily ?

## 2021-07-21 ENCOUNTER — Other Ambulatory Visit: Payer: Self-pay | Admitting: Internal Medicine

## 2021-08-05 DIAGNOSIS — N6452 Nipple discharge: Secondary | ICD-10-CM | POA: Diagnosis not present

## 2021-08-31 DIAGNOSIS — E119 Type 2 diabetes mellitus without complications: Secondary | ICD-10-CM | POA: Diagnosis not present

## 2021-08-31 LAB — HM DIABETES EYE EXAM

## 2021-09-20 ENCOUNTER — Other Ambulatory Visit: Payer: Self-pay | Admitting: Internal Medicine

## 2021-09-20 DIAGNOSIS — J0110 Acute frontal sinusitis, unspecified: Secondary | ICD-10-CM

## 2021-09-29 DIAGNOSIS — E119 Type 2 diabetes mellitus without complications: Secondary | ICD-10-CM | POA: Diagnosis not present

## 2021-09-29 DIAGNOSIS — H43812 Vitreous degeneration, left eye: Secondary | ICD-10-CM | POA: Diagnosis not present

## 2021-09-29 DIAGNOSIS — H18513 Endothelial corneal dystrophy, bilateral: Secondary | ICD-10-CM | POA: Diagnosis not present

## 2021-10-11 ENCOUNTER — Encounter: Payer: Self-pay | Admitting: Internal Medicine

## 2021-10-12 ENCOUNTER — Encounter: Payer: Self-pay | Admitting: Internal Medicine

## 2021-10-12 DIAGNOSIS — H43813 Vitreous degeneration, bilateral: Secondary | ICD-10-CM | POA: Diagnosis not present

## 2021-11-01 ENCOUNTER — Other Ambulatory Visit: Payer: Self-pay | Admitting: Internal Medicine

## 2021-11-02 NOTE — Telephone Encounter (Signed)
No phone number was provided so I am not able to return this person's call. I have not received anything from anyone for this pt.

## 2021-11-02 NOTE — Telephone Encounter (Signed)
Benita from [employer] called in stating that she sent over a fax a few days ago,and hasn't received a response back yet. She would like to know what's going on?

## 2021-11-12 DIAGNOSIS — M3501 Sicca syndrome with keratoconjunctivitis: Secondary | ICD-10-CM | POA: Diagnosis not present

## 2021-11-16 ENCOUNTER — Encounter: Payer: Self-pay | Admitting: Internal Medicine

## 2021-11-16 ENCOUNTER — Other Ambulatory Visit: Payer: Self-pay | Admitting: Internal Medicine

## 2021-11-16 MED ORDER — DAPAGLIFLOZIN PROPANEDIOL 5 MG PO TABS: 5.0000 mg | ORAL_TABLET | Freq: Every day | ORAL | 3 refills | Status: DC

## 2021-11-16 MED ORDER — LISINOPRIL 40 MG PO TABS: 40.0000 mg | ORAL_TABLET | Freq: Every day | ORAL | 3 refills | Status: DC

## 2021-11-16 NOTE — Addendum Note (Signed)
Addended by: Pilar Grammes on: 11/16/2021 04:06 PM   Modules accepted: Orders

## 2021-11-17 ENCOUNTER — Encounter: Payer: Self-pay | Admitting: Internal Medicine

## 2021-11-19 MED ORDER — EMPAGLIFLOZIN 10 MG PO TABS: 10.0000 mg | ORAL_TABLET | Freq: Every day | ORAL | 3 refills | Status: DC

## 2021-11-23 ENCOUNTER — Other Ambulatory Visit: Payer: Self-pay | Admitting: General Surgery

## 2021-11-23 DIAGNOSIS — N6452 Nipple discharge: Secondary | ICD-10-CM | POA: Diagnosis not present

## 2021-11-23 NOTE — Progress Notes (Signed)
Progress Notes - documented in this encounter , James Boot, MD - 11/23/2021 11:00 AM EDT Formatting of this note is different from the original. Subjective:   Patient ID: James Camacho is a 71 y.o. male.  HPI  The following portions of the patient's history were reviewed and updated as appropriate.  This an established patient is here today for: office visit. The patient is here today for evaluation of continued nipple discharge, post left breast biopsy 06-10-21. He reports drainage is slightly improved. This is usually with manipulation. No pain. Drainage is likely to occur with vigorous activity or direct manipulation of the breast. No bloody drainage.  Since his last visit his 47 year old son passed after developing bacterial meningitis post shunt placement post aneurysm repair  2 daughters are helpful with the his wife who has dementia. 1 is a nurse at the ICU at [facility], Jonnie Kind, RN  Chief Complaint  Patient presents with  Follow-up    BP 131/77  Pulse 78  Temp 36.8 C (98.3 F)  Ht 175.3 cm ('5\' 9"'$ )  Wt (!) 105.7 kg (233 lb)  SpO2 97%  BMI 34.41 kg/m   Past Medical History:  Diagnosis Date  Anal condylomata  Anxiety  Depression  ED (erectile dysfunction)  History of adenomatous polyp of colon  Hyperlipidemia  Hypertension  OSA on CPAP  Seasonal allergic rhinitis  Type 2 diabetes mellitus (CMS-H[facility])    Past Surgical History:  Procedure Laterality Date  CATARACT EXTRACTION 06/28/2017  CATARACT EXTRACTION Left 07/19/2017  COLONOSCOPY 03/28/2018  Dr. Hilarie Fredrickson  laser ablation condyloma 12/20/2018  ultrasound guided breast biopsy Left 06/10/2021  BENIGN MAMMARY PARENCHYMA WITH STROMAL FIBROSIS, GYNECOMASTOID  CHOLECYSTECTOMY  1980's; open, Dr. Sharlet Salina    Social History   Socioeconomic History  Marital status: Married  Tobacco Use  Smoking status: Never  Smokeless tobacco: Never    Allergies  Allergen Reactions  Aleve [Naproxen  Sodium] Hives  Shrimp Other (See Comments)  Itching and dry mouth  Penicillin Rash  Penicillins Rash   Current Outpatient Medications  Medication Sig Dispense Refill  amLODIPine (NORVASC) 10 MG tablet Take 1 tablet by mouth once daily  aspirin 81 MG chewable tablet Take by mouth once daily  atorvastatin (LIPITOR) 10 MG tablet Take 1 tablet by mouth once daily  benzonatate (TESSALON) 200 MG capsule Take 200 mg by mouth 3 (three) times daily as needed  carvediloL (COREG) 12.5 MG tablet Take by mouth  citalopram (CELEXA) 20 MG tablet Take 20 mg by mouth once daily  dapagliflozin (FARXIGA) 5 mg Tab tablet Take 1 tablet by mouth once daily  fluticasone propionate (FLONASE) 50 mcg/actuation nasal spray Place 2 sprays into both nostrils once daily  FUROsemide (LASIX) 40 MG tablet Take 1 tablet by mouth once daily  lisinopriL (ZESTRIL) 40 MG tablet Take 1 tablet by mouth once daily  omeprazole (PRILOSEC) 20 MG DR capsule Take 1 capsule by mouth once daily  OZEMPIC 1 mg/dose (4 mg/3 mL) pen injector INJECT '1MG'$  INTO THE SKIN ONCE A WEEK  tadalafiL (CIALIS) 20 MG tablet Take by mouth   No current facility-administered medications for this visit.   Family History  Problem Relation Age of Onset  High blood pressure (Hypertension) Mother  Diabetes Mother  Arthritis Mother  Kidney failure Mother  Parkinsonism Father  Heart disease Father  Colon cancer Neg Hx   Labs and Radiology:   June 10, 2021 vacuum biopsy:  BREAST, LEFT; EXCISIONAL BIOPSY:  - BENIGN MAMMARY PARENCHYMA WITH STROMAL FIBROSIS AND  DUCTAL  PROLIFERATION/DILATATION, COMPATIBLE WITH GYNECOMASTOID HYPERPLASIA.  - NEGATIVE FOR ATYPICAL PROLIFERATIVE BREAST DISEASE.  November 23, 2021 ultrasound:  Ultrasound examination of the retroareolar tissue on the right was undertaken. Multiple dilated ducts with up to 1.2 x 1.3 x 1.4 cm. No intraductal lesions.  August 05, 2021 left breast ultrasound:  Ultrasound examination shows  area of multiloculated cyst measuring 0.9 x 1 x 1.2 cm. Some debris within the area is noted. No associated inflammation or induration  Jul 09, 2021 laboratory:  WBC 4.0 - 10.5 K/uL 5.5  RBC 4.22 - 5.81 Mil/uL 4.95  Platelets 150.0 - 400.0 K/uL 191.0  Hemoglobin 13.0 - 17.0 g/dL 15.2  HCT 39.0 - 52.0 % 46.2  MCV 78.0 - 100.0 fl 93.2  MCHC 30.0 - 36.0 g/dL 32.9  RDW 11.5 - 15.5 % 13.9  Sodium 135 - 145 mEq/L 141  Potassium 3.5 - 5.1 mEq/L 3.5  Chloride 96 - 112 mEq/L 102  CO2 19 - 32 mEq/L 33 High  Albumin 3.5 - 5.2 g/dL 4.1  BUN 6 - 23 mg/dL 6  Creatinine, Ser 0.40 - 1.50 mg/dL 0.78  Glucose, Bld 70 - 99 mg/dL 92  Phosphorus 2.3 - 4.6 mg/dL 2.9  GFR >60.00 mL/min 91.22   Review of Systems  Constitutional: Negative for chills and fever.  Respiratory: Negative for cough.    Objective:  Physical Exam Constitutional:  Appearance: Normal appearance.  Cardiovascular:  Rate and Rhythm: Normal rate and regular rhythm.  Pulses: Normal pulses.  Heart sounds: Normal heart sounds.  Pulmonary:  Effort: Pulmonary effort is normal.  Breath sounds: Normal breath sounds.  Chest:  Breasts: Left: Nipple discharge (with direct pressure, clear) present.  Musculoskeletal:  Cervical back: Neck supple.  Lymphadenopathy:  Upper Body:  Right upper body: No supraclavicular or axillary adenopathy.  Left upper body: No supraclavicular or axillary adenopathy.  Neurological:  Mental Status: He is alert and oriented to person, place, and time.  Psychiatric:  Mood and Affect: Mood normal.  Behavior: Behavior normal.    Assessment:   Left nipple drainage, persistent, enlarged retroareolar spaces on ultrasound.  Plan:   It is reasonable to proceed to direct excision of these areas to minimize recurrence. The patient will have scheduling set up to minimally interfere with his planned corneal transplant.   This note is partially prepared by Ledell Noss, CMA acting as a scribe in the  presence of Dr. Hervey Ard, MD.   The documentation recorded by the scribe accurately reflects the service I personally performed and the decisions made by me.   Robert Bellow, MD FACS  Electronically signed by Mayer Masker, MD at 11/23/2021 1:48 PM EDT

## 2021-11-24 ENCOUNTER — Other Ambulatory Visit: Payer: Self-pay

## 2021-11-24 ENCOUNTER — Ambulatory Visit
Admission: RE | Admit: 2021-11-24 | Discharge: 2021-11-24 | Disposition: A | Discharge status: Home / Self Care | Payer: HMO | Source: Ambulatory Visit | Attending: General Surgery | Admitting: General Surgery

## 2021-11-24 DIAGNOSIS — I10 Essential (primary) hypertension: Secondary | ICD-10-CM | POA: Insufficient documentation

## 2021-11-24 DIAGNOSIS — E1159 Type 2 diabetes mellitus with other circulatory complications: Secondary | ICD-10-CM

## 2021-11-24 NOTE — Patient Instructions (Signed)
Your procedure is scheduled on: 11/26/2021 Report to Day Surgery. To find out your arrival time please call [phone] between 1PM - 3PM on 11/25/2021.  Remember: Instructions that are not followed completely may result in serious medical risk,  up to and including death, or upon the discretion of your surgeon and anesthesiologist your  surgery may need to be rescheduled.     _X__ 1. Do not eat food after midnight the night before your procedure.                 No chewing gum or hard candies. You may drink clear liquids up to 2 hours                 before you are scheduled to arrive for your surgery- DO not drink clear                 liquids within 2 hours of the start of your surgery.                 Clear Liquids include:  water, apple juice without pulp, clear Gatorade, G2 or                  Gatorade Zero (avoid Red/Purple/Blue), Black Coffee or Tea (Do not add                 anything to coffee or tea). _____2.   Complete the "Ensure Clear Pre-surgery Clear Carbohydrate Drink" provided to you, 2 hours before arrival. **If you are diabetic you will be       provided with an alternative drink, Gatorade Zero or G2.  __X__2.  On the morning of surgery brush your teeth with toothpaste and water, you                may rinse your mouth with mouthwash if you wish.  Do not swallow any toothpaste or mouthwash.     _X__ 3.  No Alcohol for 24 hours before or after surgery.   _X__ 4.  Do Not Smoke or use e-cigarettes For 24 Hours Prior to Your Surgery.                 Do not use any chewable tobacco products for at least 6 hours prior to                 Surgery.  _X__  5.  Do not use any recreational drugs (marijuana, cocaine, heroin, ecstasy, MDMA or other) For at least one week prior to your surgery.  Combination of these drugs with anesthesia may have life threatening results.  ____  6.  Bring all medications with you on the day of surgery if instructed.   ____   7.  Notify your doctor if there is any change in your medical condition      (cold, fever, infections).     Do not wear jewelry, make-up, hairpins, clips or nail polish. Do not wear lotions, powders, or perfumes. You may wear deodorant. Do not shave 48 hours prior to surgery. Men may shave face and neck. Do not bring valuables to the hospital.    [facility] is not responsible for any belongings or valuables.  Contacts, dentures or bridgework may not be worn into surgery. Leave your suitcase in the car. After surgery it may be brought to your room. For patients admitted to the hospital, discharge time is determined by your treatment team.   Patients discharged the  day of surgery will not be allowed to drive home.   Make arrangements for someone to be with you for the first 24 hours of your Same Day Discharge.    Please read over the following fact sheets that you were given:       ____ Take these medicines the morning of surgery with A SIP OF WATER:    1. Carvedilol  2. Omeprazole  3. citalopram  4. Lisinopril  5.  6.  ____ Fleet Enema (as directed)   ____ Use CHG Soap (or wipes) as directed  ____ Use Benzoyl Peroxide Gel as instructed  ____ Use inhalers on the day of surgery  ____ Stop metformin 2 days prior to surgery    ____ Take 1/2 of usual insulin dose the night before surgery. No insulin the morning          of surgery.   __X__ Stop aspirin on today  __X__ Stop Anti-inflammatories on today.   ____ Stop supplements until after surgery.    __X_ Bring C-Pap to the hospital.    If you have any questions regarding your pre-procedure instructions,  Please call Pre-admit Testing at [phone]

## 2021-11-26 ENCOUNTER — Other Ambulatory Visit: Payer: Self-pay

## 2021-11-26 ENCOUNTER — Ambulatory Visit: Payer: HMO | Admitting: Urgent Care

## 2021-11-26 ENCOUNTER — Ambulatory Visit
Admission: RE | Admit: 2021-11-26 | Discharge: 2021-11-26 | Disposition: A | Discharge status: Home / Self Care | Payer: HMO | Attending: General Surgery | Admitting: General Surgery

## 2021-11-26 ENCOUNTER — Encounter: Payer: Self-pay | Admitting: General Surgery

## 2021-11-26 ENCOUNTER — Encounter
Admission: RE | Disposition: A | Discharge status: Home / Self Care | Payer: Self-pay | Source: Home / Self Care | Attending: General Surgery

## 2021-11-26 ENCOUNTER — Ambulatory Visit: Payer: HMO

## 2021-11-26 ENCOUNTER — Ambulatory Visit: Payer: HMO | Admitting: Certified Registered"

## 2021-11-26 DIAGNOSIS — D242 Benign neoplasm of left breast: Secondary | ICD-10-CM | POA: Insufficient documentation

## 2021-11-26 DIAGNOSIS — F32A Depression, unspecified: Secondary | ICD-10-CM | POA: Diagnosis not present

## 2021-11-26 DIAGNOSIS — E119 Type 2 diabetes mellitus without complications: Secondary | ICD-10-CM | POA: Diagnosis not present

## 2021-11-26 DIAGNOSIS — E785 Hyperlipidemia, unspecified: Secondary | ICD-10-CM | POA: Diagnosis not present

## 2021-11-26 DIAGNOSIS — F419 Anxiety disorder, unspecified: Secondary | ICD-10-CM | POA: Diagnosis not present

## 2021-11-26 DIAGNOSIS — N63 Unspecified lump in unspecified breast: Secondary | ICD-10-CM | POA: Diagnosis not present

## 2021-11-26 DIAGNOSIS — N6452 Nipple discharge: Secondary | ICD-10-CM | POA: Diagnosis not present

## 2021-11-26 DIAGNOSIS — G4733 Obstructive sleep apnea (adult) (pediatric): Secondary | ICD-10-CM | POA: Diagnosis not present

## 2021-11-26 DIAGNOSIS — N62 Hypertrophy of breast: Secondary | ICD-10-CM | POA: Diagnosis not present

## 2021-11-26 DIAGNOSIS — K219 Gastro-esophageal reflux disease without esophagitis: Secondary | ICD-10-CM | POA: Diagnosis not present

## 2021-11-26 DIAGNOSIS — R928 Other abnormal and inconclusive findings on diagnostic imaging of breast: Secondary | ICD-10-CM | POA: Diagnosis not present

## 2021-11-26 DIAGNOSIS — E1159 Type 2 diabetes mellitus with other circulatory complications: Secondary | ICD-10-CM

## 2021-11-26 DIAGNOSIS — I10 Essential (primary) hypertension: Secondary | ICD-10-CM | POA: Diagnosis not present

## 2021-11-26 HISTORY — PX: BREAST DUCTAL SYSTEM EXCISION: SHX5242

## 2021-11-26 LAB — POCT I-STAT, CHEM 8
BUN: 7 mg/dL — ABNORMAL LOW (ref 8–23)
Calcium, Ion: 1.12 mmol/L — ABNORMAL LOW (ref 1.15–1.40)
Chloride: 99 mmol/L (ref 98–111)
Creatinine, Ser: 0.8 mg/dL (ref 0.61–1.24)
Glucose, Bld: 122 mg/dL — ABNORMAL HIGH (ref 70–99)
HCT: 42 % (ref 39.0–52.0)
Hemoglobin: 14.3 g/dL (ref 13.0–17.0)
Potassium: 3.5 mmol/L (ref 3.5–5.1)
Sodium: 140 mmol/L (ref 135–145)
TCO2: 24 mmol/L (ref 22–32)

## 2021-11-26 LAB — GLUCOSE, CAPILLARY: Glucose-Capillary: 157 mg/dL — ABNORMAL HIGH (ref 70–99)

## 2021-11-26 SURGERY — EXCISION DUCTAL SYSTEM BREAST
Anesthesia: General | Laterality: Left

## 2021-11-26 MED ORDER — CEFAZOLIN SODIUM-DEXTROSE 2-4 GM/100ML-% IV SOLN
2.0000 g | INTRAVENOUS | Status: AC
  Administered 2021-11-26: 2 g via INTRAVENOUS

## 2021-11-26 MED ORDER — PROPOFOL 10 MG/ML IV BOLUS
INTRAVENOUS | Status: AC
  Filled 2021-11-26: qty 20

## 2021-11-26 MED ORDER — FENTANYL CITRATE (PF) 100 MCG/2ML IJ SOLN
INTRAMUSCULAR | Status: AC
  Filled 2021-11-26: qty 2

## 2021-11-26 MED ORDER — ALBUTEROL SULFATE HFA 108 (90 BASE) MCG/ACT IN AERS
INHALATION_SPRAY | RESPIRATORY_TRACT | Status: DC | PRN
  Administered 2021-11-26: 2 via RESPIRATORY_TRACT

## 2021-11-26 MED ORDER — CHLORHEXIDINE GLUCONATE CLOTH 2 % EX PADS
6.0000 | MEDICATED_PAD | Freq: Once | CUTANEOUS | Status: AC
  Administered 2021-11-26: 6 via TOPICAL

## 2021-11-26 MED ORDER — ONDANSETRON HCL 4 MG/2ML IJ SOLN: 4.0000 mg | Freq: Once | INTRAMUSCULAR | Status: DC | PRN

## 2021-11-26 MED ORDER — EPHEDRINE SULFATE (PRESSORS) 50 MG/ML IJ SOLN
INTRAMUSCULAR | Status: DC | PRN
  Administered 2021-11-26: 10 mg via INTRAVENOUS

## 2021-11-26 MED ORDER — DEXAMETHASONE SODIUM PHOSPHATE 10 MG/ML IJ SOLN
INTRAMUSCULAR | Status: AC
  Filled 2021-11-26: qty 1

## 2021-11-26 MED ORDER — FENTANYL CITRATE (PF) 100 MCG/2ML IJ SOLN
INTRAMUSCULAR | Status: DC | PRN
  Administered 2021-11-26: 100 ug via INTRAVENOUS

## 2021-11-26 MED ORDER — LACTATED RINGERS IV SOLN: INTRAVENOUS | Status: DC | PRN

## 2021-11-26 MED ORDER — FENTANYL CITRATE (PF) 100 MCG/2ML IJ SOLN: 25.0000 ug | INTRAMUSCULAR | Status: DC | PRN

## 2021-11-26 MED ORDER — CHLORHEXIDINE GLUCONATE 0.12 % MT SOLN
15.0000 mL | Freq: Once | OROMUCOSAL | Status: AC
  Administered 2021-11-26: 15 mL via OROMUCOSAL

## 2021-11-26 MED ORDER — ACETAMINOPHEN 10 MG/ML IV SOLN
INTRAVENOUS | Status: DC | PRN
  Administered 2021-11-26: 1000 mg via INTRAVENOUS

## 2021-11-26 MED ORDER — HYDROCODONE-ACETAMINOPHEN 5-325 MG PO TABS: 1.0000 | ORAL_TABLET | ORAL | 0 refills | Status: DC | PRN

## 2021-11-26 MED ORDER — ALBUTEROL SULFATE HFA 108 (90 BASE) MCG/ACT IN AERS
INHALATION_SPRAY | RESPIRATORY_TRACT | Status: AC
  Filled 2021-11-26: qty 6.7

## 2021-11-26 MED ORDER — CEFAZOLIN SODIUM-DEXTROSE 2-4 GM/100ML-% IV SOLN
INTRAVENOUS | Status: AC
  Filled 2021-11-26: qty 100

## 2021-11-26 MED ORDER — PROPOFOL 10 MG/ML IV BOLUS
INTRAVENOUS | Status: DC | PRN
  Administered 2021-11-26: 40 mg via INTRAVENOUS
  Administered 2021-11-26: 160 mg via INTRAVENOUS

## 2021-11-26 MED ORDER — LIDOCAINE HCL (CARDIAC) PF 100 MG/5ML IV SOSY
PREFILLED_SYRINGE | INTRAVENOUS | Status: DC | PRN
  Administered 2021-11-26: 100 mg via INTRAVENOUS

## 2021-11-26 MED ORDER — SUCCINYLCHOLINE CHLORIDE 200 MG/10ML IV SOSY
PREFILLED_SYRINGE | INTRAVENOUS | Status: DC | PRN
  Administered 2021-11-26: 120 mg via INTRAVENOUS

## 2021-11-26 MED ORDER — LIDOCAINE HCL (PF) 2 % IJ SOLN
INTRAMUSCULAR | Status: AC
  Filled 2021-11-26: qty 5

## 2021-11-26 MED ORDER — ACETAMINOPHEN 10 MG/ML IV SOLN
INTRAVENOUS | Status: AC
  Filled 2021-11-26: qty 100

## 2021-11-26 MED ORDER — SODIUM CHLORIDE 0.9 % IV SOLN: INTRAVENOUS | Status: DC

## 2021-11-26 MED ORDER — DEXAMETHASONE SODIUM PHOSPHATE 10 MG/ML IJ SOLN
INTRAMUSCULAR | Status: DC | PRN
  Administered 2021-11-26: 10 mg via INTRAVENOUS

## 2021-11-26 MED ORDER — BUPIVACAINE-EPINEPHRINE (PF) 0.5% -1:200000 IJ SOLN
INTRAMUSCULAR | Status: DC | PRN
  Administered 2021-11-26: 30 mL

## 2021-11-26 MED ORDER — BUPIVACAINE-EPINEPHRINE (PF) 0.5% -1:200000 IJ SOLN
INTRAMUSCULAR | Status: AC
  Filled 2021-11-26: qty 30

## 2021-11-26 MED ORDER — ONDANSETRON HCL 4 MG/2ML IJ SOLN
INTRAMUSCULAR | Status: AC
  Filled 2021-11-26: qty 2

## 2021-11-26 MED ORDER — CHLORHEXIDINE GLUCONATE CLOTH 2 % EX PADS: 6.0000 | MEDICATED_PAD | Freq: Once | CUTANEOUS | Status: DC

## 2021-11-26 MED ORDER — CHLORHEXIDINE GLUCONATE 0.12 % MT SOLN
OROMUCOSAL | Status: AC
  Filled 2021-11-26: qty 15

## 2021-11-26 MED ORDER — ORAL CARE MOUTH RINSE: 15.0000 mL | Freq: Once | OROMUCOSAL | Status: AC

## 2021-11-26 SURGICAL SUPPLY — 45 items
APL PRP STRL LF DISP 70% ISPRP (MISCELLANEOUS) ×1
BLADE SURG 15 STRL SS SAFETY (BLADE) ×2 IMPLANT
CHLORAPREP W/TINT 26 (MISCELLANEOUS) ×1 IMPLANT
CNTNR SPEC 2.5X3XGRAD LEK (MISCELLANEOUS)
CONT SPEC 4OZ STER OR WHT (MISCELLANEOUS)
CONT SPEC 4OZ STRL OR WHT (MISCELLANEOUS)
CONTAINER SPEC 2.5X3XGRAD LEK (MISCELLANEOUS) IMPLANT
COVER PROBE FLX POLY STRL (MISCELLANEOUS) ×1 IMPLANT
DEVICE DUBIN SPECIMEN MAMMOGRA (MISCELLANEOUS) ×1 IMPLANT
DRAPE LAPAROTOMY 100X77 ABD (DRAPES) ×1 IMPLANT
DRSG GAUZE FLUFF 36X18 (GAUZE/BANDAGES/DRESSINGS) ×1 IMPLANT
DRSG TELFA 3X4 N-ADH STERILE (GAUZE/BANDAGES/DRESSINGS) ×1 IMPLANT
ELECT CAUTERY BLADE TIP 2.5 (TIP) ×1
ELECT REM PT RETURN 9FT ADLT (ELECTROSURGICAL) ×1
ELECTRODE CAUTERY BLDE TIP 2.5 (TIP) ×1 IMPLANT
ELECTRODE REM PT RTRN 9FT ADLT (ELECTROSURGICAL) ×1 IMPLANT
GAUZE 4X4 16PLY ~~LOC~~+RFID DBL (SPONGE) ×1 IMPLANT
GLOVE BIO SURGEON STRL SZ7.5 (GLOVE) ×1 IMPLANT
GLOVE SURG UNDER LTX SZ8 (GLOVE) ×1 IMPLANT
GOWN STRL REUS W/ TWL LRG LVL3 (GOWN DISPOSABLE) ×2 IMPLANT
GOWN STRL REUS W/TWL LRG LVL3 (GOWN DISPOSABLE) ×4
KIT TURNOVER KIT A (KITS) ×1 IMPLANT
LABEL OR SOLS (LABEL) ×1 IMPLANT
MANIFOLD NEPTUNE II (INSTRUMENTS) ×1 IMPLANT
MARGIN MAP 10MM (MISCELLANEOUS) ×1 IMPLANT
NDL HYPO 25X1 1.5 SAFETY (NEEDLE) ×1 IMPLANT
NEEDLE HYPO 22GX1.5 SAFETY (NEEDLE) ×1 IMPLANT
NEEDLE HYPO 25X1 1.5 SAFETY (NEEDLE) ×1 IMPLANT
PACK BASIN MINOR ARMC (MISCELLANEOUS) ×1 IMPLANT
RETRACTOR RING XSMALL (MISCELLANEOUS) IMPLANT
RTRCTR WOUND ALEXIS 13CM XS SH (MISCELLANEOUS)
SHEARS HARMONIC 9CM CVD (BLADE) IMPLANT
STRIP CLOSURE SKIN 1/2X4 (GAUZE/BANDAGES/DRESSINGS) ×1 IMPLANT
SUT ETHILON 3-0 FS-10 30 BLK (SUTURE) ×1
SUT VIC AB 2-0 CT1 27 (SUTURE) ×2
SUT VIC AB 2-0 CT1 TAPERPNT 27 (SUTURE) ×2 IMPLANT
SUT VIC AB 4-0 FS2 27 (SUTURE) ×1 IMPLANT
SUTURE EHLN 3-0 FS-10 30 BLK (SUTURE) ×1 IMPLANT
SWABSTK COMLB BENZOIN TINCTURE (MISCELLANEOUS) ×1 IMPLANT
SYR 10ML LL (SYRINGE) ×1 IMPLANT
TAPE TRANSPORE STRL 2 31045 (GAUZE/BANDAGES/DRESSINGS) IMPLANT
TRAP FLUID SMOKE EVACUATOR (MISCELLANEOUS) ×1 IMPLANT
TRAP NEPTUNE SPECIMEN COLLECT (MISCELLANEOUS) ×1 IMPLANT
WATER STERILE IRR 1000ML POUR (IV SOLUTION) ×1 IMPLANT
WATER STERILE IRR 500ML POUR (IV SOLUTION) ×1 IMPLANT

## 2021-11-26 NOTE — Discharge Instructions (Signed)

## 2021-11-26 NOTE — Anesthesia Postprocedure Evaluation (Signed)
Anesthesia Post Note  Patient: James Camacho  Procedure(s) Performed: EXCISION DUCTAL SYSTEM BREAST (Left)  Patient location during evaluation: PACU Anesthesia Type: General Level of consciousness: awake and oriented Pain management: pain level controlled Vital Signs Assessment: post-procedure vital signs reviewed and stable Respiratory status: spontaneous breathing and respiratory function stable Cardiovascular status: stable Anesthetic complications: no   No notable events documented.   Last Vitals:  Vitals:   11/26/21 1125  BP: 138/75  Pulse: 66  Resp: 12  Temp: 36.6 C  SpO2: 97%    Last Pain:  Vitals:   11/26/21 1125  TempSrc: Temporal  PainSc: 0-No pain                 VAN STAVEREN,

## 2021-11-26 NOTE — Anesthesia Procedure Notes (Signed)
Procedure Name: Intubation Date/Time: 11/26/2021 12:14 PM  Performed by: Natasha Mead, CRNAPre-anesthesia Checklist: Patient identified, Emergency Drugs available, Suction available and Patient being monitored Patient Re-evaluated:Patient Re-evaluated prior to induction Oxygen Delivery Method: Circle system utilized Preoxygenation: Pre-oxygenation with 100% oxygen Induction Type: IV induction Ventilation: Mask ventilation without difficulty Laryngoscope Size: McGraph and 3 Grade View: Grade I Tube type: Oral Tube size: 7.0 mm Number of attempts: 1 Airway Equipment and Method: Stylet and Oral airway Placement Confirmation: ETT inserted through vocal cords under direct vision, positive ETCO2 and breath sounds checked- equal and bilateral Secured at: 21 cm Tube secured with: Tape Dental Injury: Teeth and Oropharynx as per pre-operative assessment

## 2021-11-26 NOTE — Op Note (Signed)
Preoperative diagnosis: Persistent nipple drainage, prior biopsy showing ductal proliferation and gynecomastia and hyperplasia.  Postoperative diagnosis: Same.  Operative procedure: Excision of left retroareolar ductal structures.  Operating surgeon: Hervey Ard, MD.  Anesthesia: General endotracheal, Marcaine 0.5% with 1: 200,000 units of epinephrine, 30 cc.  Estimated blood loss: 2 cc.  Clinical note: This 71 year old male has had persistent nipple drainage.  Prior biopsy showed evidence of gynecomastia changes and ductal dilatation.  No evidence of malignancy.  He showed persistent drainage over the last 5 months with increasing diameter of the retroareolar cystic structures.  He was admitted for elective resection.  The patient received Ancef prior to the procedure.  SCD stockings for DVT prevention.  Hair was removed from the surgical site with clippers.  Operative note patient underwent general endotracheal anesthesia.  The breast was then cleansed with ChloraPrep and draped.  Marcaine was infiltrated about 4-5 cm away from the areola for postoperative analgesia.  A circumareolar incision from the 12 to 6 o'clock position was made.  The skin was incised sharply remaining dissection completed electrocautery.  A lacrimal duct probe was placed through the draining duct.  The areolar skin was elevated off the underlying tissue to the base of the nipple.  This was tagged with the "skin" marker.  The 2 cm diameter 5 cm long core of tissue and to encompass the retroareolar structures including down to the pectoralis fascia was then excised orientated and specimen radiograph confirmed the previously placed ribbon clip in place.  The deep tissue was approximated with 2-0 Vicryl figure-of-eight sutures.  The second, superficial layer was a pursestring suture.  Interrupted 2-0 Vicryl sutures were placed at the deep dermal level and the skin approximated with interrupted 4-0 Vicryl subcuticular  sutures.  Benzoin and Steri-Strips followed by Telfa and a Tegaderm dressing were applied.  Patient tolerated procedure well was taken to PACU in stable condition.

## 2021-11-26 NOTE — Anesthesia Preprocedure Evaluation (Signed)
Anesthesia Evaluation  Patient identified by MRN, date of birth, ID band Patient awake    Reviewed: Allergy & Precautions, NPO status , Patient's Chart, lab work & pertinent test results  Airway Mallampati: III  TM Distance: >3 FB Neck ROM: Full    Dental  (+) Teeth Intact   Pulmonary neg pulmonary ROS, sleep apnea ,    Pulmonary exam normal  + decreased breath sounds      Cardiovascular Exercise Tolerance: Good hypertension, Pt. on medications negative cardio ROS Normal cardiovascular exam Rhythm:Regular Rate:Normal     Neuro/Psych Anxiety Depression negative neurological ROS  negative psych ROS   GI/Hepatic negative GI ROS, Neg liver ROS, GERD  Medicated,  Endo/Other  negative endocrine ROSdiabetes, Well ControlledMorbid obesity  Renal/GU   negative genitourinary   Musculoskeletal negative musculoskeletal ROS (+)   Abdominal (+) + obese,   Peds negative pediatric ROS (+)  Hematology negative hematology ROS (+)   Anesthesia Other Findings Past Medical History: 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 No date: Anxiety No date: Depression No date: ED (erectile dysfunction) No date: History of adenomatous polyp of colon No date: History of exercise stress test     Comment:  ETT 04-17-2007 (in epic)  negative No date: Hyperlipidemia     Comment:  LDL in 120's No date: Hypertension     Comment:  followed by pcp No date: OSA (obstructive sleep apnea) No date: OSA on CPAP     Comment:  study in epic 08-11-2017 moderate osa No date: Seasonal allergic rhinitis No date: Type 2 diabetes mellitus ([place])     Comment:  followed by pcp No date: Wears glasses No date: Wears hearing aid in both ears  Past Surgical History: 06/28/2017: CATARACT EXTRACTION W/PHACO; Right     Comment:  Procedure: CATARACT EXTRACTION PHACO AND INTRAOCULAR               LENS PLACEMENT ([place]) RIGHT;  Surgeon: Leandrew Koyanagi, MD;   Location: [facility];  Service:               Ophthalmology;  Laterality: Right; 07/19/2017: CATARACT EXTRACTION W/PHACO; Left     Comment:  Procedure: CATARACT EXTRACTION PHACO AND INTRAOCULAR               LENS PLACEMENT ([place]) LEFT TOPICAL;  Surgeon: Leandrew Koyanagi, MD;  Location: [facility];  Service:               Ophthalmology;  Laterality: Left;  IVA               TOPICALLEFTDIABETIC 1980s   dr  crawford: CHOLECYSTECTOMY OPEN     Comment:  AND APPENDECTOMY last one 03-28-2018   dr pyrtle: COLONOSCOPY WITH PROPOFOL 12/20/2018: LASER ABLATION CONDOLAMATA; N/A     Comment:  Procedure: LASER ABLATION OF CONDOLAMATA, EXAMINATION               UNDER ANESTHESIA;  Surgeon: Michael Boston, MD;  Location:              [facility];  Service: General;                Laterality: N/A;  BMI    Body Mass Index: 34.21 kg/m      Reproductive/Obstetrics negative OB ROS  Anesthesia Physical Anesthesia Plan  ASA: 3  Anesthesia Plan: General   Post-op Pain Management:    Induction: Intravenous  PONV Risk Score and Plan: Dexamethasone, Ondansetron, Midazolam and Treatment may vary due to age or medical condition  Airway Management Planned: LMA  Additional Equipment:   Intra-op Plan:   Post-operative Plan: Extubation in OR  Informed Consent: I have reviewed the patients History and Physical, chart, labs and discussed the procedure including the risks, benefits and alternatives for the proposed anesthesia with the patient or authorized representative who has indicated his/her understanding and acceptance.     Dental Advisory Given  Plan Discussed with: CRNA and Surgeon  Anesthesia Plan Comments:         Anesthesia Quick Evaluation

## 2021-11-26 NOTE — H&P (Signed)
James Camacho [id number] 1950-12-12     HPI: 71 year old male with persistent nipple drainage.  Active biopsy failed to show evidence of malignancy.  For formal duct excision.  Medications Prior to Admission  Medication Sig Dispense Refill Last Dose   amLODipine (NORVASC) 10 MG tablet TAKE 1 TABLET BY MOUTH EVERY DAY 90 tablet 3 11/25/2021   ASPIRIN 81 PO Take by mouth.   11/25/2021   atorvastatin (LIPITOR) 10 MG tablet TAKE 1 TABLET BY MOUTH EVERY DAY 90 tablet 3 11/25/2021   carvedilol (COREG) 12.5 MG tablet TAKE 1 TABLET (12.5 MG TOTAL) BY MOUTH 2 (TWO) TIMES DAILY WITH A MEAL. 180 tablet 3 11/25/2021   citalopram (CELEXA) 20 MG tablet TAKE 1 TABLET BY MOUTH EVERY DAY. 90 tablet 3 11/25/2021   furosemide (LASIX) 40 MG tablet TAKE 1 TABLET BY MOUTH EVERY DAY 90 tablet 3 11/25/2021   lisinopril (ZESTRIL) 40 MG tablet Take 1 tablet (40 mg total) by mouth daily. 90 tablet 3 11/25/2021   omeprazole (PRILOSEC) 20 MG capsule TAKE 1 CAPSULE BY MOUTH ONCE DAILY 90 capsule 3 11/25/2021   tadalafil (CIALIS) 20 MG tablet Take 0.5-1 tablets (10-20 mg total) by mouth every other day as needed for erectile dysfunction. 10 tablet 11 Past Week   Continuous Blood Gluc Sensor (FREESTYLE LIBRE 2 SENSOR) MISC 1 each by Does not apply route every 14 (fourteen) days. 2 each 5    empagliflozin (JARDIANCE) 10 MG TABS tablet Take 1 tablet (10 mg total) by mouth daily before breakfast. (Patient taking differently: Take 10 mg by mouth daily before breakfast. Has not started yet) 90 tablet 3    fluticasone (FLONASE) 50 MCG/ACT nasal spray SPRAY 2 SPRAYS INTO EACH NOSTRIL EVERY DAY 48 mL 3 11/12/2021   Insulin Pen Needle (PEN NEEDLES) 32G X 6 MM MISC 1 each by Does not apply route daily. Use with Victoza once a day 50 each 11    Semaglutide, 2 MG/DOSE, (OZEMPIC, 2 MG/DOSE,) 8 MG/3ML SOPN INJECT '2MG'$  AS DIRECTED ONCE PER WEEK 3 mL 11 11/14/2021   Allergies  Allergen Reactions   Aleve [Naproxen Sodium] Hives   Shrimp  (Diagnostic) Other (See Comments)    Itching and dry mouth   Other     Pt can only take Tylenol for pain- anything prescribed that he has tried breaks him out in a rash.    Penicillin G Rash   Penicillins Rash   Past Medical History:  Diagnosis Date   Anal condylomata    Anxiety    Depression    ED (erectile dysfunction)    History of adenomatous polyp of colon    History of exercise stress test    ETT 04-17-2007 (in epic)  negative   Hyperlipidemia    LDL in 120's   Hypertension    followed by pcp   OSA (obstructive sleep apnea)    OSA on CPAP    study in epic 08-11-2017 moderate osa   Seasonal allergic rhinitis    Type 2 diabetes mellitus ([place])    followed by pcp   Wears glasses    Wears hearing aid in both ears    Past Surgical History:  Procedure Laterality Date   CATARACT EXTRACTION W/PHACO Right 06/28/2017   Procedure: CATARACT EXTRACTION PHACO AND INTRAOCULAR LENS PLACEMENT ([place]) RIGHT;  Surgeon: Leandrew Koyanagi, MD;  Location: [facility];  Service: Ophthalmology;  Laterality: Right;   CATARACT EXTRACTION W/PHACO Left 07/19/2017   Procedure: CATARACT EXTRACTION PHACO AND INTRAOCULAR LENS PLACEMENT (  [facility]) LEFT TOPICAL;  Surgeon: Leandrew Koyanagi, MD;  Location: [facility];  Service: Ophthalmology;  Laterality: Left;  IVA TOPICALLEFTDIABETIC   CHOLECYSTECTOMY OPEN  1980s   dr  Sharlet Salina   AND APPENDECTOMY   COLONOSCOPY WITH PROPOFOL  last one 03-28-2018   dr Hilarie Fredrickson   LASER ABLATION CONDOLAMATA N/A 12/20/2018   Procedure: LASER ABLATION OF CONDOLAMATA, EXAMINATION UNDER ANESTHESIA;  Surgeon: Michael Boston, MD;  Location: [facility];  Service: General;  Laterality: N/A;   Social History   Socioeconomic History   Marital status: Married    Spouse name: Not on file   Number of children: 3   Years of education: Not on file   Highest education level: Not on file  Occupational History   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   Occupation: Engineer, site business    Comment:    Tobacco Use   Smoking status: Never    Passive exposure: Never   Smokeless tobacco: Never  Vaping Use   Vaping Use: Never used  Substance and Sexual Activity   Alcohol use: Not Currently    Comment: rare   Drug use: Never   Sexual activity: Not on file  Other Topics Concern   Not on file  Social History Narrative   Has living will   2 daughters should now be his health care POA   Would accept resuscitation   No prolonged tube feeds   Social Determinants of Health   Financial Resource Strain: Not on file  Food Insecurity: Not on file  Transportation Needs: Not on file  Physical Activity: Not on file  Stress: Not on file  Social Connections: Not on file  Intimate Partner Violence: Not on file   Social History   Social History Narrative   Has living will   2 daughters should now be his health care POA   Would accept resuscitation   No prolonged tube feeds     ROS: Negative.     PE: HEENT: Negative. Lungs: Clear. Cardio: RR.    Assessment/Plan:  Excision left retroareolar ductal structures. [facility] 11/26/2021

## 2021-11-26 NOTE — Transfer of Care (Signed)
Immediate Anesthesia Transfer of Care Note  Patient: James Camacho  Procedure(s) Performed: EXCISION DUCTAL SYSTEM BREAST (Left)  Patient Location: PACU  Anesthesia Type:General  Level of Consciousness: drowsy  Airway & Oxygen Therapy: Patient Spontanous Breathing and Patient connected to face mask oxygen  Post-op Assessment: Report given to RN and Post -op Vital signs reviewed and stable  Post vital signs: stable  Last Vitals:  Vitals Value Taken Time  BP 137/73 11/26/21 1300  Temp    Pulse 89 11/26/21 1304  Resp 8 11/26/21 1304  SpO2 99 % 11/26/21 1304  Vitals shown include unvalidated device data.  Last Pain:  Vitals:   11/26/21 1125  TempSrc: Temporal  PainSc: 0-No pain         Complications: No notable events documented.

## 2021-11-27 ENCOUNTER — Encounter: Payer: Self-pay | Admitting: General Surgery

## 2021-12-01 LAB — SURGICAL PATHOLOGY

## 2021-12-07 ENCOUNTER — Encounter: Payer: Self-pay | Admitting: Internal Medicine

## 2021-12-08 ENCOUNTER — Telehealth (INDEPENDENT_AMBULATORY_CARE_PROVIDER_SITE_OTHER): Payer: HMO | Admitting: Internal Medicine

## 2021-12-08 ENCOUNTER — Encounter: Payer: Self-pay | Admitting: Internal Medicine

## 2021-12-08 DIAGNOSIS — U071 COVID-19: Secondary | ICD-10-CM | POA: Diagnosis not present

## 2021-12-08 MED ORDER — NIRMATRELVIR/RITONAVIR (PAXLOVID)TABLET: 3.0000 | ORAL_TABLET | Freq: Two times a day (BID) | ORAL | 0 refills | Status: AC

## 2021-12-08 NOTE — Progress Notes (Signed)
Subjective:    Patient ID: James Camacho, male    DOB: [birth year], 71 y.o.   MRN: [id number]  HPI Video virtual visit due to COVID exposure and illness Identification done  Reviewed limitations and billing and he gave consent Participants---patient at home and I am in my office  Wife tested positive for COVID 3 days ago---was exposed Yesterday --he started with bad cough and aching all over Did test for COVID last night--negative Bad head and chest congestion Cough is dry Not really SOB---or just a little No fever, chills or sweats Slight sore throat No ear pain  Taking mucinex and tylenol--slight help Went to health bar---IV infusion of vitamins, etc  Current Outpatient Medications on File Prior to Visit  Medication Sig Dispense Refill   amLODipine (NORVASC) 10 MG tablet TAKE 1 TABLET BY MOUTH EVERY DAY 90 tablet 3   ASPIRIN 81 PO Take by mouth.     atorvastatin (LIPITOR) 10 MG tablet TAKE 1 TABLET BY MOUTH EVERY DAY 90 tablet 3   carvedilol (COREG) 12.5 MG tablet TAKE 1 TABLET (12.5 MG TOTAL) BY MOUTH 2 (TWO) TIMES DAILY WITH A MEAL. 180 tablet 3   citalopram (CELEXA) 20 MG tablet TAKE 1 TABLET BY MOUTH EVERY DAY. 90 tablet 3   Continuous Blood Gluc Sensor (FREESTYLE LIBRE 2 SENSOR) MISC 1 each by Does not apply route every 14 (fourteen) days. 2 each 5   empagliflozin (JARDIANCE) 10 MG TABS tablet Take 1 tablet (10 mg total) by mouth daily before breakfast. (Patient taking differently: Take 10 mg by mouth daily before breakfast. Has not started yet) 90 tablet 3   fluticasone (FLONASE) 50 MCG/ACT nasal spray SPRAY 2 SPRAYS INTO EACH NOSTRIL EVERY DAY 48 mL 3   furosemide (LASIX) 40 MG tablet TAKE 1 TABLET BY MOUTH EVERY DAY 90 tablet 3   HYDROcodone-acetaminophen (NORCO/VICODIN) 5-325 MG tablet Take 1 tablet by mouth every 4 (four) hours as needed for moderate pain. 12 tablet 0   Insulin Pen Needle (PEN NEEDLES) 32G X 6 MM MISC 1 each by Does not apply route daily. Use with  Victoza once a day 50 each 11   lisinopril (ZESTRIL) 40 MG tablet Take 1 tablet (40 mg total) by mouth daily. 90 tablet 3   omeprazole (PRILOSEC) 20 MG capsule TAKE 1 CAPSULE BY MOUTH ONCE DAILY 90 capsule 3   Semaglutide, 2 MG/DOSE, (OZEMPIC, 2 MG/DOSE,) 8 MG/3ML SOPN INJECT '2MG'$  AS DIRECTED ONCE PER WEEK 3 mL 11   tadalafil (CIALIS) 20 MG tablet Take 0.5-1 tablets (10-20 mg total) by mouth every other day as needed for erectile dysfunction. 10 tablet 11   No current facility-administered medications on file prior to visit.    Allergies  Allergen Reactions   Aleve [Naproxen Sodium] Hives   Shrimp (Diagnostic) Other (See Comments)    Itching and dry mouth   Other     Pt can only take Tylenol for pain- anything prescribed that he has tried breaks him out in a rash.    Penicillin G Rash   Penicillins Rash    Past Medical History:  Diagnosis Date   Anal condylomata    Anxiety    Depression    ED (erectile dysfunction)    History of adenomatous polyp of colon    History of exercise stress test    ETT 04-17-2007 (in epic)  negative   Hyperlipidemia    LDL in 120's   Hypertension    followed by pcp   OSA (  obstructive sleep apnea)    OSA on CPAP    study in epic 08-11-2017 moderate osa   Seasonal allergic rhinitis    Type 2 diabetes mellitus ([place])    followed by pcp   Wears glasses    Wears hearing aid in both ears     Past Surgical History:  Procedure Laterality Date   BREAST DUCTAL SYSTEM EXCISION Left 11/26/2021   Procedure: EXCISION DUCTAL SYSTEM BREAST;  Surgeon: Robert Bellow, MD;  Location: [facility];  Service: General;  Laterality: Left;   CATARACT EXTRACTION W/PHACO Right 06/28/2017   Procedure: CATARACT EXTRACTION PHACO AND INTRAOCULAR LENS PLACEMENT ([place]) RIGHT;  Surgeon: Leandrew Koyanagi, MD;  Location: [facility];  Service: Ophthalmology;  Laterality: Right;   CATARACT EXTRACTION W/PHACO Left 07/19/2017   Procedure: CATARACT EXTRACTION PHACO AND  INTRAOCULAR LENS PLACEMENT ([place]) LEFT TOPICAL;  Surgeon: Leandrew Koyanagi, MD;  Location: [place];  Service: Ophthalmology;  Laterality: Left;  IVA TOPICALLEFTDIABETIC   CHOLECYSTECTOMY OPEN  1980s   dr  Sharlet Salina   AND APPENDECTOMY   COLONOSCOPY WITH PROPOFOL  last one 03-28-2018   dr Hilarie Fredrickson   LASER ABLATION CONDOLAMATA N/A 12/20/2018   Procedure: LASER ABLATION OF CONDOLAMATA, EXAMINATION UNDER ANESTHESIA;  Surgeon: Michael Boston, MD;  Location: [facility];  Service: General;  Laterality: N/A;    Family History  Problem Relation Age of Onset   Diabetes Mother    Arthritis Mother    Hypertension Mother    Chronic Renal Failure Mother    Parkinsonism Father    Heart disease Father    Healthy Sister    Healthy Sister    Healthy Brother    Cancer Neg Hx        no colon or prostate cancer   Colon cancer Neg Hx    Esophageal cancer Neg Hx    Stomach cancer Neg Hx    Rectal cancer Neg Hx    Breast cancer Neg Hx     Social History   Socioeconomic History   Marital status: Married    Spouse name: Not on file   Number of children: 3   Years of education: Not on file   Highest education level: Not on file  Occupational History   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   Occupation: Engineer, site business    Comment:    Tobacco Use   Smoking status: Never    Passive exposure: Never   Smokeless tobacco: Never  Vaping Use   Vaping Use: Never used  Substance and Sexual Activity   Alcohol use: Not Currently    Comment: rare   Drug use: Never   Sexual activity: Not on file  Other Topics Concern   Not on file  Social History Narrative   Has living will   2 daughters should now be his health care POA   Would accept resuscitation   No prolonged tube feeds   Social Determinants of Health   Financial Resource Strain: Not on file  Food Insecurity: Not on file  Transportation Needs: Not  on file  Physical Activity: Not on file  Stress: Not on file  Social Connections: Not on file  Intimate Partner Violence: Not on file   Review of Systems No N/V Appetite off --but eating a little No trouble drinking      Objective:   Physical Exam Constitutional:      Appearance: Normal appearance.  Pulmonary:  Effort: Pulmonary effort is normal. No respiratory distress.  Neurological:     Mental Status: He is alert.            Assessment & Plan:

## 2021-12-08 NOTE — Telephone Encounter (Signed)
Spoke to pt. Made him a VV at 1115.

## 2021-12-08 NOTE — Assessment & Plan Note (Addendum)
Classic presentation and direct exposure First test negative--but I think he has it Discussed symptom Rx--tylenol, fluids, etc  Will treat with paxlovid--no atorvastatin while on it Monitor BP and hold amlodipine if low No work till next week at earliest--and only if symptoms gone  ER if sig SOB

## 2021-12-09 ENCOUNTER — Telehealth: Payer: Self-pay

## 2021-12-09 ENCOUNTER — Encounter: Payer: Self-pay | Admitting: Internal Medicine

## 2021-12-09 NOTE — Telephone Encounter (Signed)
Prior auth started for Ozempic (2 MG/DOSE) '8MG'$ /3ML pen-injectors. Jarrett Soho ([employer]letta Cohn) Rx #: [id number] Waiting for determination.

## 2021-12-09 NOTE — Telephone Encounter (Signed)
Prior auth for Ozempic (2 MG/DOSE) '8MG'$ /3ML pen-injectors has been approved. James Camacho ([employer]letta Cohn) Rx #: [id number] Your request has been approved 05-OCT-23:31-DEC-24 Ozempic (2 MG/DOSE) '8MG'$ /3ML [place] SOPN Quantity:3;

## 2021-12-16 ENCOUNTER — Encounter: Payer: Self-pay | Admitting: Internal Medicine

## 2021-12-17 ENCOUNTER — Ambulatory Visit
Admission: RE | Admit: 2021-12-17 | Discharge: 2021-12-17 | Disposition: A | Discharge status: Home / Self Care | Payer: HMO | Source: Ambulatory Visit

## 2021-12-17 ENCOUNTER — Ambulatory Visit (INDEPENDENT_AMBULATORY_CARE_PROVIDER_SITE_OTHER): Payer: HMO

## 2021-12-17 VITALS — BP 113/73 | HR 81 | Temp 97.7°F | Resp 15

## 2021-12-17 DIAGNOSIS — R071 Chest pain on breathing: Secondary | ICD-10-CM | POA: Diagnosis not present

## 2021-12-17 DIAGNOSIS — R079 Chest pain, unspecified: Secondary | ICD-10-CM | POA: Diagnosis not present

## 2021-12-17 DIAGNOSIS — R059 Cough, unspecified: Secondary | ICD-10-CM | POA: Diagnosis not present

## 2021-12-17 DIAGNOSIS — R0602 Shortness of breath: Secondary | ICD-10-CM | POA: Diagnosis not present

## 2021-12-17 DIAGNOSIS — J209 Acute bronchitis, unspecified: Secondary | ICD-10-CM | POA: Diagnosis not present

## 2021-12-17 MED ORDER — LEVOFLOXACIN 500 MG PO TABS: 500.0000 mg | ORAL_TABLET | Freq: Every day | ORAL | 0 refills | Status: DC

## 2021-12-17 NOTE — Telephone Encounter (Signed)
James Camacho with access nurse said that pt has had covid and at times pt coughs so much he cannot catch his breath, pt gets lightheaded and feels like might pass out (pt has not passed out yet). Pt has wheezing also. No available appts at [facility] or [facility]. I scheduled pt an appt at [facility] 12/17/21 at 4:15 with UC & ED precautions and pt voiced understanding and appreciative of appt. Sending note to Dr Silvio Pate who is out of office as FYI to PCP.     [facility] - Client TELEPHONE ADVICE RECORD AccessNurse Patient Name: James Camacho Gender: Male DOB: 05/01/1950 Age: 71 Y 78 M 1 D Return Phone Number: [phone] (Primary) Address: [address] City/ State/ Zip: [place]  [address] Client [place] [facility] - Client Client Site [place] - Day Provider Viviana Simpler- MD Contact Type Call Who Is Calling Patient / Member / Family / Caregiver Call Type Triage / Clinical Relationship To Patient Self Return Phone Number [phone] (Primary) Chief Complaint BREATHING - shortness of breath or sounds breathless Reason for Call Symptomatic / Request for [place] was transferred from the office, patient is having trouble breathing, he had COVID, and he is still having the cough. Translation No Nurse Assessment Nurse: Ellery Plunk, Camacho, Danica Date/Time (Eastern Time): 12/17/2021 10:50:51 AM Confirm and document reason for call. If symptomatic, describe symptoms. ---caller states he had COVID last week and has been coughing so much it is hard to catch his breath, is congested. taking mucinex but not helping. coughing so much it is giving him a headache and making his sides sore. Does the patient have any new or worsening symptoms? ---Yes Will a triage be completed? ---Yes Related visit to physician within the last 2 weeks? ---No Does the PT have any  chronic conditions? (i.e. diabetes, asthma, this includes High risk factors for pregnancy, etc.) ---No Is this a behavioral health or substance abuse call? ---No Guidelines Guideline Title Affirmed Question Affirmed Notes Nurse Date/Time Eilene Ghazi Time) Cough - Acute Productive Wheezing is present Bringas, Camacho, [place] 12/17/2021 10:53:27 AM Disp. Time Eilene Ghazi Time) Disposition Final User 12/17/2021 10:49:00 AM Send to Urgent [place] 12/17/2021 11:00:00 AM See HCP within 4 Hours (or PCP triage) Yes Bringas, Camacho, Danica PLEASE NOTE: All timestamps contained within this report are represented as [place] Standard Time. CONFIDENTIALTY NOTICE: This fax transmission is intended only for the addressee. It contains information that is legally privileged, confidential or otherwise protected from use or disclosure. If you are not the intended recipient, you are strictly prohibited from reviewing, disclosing, copying using or disseminating any of this information or taking any action in reliance on or regarding this information. If you have received this fax in error, please notify us immediately by telephone so that we can arrange for its return to [place]. Phone: [phone], Toll-Free: [phone], Fax: [phone] Page: 2 of 2 Call Id: [id number] Final Disposition 12/17/2021 11:00:00 AM See HCP within 4 Hours (or PCP triage) Yes Bringas, Camacho, Danica Caller Disagree/Comply Comply Caller Understands Yes PreDisposition InappropriateToAsk Care Advice Given Per Guideline SEE HCP (OR PCP TRIAGE) WITHIN 4 HOURS: * IF OFFICE WILL BE OPEN: You need to be seen within the next 3 or 4 hours. Call your doctor (or NP/PA) now or as soon as the office opens. CALL BACK IF: * You become worse CARE ADVICE given per Cough - Acute Productive (Adult)  guideline. Referrals [facility] transfer to [facility]

## 2021-12-17 NOTE — ED Triage Notes (Signed)
Pt presents to UC w/ c/o a cough for the past week and a half after a positive Covid diagnoses. Pt. Also c/o weakness and dizziness for the past 2 days.

## 2021-12-17 NOTE — ED Provider Notes (Signed)
James Camacho    CSN: [id number] Arrival date & time: 12/17/21  1609      History   Chief Complaint Chief Complaint  Patient presents with   Cough    Entered by patient   Dizziness   Weakness    HPI James Camacho is a 71 y.o. male.    Cough Dizziness Associated symptoms: weakness   Weakness Associated symptoms: cough and dizziness     Patient presents to UC with complaint of cough x1.5 weeks.  He states he was diagnosed with COVID along with his wife a week ago Sunday (12 days).  He states his wife recovered but he continues to have profound weakness and dizziness along with the cough.  He endorses chest pain which is worse with breathing and coughing.  Past Medical History:  Diagnosis Date   Anal condylomata    Anxiety    Depression    ED (erectile dysfunction)    History of adenomatous polyp of colon    History of exercise stress test    ETT 04-17-2007 (in epic)  negative   Hyperlipidemia    LDL in 120's   Hypertension    followed by pcp   OSA (obstructive sleep apnea)    OSA on CPAP    study in epic 08-11-2017 moderate osa   Seasonal allergic rhinitis    Type 2 diabetes mellitus ([place])    followed by pcp   Wears glasses    Wears hearing aid in both ears     Patient Active Problem List   Diagnosis Date Noted   COVID-19 virus infection 12/08/2021   Hypertension 11/24/2021   Galactorrhea 04/28/2021   GERD (gastroesophageal reflux disease) 02/20/2019   OSA (obstructive sleep apnea)    Advance directive discussed with patient 06/20/2017   Type 2 diabetes mellitus with other circulatory complications ([place]) 08/28/7626   Dyslipidemia    Routine general medical examination at a health care facility 01/11/2011   Sleep disturbance 07/06/2010   Mood disorder ([place]) 07/06/2007   Essential hypertension, benign 07/06/2007   ERECTILE DYSFUNCTION, 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    Past Surgical History:  Procedure Laterality Date   BREAST DUCTAL SYSTEM  EXCISION Left 11/26/2021   Procedure: EXCISION DUCTAL SYSTEM BREAST;  Surgeon: Robert Bellow, MD;  Location: [facility];  Service: General;  Laterality: Left;   CATARACT EXTRACTION W/PHACO Right 06/28/2017   Procedure: CATARACT EXTRACTION PHACO AND INTRAOCULAR LENS PLACEMENT ([place]) RIGHT;  Surgeon: Leandrew Koyanagi, MD;  Location: [place];  Service: Ophthalmology;  Laterality: Right;   CATARACT EXTRACTION W/PHACO Left 07/19/2017   Procedure: CATARACT EXTRACTION PHACO AND INTRAOCULAR LENS PLACEMENT ([place]) LEFT TOPICAL;  Surgeon: Leandrew Koyanagi, MD;  Location: [place];  Service: Ophthalmology;  Laterality: Left;  IVA TOPICALLEFTDIABETIC   CHOLECYSTECTOMY OPEN  1980s   dr  Sharlet Salina   AND APPENDECTOMY   COLONOSCOPY WITH PROPOFOL  last one 03-28-2018   dr Hilarie Fredrickson   LASER ABLATION CONDOLAMATA N/A 12/20/2018   Procedure: LASER ABLATION OF CONDOLAMATA, EXAMINATION UNDER ANESTHESIA;  Surgeon: Michael Boston, MD;  Location: [place];  Service: General;  Laterality: N/A;       Home Medications    Prior to Admission medications   Medication Sig Start Date End Date Taking? Authorizing Provider  amLODipine (NORVASC) 10 MG tablet Take 1 tablet by mouth daily. 01/20/21  Yes [provider]  atorvastatin (LIPITOR) 10 MG tablet Take 1 tablet by mouth daily. 01/20/21  Yes [provider]  fluticasone (  FLONASE) 50 MCG/ACT nasal spray Place into the nose. 08/17/20  Yes [provider]  lisinopril (ZESTRIL) 40 MG tablet Take 1 tablet by mouth daily. 01/20/21  Yes [provider]  omeprazole (PRILOSEC) 20 MG capsule Take 1 capsule by mouth daily. 09/22/20  Yes [provider]  amLODipine (NORVASC) 10 MG tablet TAKE 1 TABLET BY MOUTH EVERY DAY 01/20/21   Venia Carbon, MD  aspirin 81 MG chewable tablet Chew by mouth.    [provider]  ASPIRIN 81 PO Take by mouth.    [provider]  atorvastatin  (LIPITOR) 10 MG tablet TAKE 1 TABLET BY MOUTH EVERY DAY 01/20/21   Viviana Simpler I, MD  carvedilol (COREG) 12.5 MG tablet TAKE 1 TABLET (12.5 MG TOTAL) BY MOUTH 2 (TWO) TIMES DAILY WITH A MEAL. 04/05/21   Venia Carbon, MD  citalopram (CELEXA) 20 MG tablet TAKE 1 TABLET BY MOUTH EVERY DAY. 11/16/21   Venia Carbon, MD  Continuous Blood Gluc Sensor (FREESTYLE LIBRE 2 SENSOR) MISC 1 each by Does not apply route every 14 (fourteen) days. 03/26/21   Venia Carbon, MD  empagliflozin (JARDIANCE) 10 MG TABS tablet Take 1 tablet (10 mg total) by mouth daily before breakfast. Patient taking differently: Take 10 mg by mouth daily before breakfast. Has not started yet 11/19/21   Venia Carbon, MD  FARXIGA 5 MG TABS tablet Take 5 mg by mouth daily. 12/07/21   [provider]  fluticasone (FLONASE) 50 MCG/ACT nasal spray SPRAY 2 SPRAYS INTO EACH NOSTRIL EVERY DAY 09/20/21   Venia Carbon, MD  furosemide (LASIX) 40 MG tablet TAKE 1 TABLET BY MOUTH EVERY DAY 01/20/21   Venia Carbon, MD  HYDROcodone-acetaminophen (NORCO/VICODIN) 5-325 MG tablet Take 1 tablet by mouth every 4 (four) hours as needed for moderate pain. 11/26/21 11/26/22  Robert Bellow, MD  Insulin Pen Needle (PEN NEEDLES) 32G X 6 MM MISC 1 each by Does not apply route daily. Use with Victoza once a day 09/05/18   Venia Carbon, MD  lisinopril (ZESTRIL) 40 MG tablet Take 1 tablet (40 mg total) by mouth daily. 11/16/21   Venia Carbon, MD  omeprazole (PRILOSEC) 20 MG capsule TAKE 1 CAPSULE BY MOUTH ONCE DAILY 11/16/21   Venia Carbon, MD  Semaglutide, 2 MG/DOSE, (OZEMPIC, 2 MG/DOSE,) 8 MG/3ML SOPN INJECT '2MG'$  AS DIRECTED ONCE PER WEEK 11/01/21   Venia Carbon, MD  tadalafil (CIALIS) 20 MG tablet Take 0.5-1 tablets (10-20 mg total) by mouth every other day as needed for erectile dysfunction. 07/09/21   Venia Carbon, MD    Family History Family History  Problem Relation Age of Onset   Diabetes Mother     Arthritis Mother    Hypertension Mother    Chronic Renal Failure Mother    Parkinsonism Father    Heart disease Father    Healthy Sister    Healthy Sister    Healthy Brother    Cancer Neg Hx        no colon or prostate cancer   Colon cancer Neg Hx    Esophageal cancer Neg Hx    Stomach cancer Neg Hx    Rectal cancer Neg Hx    Breast cancer Neg Hx     Social History Social History   Tobacco Use   Smoking status: Never    Passive exposure: Never   Smokeless tobacco: Never  Vaping Use   Vaping Use: Never used  Substance Use Topics   Alcohol use: Not Currently    Comment: rare   Drug use: Never     Allergies   Aleve [naproxen sodium], Shrimp (diagnostic), Other, Penicillin g, and Penicillins   Review of Systems Review of Systems  Respiratory:  Positive for cough.   Neurological:  Positive for dizziness and weakness.     Physical Exam Triage Vital Signs ED Triage Vitals [12/17/21 1625]  Enc Vitals Group     BP 113/73     Pulse Rate 81     Resp 15     Temp 97.7 F (36.5 C)     Temp src      SpO2 95 %     Weight      Height      Head Circumference      Peak Flow      Pain Score 6     Pain Loc      Pain Edu?      Excl. in [place]?    No data found.  Updated Vital Signs BP 113/73   Pulse 81   Temp 97.7 F (36.5 C)   Resp 15   SpO2 95%   Visual Acuity Right Eye Distance:   Left Eye Distance:   Bilateral Distance:    Right Eye Near:   Left Eye Near:    Bilateral Near:     Physical Exam Vitals reviewed.  Constitutional:      Appearance: Normal appearance.  Cardiovascular:     Rate and Rhythm: Normal rate and regular rhythm.     Pulses: Normal pulses.     Heart sounds: Normal heart sounds.  Pulmonary:     Effort: Pulmonary effort is normal.     Breath sounds: Normal breath sounds.  Skin:    General: Skin is warm and dry.  Neurological:     General: No focal deficit present.     Mental Status: He is alert and oriented to person, place,  and time.  Psychiatric:        Mood and Affect: Mood normal.        Behavior: Behavior normal.      UC Treatments / Results  Labs (all labs ordered are listed, but only abnormal results are displayed) Labs Reviewed - No data to display  EKG   Radiology No results found.  Procedures Procedures (including critical care time)  Medications Ordered in UC Medications - No data to display  Initial Impression / Assessment and Plan / UC Course  I have reviewed the triage vital signs and the nursing notes.  Pertinent labs & imaging results that were available during my care of the patient were reviewed by me and considered in my medical decision making (see chart for details).   Given the patient's symptoms, a chest radiograph is ordered which shows mild bronchial thickening.  Bronchitis is suspected.  Patient with DM 2 managed with Ozempic.  Most recent A1c is 5.9.  Will attempt treatment with Levaquin 500 mg daily rather than risk hyperglycemia with prednisone.    Final diagnoses:  None   Discharge Instructions   None    ED Prescriptions   None    PDMP not reviewed this encounter.   Rose Phi, [place] 12/17/21 1733

## 2021-12-17 NOTE — Discharge Instructions (Addendum)
Follow up here or with your primary care provider if your symptoms are worsening or not improving with treatment.     

## 2022-01-03 DIAGNOSIS — H18512 Endothelial corneal dystrophy, left eye: Secondary | ICD-10-CM | POA: Diagnosis not present

## 2022-01-03 DIAGNOSIS — H18511 Endothelial corneal dystrophy, right eye: Secondary | ICD-10-CM | POA: Diagnosis not present

## 2022-01-12 DIAGNOSIS — H18511 Endothelial corneal dystrophy, right eye: Secondary | ICD-10-CM | POA: Diagnosis not present

## 2022-02-11 ENCOUNTER — Encounter: Payer: Self-pay | Admitting: Internal Medicine

## 2022-02-16 ENCOUNTER — Other Ambulatory Visit: Payer: Self-pay | Admitting: Internal Medicine

## 2022-03-30 DIAGNOSIS — H18512 Endothelial corneal dystrophy, left eye: Secondary | ICD-10-CM | POA: Diagnosis not present

## 2022-03-30 DIAGNOSIS — H35351 Cystoid macular degeneration, right eye: Secondary | ICD-10-CM | POA: Diagnosis not present

## 2022-03-30 DIAGNOSIS — H18511 Endothelial corneal dystrophy, right eye: Secondary | ICD-10-CM | POA: Diagnosis not present

## 2022-04-25 ENCOUNTER — Encounter: Payer: Self-pay | Admitting: Internal Medicine

## 2022-04-27 DIAGNOSIS — H26491 Other secondary cataract, right eye: Secondary | ICD-10-CM | POA: Diagnosis not present

## 2022-04-27 DIAGNOSIS — H18512 Endothelial corneal dystrophy, left eye: Secondary | ICD-10-CM | POA: Diagnosis not present

## 2022-04-27 DIAGNOSIS — H18511 Endothelial corneal dystrophy, right eye: Secondary | ICD-10-CM | POA: Diagnosis not present

## 2022-04-27 DIAGNOSIS — H35351 Cystoid macular degeneration, right eye: Secondary | ICD-10-CM | POA: Diagnosis not present

## 2022-04-28 ENCOUNTER — Encounter: Payer: Self-pay | Admitting: Internal Medicine

## 2022-04-28 ENCOUNTER — Ambulatory Visit (INDEPENDENT_AMBULATORY_CARE_PROVIDER_SITE_OTHER): Payer: PPO | Admitting: Internal Medicine

## 2022-04-28 VITALS — BP 98/50 | HR 70 | Temp 97.7°F | Ht 68.0 in | Wt 238.0 lb

## 2022-04-28 DIAGNOSIS — E1159 Type 2 diabetes mellitus with other circulatory complications: Secondary | ICD-10-CM | POA: Diagnosis not present

## 2022-04-28 LAB — POCT GLYCOSYLATED HEMOGLOBIN (HGB A1C): Hemoglobin A1C: 6.2 % — AB (ref 4.0–5.6)

## 2022-04-28 MED ORDER — CITALOPRAM HYDROBROMIDE 40 MG PO TABS: 40.0000 mg | ORAL_TABLET | Freq: Every day | ORAL | 3 refills | Status: DC

## 2022-04-28 NOTE — Assessment & Plan Note (Signed)
Having low sugar reactions---discussed that this is puzzling given his use of ozempic and farxiga--not typically causing hypoglycemic reactions Lab Results  Component Value Date   HGBA1C 6.2 (A) 04/28/2022   Continue farxiga 5 Ozempic--cut to 29m weekly to see if that helps

## 2022-04-28 NOTE — Addendum Note (Signed)
Addended by: Viviana Simpler I on: 04/28/2022 04:29 PM   Modules accepted: Orders

## 2022-04-28 NOTE — Progress Notes (Signed)
Subjective:    Patient ID: James Camacho, male    DOB: [birth year], 72 y.o.   MRN: [id number]  HPI Here due to low sugar reactions  Using the Freestyle Libre--lots of variability Has been dropping into 50's both day and night Alarm goes off and he does have some symptoms It will recover over time  Daily averages 97-119  No chest pain or SOB  Mood is better with the increased citalopram--now 78m  Current Outpatient Medications on File Prior to Visit  Medication Sig Dispense Refill   amLODipine (NORVASC) 10 MG tablet TAKE 1 TABLET BY MOUTH EVERY DAY. 90 tablet 0   aspirin 81 MG chewable tablet Chew by mouth.     ASPIRIN 81 PO Take by mouth.     atorvastatin (LIPITOR) 10 MG tablet TAKE 1 TABLET BY MOUTH EVERY DAY. 90 tablet 0   carvedilol (COREG) 12.5 MG tablet TAKE 1 TABLET (12.5 MG TOTAL) BY MOUTH 2 (TWO) TIMES DAILY WITH A MEAL. 180 tablet 3   citalopram (CELEXA) 20 MG tablet TAKE 1 TABLET BY MOUTH EVERY DAY. 90 tablet 3   Continuous Blood Gluc Sensor (FREESTYLE LIBRE 2 SENSOR) MISC 1 each by Does not apply route every 14 (fourteen) days. 2 each 5   FARXIGA 5 MG TABS tablet Take 5 mg by mouth daily.     fluticasone (FLONASE) 50 MCG/ACT nasal spray SPRAY 2 SPRAYS INTO EACH NOSTRIL EVERY DAY 48 mL 3   furosemide (LASIX) 40 MG tablet TAKE 1 TABLET BY MOUTH EVERY DAY. 90 tablet 0   Insulin Pen Needle (PEN NEEDLES) 32G X 6 MM MISC 1 each by Does not apply route daily. Use with Victoza once a day 50 each 11   lisinopril (ZESTRIL) 40 MG tablet Take 1 tablet (40 mg total) by mouth daily. 90 tablet 3   omeprazole (PRILOSEC) 20 MG capsule TAKE 1 CAPSULE BY MOUTH ONCE DAILY 90 capsule 3   omeprazole (PRILOSEC) 20 MG capsule Take 1 capsule by mouth daily.     Semaglutide, 2 MG/DOSE, (OZEMPIC, 2 MG/DOSE,) 8 MG/3ML SOPN INJECT 2MG AS DIRECTED ONCE PER WEEK 3 mL 11   tadalafil (CIALIS) 20 MG tablet Take 0.5-1 tablets (10-20 mg total) by mouth every other day as needed for erectile  dysfunction. 10 tablet 11   No current facility-administered medications on file prior to visit.    Allergies  Allergen Reactions   Aleve [Naproxen Sodium] Hives   Shrimp (Diagnostic) Other (See Comments)    Itching and dry mouth   Shrimp Extract Allergy Skin Test Other (See Comments)    Itching and dry mouth   Other     Pt can only take Tylenol for pain- anything prescribed that he has tried breaks him out in a rash.    Penicillin G Rash   Penicillins Rash    Past Medical History:  Diagnosis Date   Anal condylomata    Anxiety    Depression    ED (erectile dysfunction)    History of adenomatous polyp of colon    History of exercise stress test    ETT 04-17-2007 (in epic)  negative   Hyperlipidemia    LDL in 120's   Hypertension    followed by pcp   OSA (obstructive sleep apnea)    OSA on CPAP    study in epic 08-11-2017 moderate osa   Seasonal allergic rhinitis    Type 2 diabetes mellitus ([place]    followed by pcp   Wears glasses  Wears hearing aid in both ears     Past Surgical History:  Procedure Laterality Date   BREAST DUCTAL SYSTEM EXCISION Left 11/26/2021   Procedure: EXCISION DUCTAL SYSTEM BREAST;  Surgeon: Robert Bellow, MD;  Location: [facility];  Service: General;  Laterality: Left;   CATARACT EXTRACTION W/PHACO Right 06/28/2017   Procedure: CATARACT EXTRACTION PHACO AND INTRAOCULAR LENS PLACEMENT ([place]) RIGHT;  Surgeon: Leandrew Koyanagi, MD;  Location: [place];  Service: Ophthalmology;  Laterality: Right;   CATARACT EXTRACTION W/PHACO Left 07/19/2017   Procedure: CATARACT EXTRACTION PHACO AND INTRAOCULAR LENS PLACEMENT ([place]) LEFT TOPICAL;  Surgeon: Leandrew Koyanagi, MD;  Location: [place];  Service: Ophthalmology;  Laterality: Left;  IVA TOPICALLEFTDIABETIC   CHOLECYSTECTOMY OPEN  1980s   dr  Sharlet Salina   AND APPENDECTOMY   COLONOSCOPY WITH PROPOFOL  last one 03-28-2018   dr Hilarie Fredrickson   LASER ABLATION CONDOLAMATA N/A 12/20/2018    Procedure: LASER ABLATION OF CONDOLAMATA, EXAMINATION UNDER ANESTHESIA;  Surgeon: Michael Boston, MD;  Location: [place];  Service: General;  Laterality: N/A;    Family History  Problem Relation Age of Onset   Diabetes Mother    Arthritis Mother    Hypertension Mother    Chronic Renal Failure Mother    Parkinsonism Father    Heart disease Father    Healthy Sister    Healthy Sister    Healthy Brother    Cancer Neg Hx        no colon or prostate cancer   Colon cancer Neg Hx    Esophageal cancer Neg Hx    Stomach cancer Neg Hx    Rectal cancer Neg Hx    Breast cancer Neg Hx     Social History   Socioeconomic History   Marital status: Married    Spouse name: Not on file   Number of children: 3   Years of education: Not on file   Highest education level: Not on file  Occupational History   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   Occupation: Engineer, site business    Comment:    Tobacco Use   Smoking status: Never    Passive exposure: Never   Smokeless tobacco: Never  Vaping Use   Vaping Use: Never used  Substance and Sexual Activity   Alcohol use: Not Currently    Comment: rare   Drug use: Never   Sexual activity: Not on file  Other Topics Concern   Not on file  Social History Narrative   Has living will   2 daughters should now be his health care POA   Would accept resuscitation   No prolonged tube feeds   Social Determinants of Health   Financial Resource Strain: Not on file  Food Insecurity: Not on file  Transportation Needs: Not on file  Physical Activity: Not on file  Stress: Not on file  Social Connections: Not on file  Intimate Partner Violence: Not on file    Review of Systems Has gained back some weight---has not been able to walk as much in the cold Working full time--and has to look after wife with dementia    Objective:   Physical Exam         Assessment  & Plan:

## 2022-04-29 MED ORDER — CITALOPRAM HYDROBROMIDE 40 MG PO TABS: 40.0000 mg | ORAL_TABLET | Freq: Every day | ORAL | 3 refills | Status: DC

## 2022-04-29 NOTE — Addendum Note (Signed)
Addended by: Pilar Grammes on: 04/29/2022 07:30 AM   Modules accepted: Orders

## 2022-05-02 DIAGNOSIS — H26491 Other secondary cataract, right eye: Secondary | ICD-10-CM | POA: Diagnosis not present

## 2022-05-12 ENCOUNTER — Encounter: Payer: Self-pay | Admitting: Internal Medicine

## 2022-05-13 MED ORDER — FREESTYLE LIBRE 2 SENSOR MISC: 1.0000 | 11 refills | Status: DC

## 2022-05-16 DIAGNOSIS — H18512 Endothelial corneal dystrophy, left eye: Secondary | ICD-10-CM | POA: Diagnosis not present

## 2022-05-16 IMAGING — MG DIGITAL DIAGNOSTIC BILAT W/ TOMO W/ CAD
6 of 10 series · 6 of 30 positions shown · non-contrast
Comparison: None.

ACR Breast Density Category a: The breast tissue is almost entirely
fatty.

CLINICAL DATA: 71-year-old male presenting for evaluation of
intermittent clear left nipple discharge for about 8-10 months.

EXAM:
DIGITAL DIAGNOSTIC BILATERAL MAMMOGRAM WITH TOMOSYNTHESIS AND CAD;
ULTRASOUND LEFT BREAST LIMITED
TECHNIQUE: Bilateral digital diagnostic mammography and breast tomosynthesis
was performed. The images were evaluated with computer-aided
detection.; Targeted ultrasound examination of the left breast was
performed.

[R MLO synth-2D]
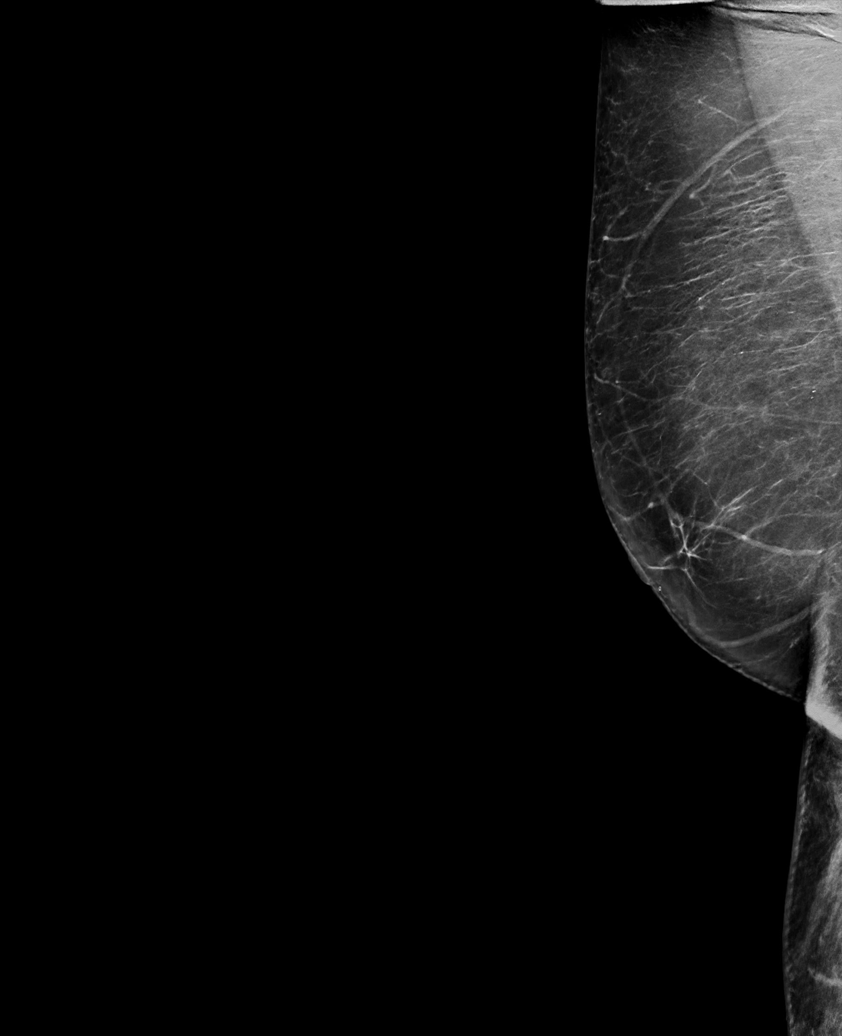

[R CC synth-2D]
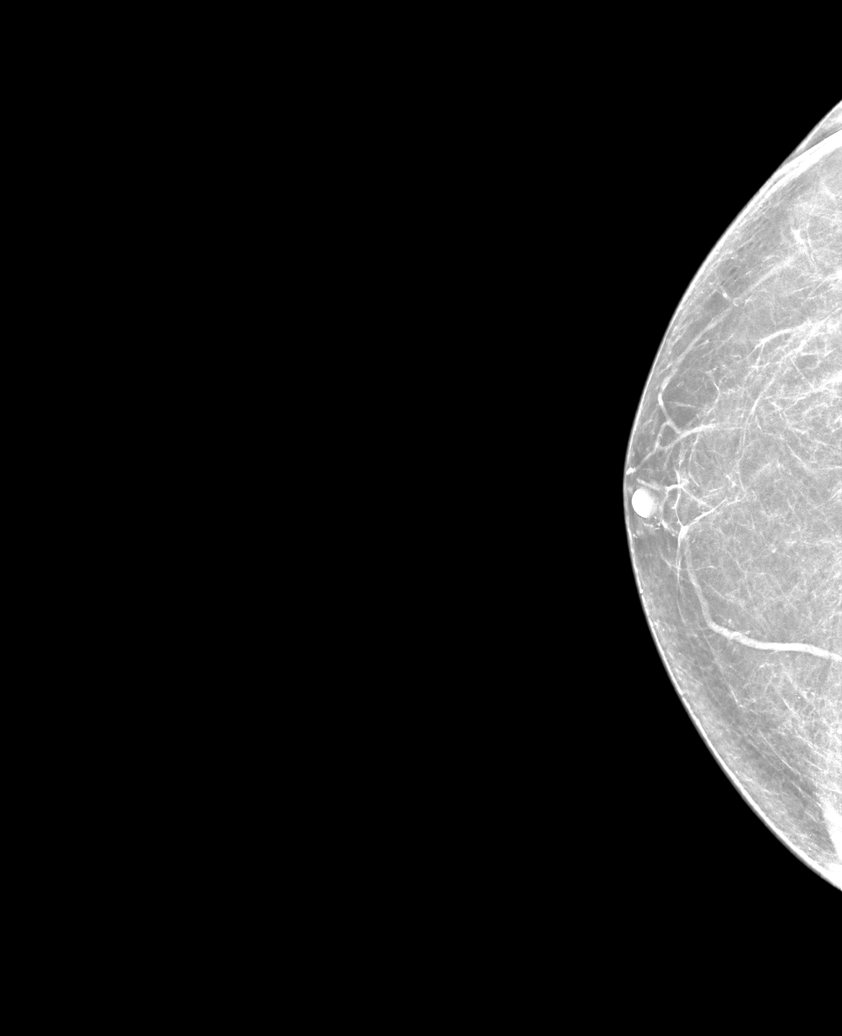

[L CC synth-2D]
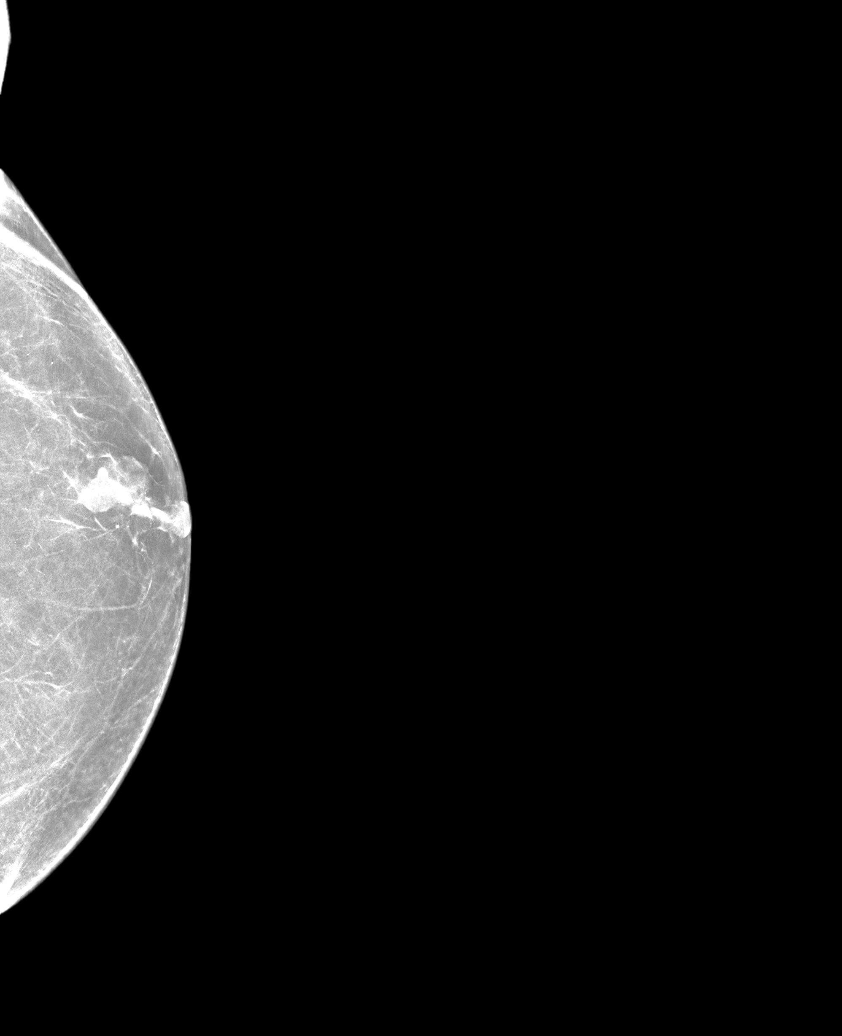

[L MLO synth-2D (1 of 2)]
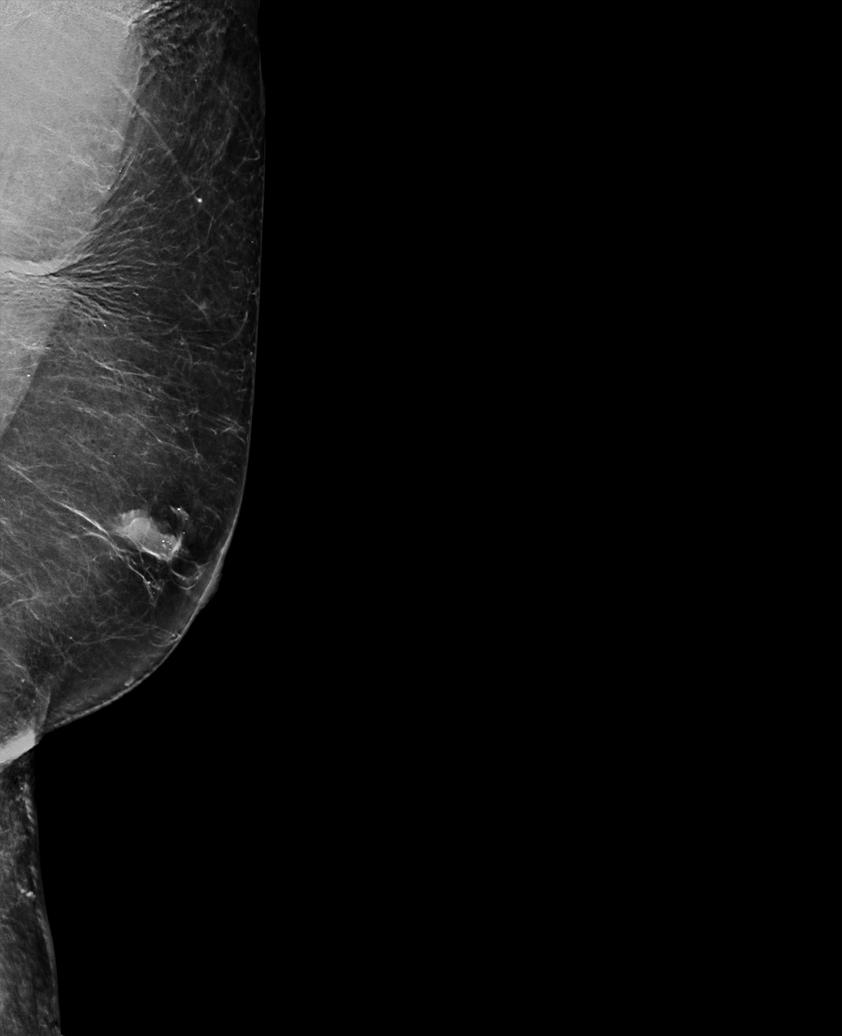

[L MLO synth-2D (2 of 2)]
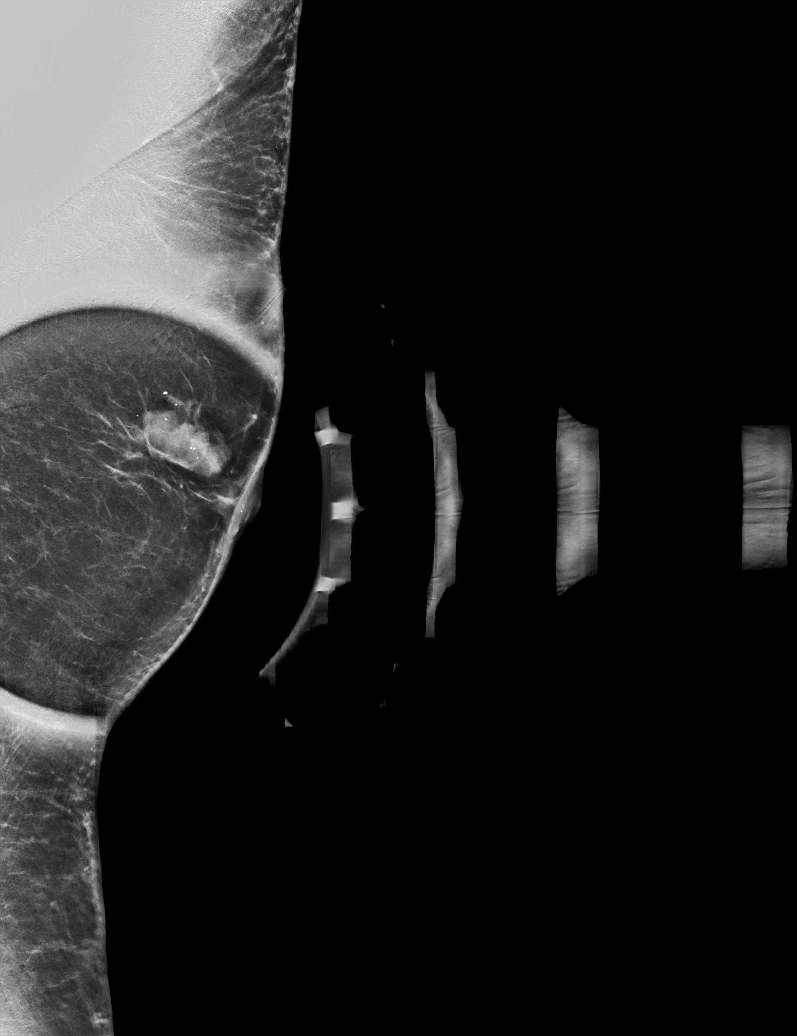

[R CC tomo · tomo slice 33/65.0]
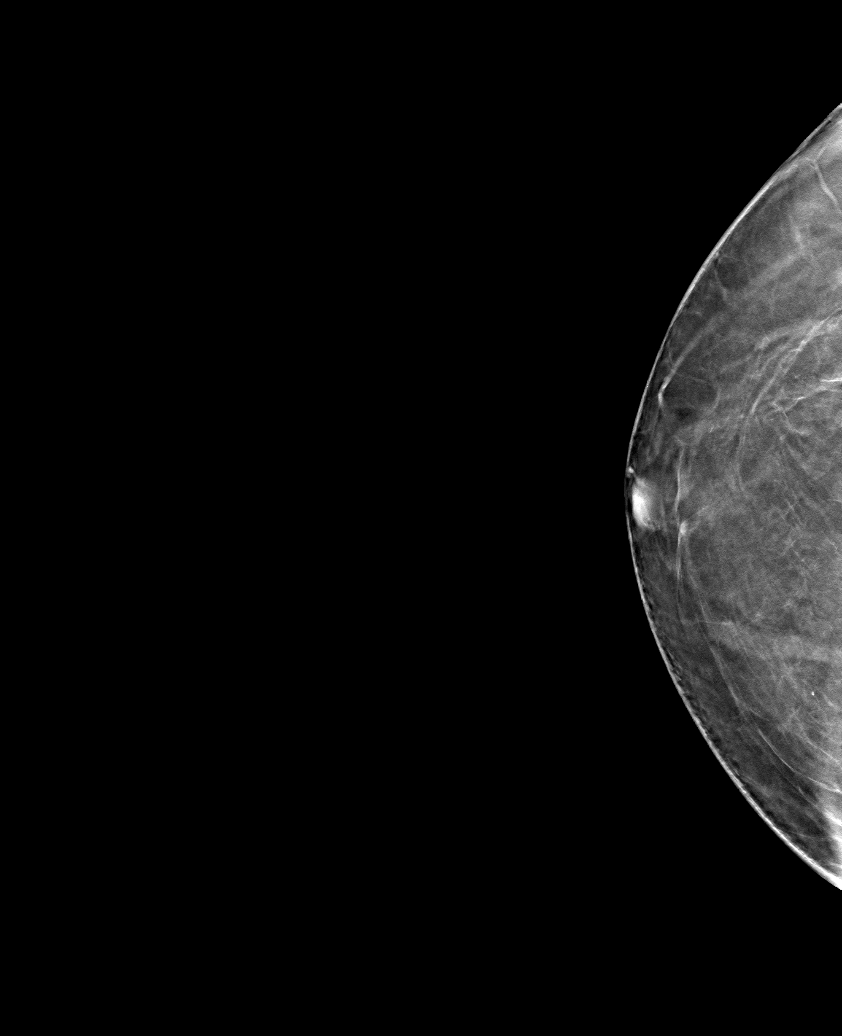

[6 of 30 positions shown; findings below may reference images not displayed]

FINDINGS: Spot compression tomosynthesis images through the retroareolar left
breast demonstrates an irregular mass with a few associated round
calcifications. The mass measures approximately 2 cm. No other
suspicious calcifications, masses or areas of distortion are seen in
the bilateral breasts.

Ultrasound targeted to the retroareolar left breast at 1 o'clock
demonstrates a cluster of anechoic oval predominantly circumscribed
cysts measuring together 2.0 x 1.0 x 1.8 cm. No clearly solid
component is identified.

Ultrasound of the left axilla demonstrates multiple normal-appearing
lymph nodes.
IMPRESSION: 1. Probable clustered cysts in the retroareolar left breast at 1
o'clock.

2.  No evidence of left axillary lymphadenopathy.

3.  No evidence of right breast malignancy.

RECOMMENDATION:
Fibrocystic change is an uncommon finding in a male, as is nipple
discharge. To be certain that the cluster of cysts in the left
breast represents only fibrocystic changes, ultrasound-guided biopsy
is recommended. If there are fluid-filled cyst that remain after
biopsy, these can be aspirated.

I have discussed the findings and recommendations with the patient.
If applicable, a reminder letter will be sent to the patient
regarding the next appointment.

BI-RADS CATEGORY  4: Suspicious.

## 2022-05-17 ENCOUNTER — Other Ambulatory Visit: Payer: Self-pay | Admitting: Internal Medicine

## 2022-05-19 ENCOUNTER — Other Ambulatory Visit: Payer: Self-pay | Admitting: Internal Medicine

## 2022-05-27 ENCOUNTER — Encounter: Payer: Self-pay | Admitting: Internal Medicine

## 2022-05-27 ENCOUNTER — Other Ambulatory Visit: Payer: Self-pay | Admitting: Internal Medicine

## 2022-05-31 ENCOUNTER — Encounter: Payer: Self-pay | Admitting: Internal Medicine

## 2022-05-31 ENCOUNTER — Ambulatory Visit (INDEPENDENT_AMBULATORY_CARE_PROVIDER_SITE_OTHER): Payer: PPO | Admitting: Internal Medicine

## 2022-05-31 VITALS — BP 112/70 | HR 74 | Temp 97.6°F | Ht 68.0 in | Wt 233.0 lb

## 2022-05-31 DIAGNOSIS — L089 Local infection of the skin and subcutaneous tissue, unspecified: Secondary | ICD-10-CM | POA: Diagnosis not present

## 2022-05-31 DIAGNOSIS — L723 Sebaceous cyst: Secondary | ICD-10-CM

## 2022-05-31 MED ORDER — CEPHALEXIN 500 MG PO CAPS: 500.0000 mg | ORAL_CAPSULE | Freq: Three times a day (TID) | ORAL | 1 refills | Status: DC

## 2022-05-31 NOTE — Progress Notes (Signed)
Subjective:    Patient ID: James Camacho, male    DOB: Jul 18, 1950, 72 y.o.   MRN: [id number]  HPI Here due to a boil on his back  Has had cysts before--but hasn't noticed in a while Son had cysts that would come up with shrimp  Now aware of this one for a month Felt like a little knot--now like a "boil" Some pain No drainage  Tried hot compresses---not helping  Current Outpatient Medications on File Prior to Visit  Medication Sig Dispense Refill   amLODipine (NORVASC) 10 MG tablet TAKE 1 TABLET BY MOUTH EVERY DAY. 90 tablet 3   aspirin 81 MG chewable tablet Chew by mouth.     ASPIRIN 81 PO Take by mouth.     atorvastatin (LIPITOR) 10 MG tablet TAKE 1 TABLET BY MOUTH EVERY DAY. 90 tablet 3   carvedilol (COREG) 12.5 MG tablet TAKE 1 TABLET BY MOUTH TWICE A DAY WITH MEALS. 180 tablet 3   citalopram (CELEXA) 40 MG tablet Take 1 tablet (40 mg total) by mouth daily. 90 tablet 3   Continuous Blood Gluc Sensor (FREESTYLE LIBRE 2 SENSOR) MISC 1 each by Does not apply route every 14 (fourteen) days. 2 each 11   FARXIGA 5 MG TABS tablet Take 5 mg by mouth daily.     fluticasone (FLONASE) 50 MCG/ACT nasal spray SPRAY 2 SPRAYS INTO EACH NOSTRIL EVERY DAY 48 mL 3   furosemide (LASIX) 40 MG tablet TAKE 1 TABLET BY MOUTH EVERY DAY. 90 tablet 3   Insulin Pen Needle (PEN NEEDLES) 32G X 6 MM MISC 1 each by Does not apply route daily. Use with Victoza once a day 50 each 11   lisinopril (ZESTRIL) 40 MG tablet Take 1 tablet (40 mg total) by mouth daily. 90 tablet 3   omeprazole (PRILOSEC) 20 MG capsule TAKE 1 CAPSULE BY MOUTH ONCE DAILY 90 capsule 3   Semaglutide, 2 MG/DOSE, (OZEMPIC, 2 MG/DOSE,) 8 MG/3ML SOPN INJECT 2MG  AS DIRECTED ONCE PER WEEK 3 mL 11   tadalafil (CIALIS) 20 MG tablet Take 0.5-1 tablets (10-20 mg total) by mouth every other day as needed for erectile dysfunction. 10 tablet 11   No current facility-administered medications on file prior to visit.    Allergies  Allergen  Reactions   Aleve [Naproxen Sodium] Hives   Shrimp (Diagnostic) Other (See Comments)    Itching and dry mouth   Shrimp Extract Other (See Comments)    Itching and dry mouth   Other     Pt can only take Tylenol for pain- anything prescribed that he has tried breaks him out in a rash.    Penicillin G Rash   Penicillins Rash    Past Medical History:  Diagnosis Date   Anal condylomata    Anxiety    Depression    ED (erectile dysfunction)    History of adenomatous polyp of colon    History of exercise stress test    ETT 04-17-2007 (in epic)  negative   Hyperlipidemia    LDL in 120's   Hypertension    followed by pcp   OSA (obstructive sleep apnea)    OSA on CPAP    study in epic 08-11-2017 moderate osa   Seasonal allergic rhinitis    Type 2 diabetes mellitus ([place])    followed by pcp   Wears glasses    Wears hearing aid in both ears     Past Surgical History:  Procedure Laterality Date   BREAST  DUCTAL SYSTEM EXCISION Left 11/26/2021   Procedure: EXCISION DUCTAL SYSTEM BREAST;  Surgeon: Robert Bellow, MD;  Location: [facility];  Service: General;  Laterality: Left;   CATARACT EXTRACTION W/PHACO Right 06/28/2017   Procedure: CATARACT EXTRACTION PHACO AND INTRAOCULAR LENS PLACEMENT ([place]) RIGHT;  Surgeon: Leandrew Koyanagi, MD;  Location: [place];  Service: Ophthalmology;  Laterality: Right;   CATARACT EXTRACTION W/PHACO Left 07/19/2017   Procedure: CATARACT EXTRACTION PHACO AND INTRAOCULAR LENS PLACEMENT ([place]) LEFT TOPICAL;  Surgeon: Leandrew Koyanagi, MD;  Location: [place];  Service: Ophthalmology;  Laterality: Left;  IVA TOPICALLEFTDIABETIC   CHOLECYSTECTOMY OPEN  1980s   dr  Sharlet Salina   AND APPENDECTOMY   COLONOSCOPY WITH PROPOFOL  last one 03-28-2018   dr Hilarie Fredrickson   LASER ABLATION CONDOLAMATA N/A 12/20/2018   Procedure: LASER ABLATION OF CONDOLAMATA, EXAMINATION UNDER ANESTHESIA;  Surgeon: Michael Boston, MD;  Location: [place];   Service: General;  Laterality: N/A;    Family History  Problem Relation Age of Onset   Diabetes Mother    Arthritis Mother    Hypertension Mother    Chronic Renal Failure Mother    Parkinsonism Father    Heart disease Father    Healthy Sister    Healthy Sister    Healthy Brother    Cancer Neg Hx        no colon or prostate cancer   Colon cancer Neg Hx    Esophageal cancer Neg Hx    Stomach cancer Neg Hx    Rectal cancer Neg Hx    Breast cancer Neg Hx     Social History   Socioeconomic History   Marital status: Married    Spouse name: Not on file   Number of children: 3   Years of education: Not on file   Highest education level: Associate degree: academic program  Occupational History   Occupation: Engineer, 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   Occupation: Engineer, site business    Comment:    Tobacco Use   Smoking status: Never    Passive exposure: Never   Smokeless tobacco: Never  Vaping Use   Vaping Use: Never used  Substance and Sexual Activity   Alcohol use: Not Currently    Comment: rare   Drug use: Never   Sexual activity: Not on file  Other Topics Concern   Not on file  Social History Narrative   Has living will   2 daughters should now be his health care POA   Would accept resuscitation   No prolonged tube feeds   Social Determinants of Health   Financial Resource Strain: Low Risk  (05/30/2022)   Overall Financial Resource Strain (CARDIA)    Difficulty of Paying Living Expenses: Not hard at all  Food Insecurity: No Food Insecurity (05/30/2022)   Hunger Vital Sign    Worried About Running Out of Food in the Last Year: Never true    Ran Out of Food in the Last Year: Never true  Transportation Needs: No Transportation Needs (05/30/2022)   PRAPARE - Hydrologist (Medical): No    Lack of Transportation (Non-Medical): No  Physical Activity: Insufficiently Active (05/30/2022)    Exercise Vital Sign    Days of Exercise per Week: 4 days    Minutes of Exercise per Session: 30 min  Stress: Stress Concern Present (05/30/2022)   [place]  Feeling of Stress : To some extent  Social Connections: Socially Integrated (05/30/2022)   Social Connection and Isolation Panel [NHANES]    Frequency of Communication with Friends and Family: More than three times a week    Frequency of Social Gatherings with Friends and Family: Twice a week    Attends Religious Services: More than 4 times per year    Active Member of [employer] or Organizations: Yes    Attends Music therapist: More than 4 times per year    Marital Status: Married  Human resources officer Violence: Not on file   Review of Systems No fever Doesn't feel sick     Objective:   Physical Exam Constitutional:      Appearance: Normal appearance.  Skin:    Comments: ~2cm mass with mild redness and warmth Just able to express a very small amount of thin pus  Neurological:     Mental Status: He is alert.            Assessment & Plan:

## 2022-05-31 NOTE — Assessment & Plan Note (Signed)
Discussed continuing the hot compresses---should be able to drain more Will give cephalexin 500 tid x 5 days

## 2022-06-01 DIAGNOSIS — H18512 Endothelial corneal dystrophy, left eye: Secondary | ICD-10-CM | POA: Diagnosis not present

## 2022-06-06 ENCOUNTER — Encounter: Payer: Self-pay | Admitting: Internal Medicine

## 2022-06-07 MED ORDER — DOXYCYCLINE HYCLATE 100 MG PO TABS: 100.0000 mg | ORAL_TABLET | Freq: Two times a day (BID) | ORAL | 0 refills | Status: DC

## 2022-06-13 MED ORDER — DOXYCYCLINE HYCLATE 100 MG PO TABS: 100.0000 mg | ORAL_TABLET | Freq: Two times a day (BID) | ORAL | 0 refills | Status: DC

## 2022-06-15 ENCOUNTER — Telehealth: Payer: Self-pay

## 2022-06-16 NOTE — Telephone Encounter (Signed)
MyChart messages.

## 2022-06-22 ENCOUNTER — Encounter: Payer: Self-pay | Admitting: Internal Medicine

## 2022-06-23 MED ORDER — DOXYCYCLINE HYCLATE 100 MG PO TABS: 100.0000 mg | ORAL_TABLET | Freq: Two times a day (BID) | ORAL | 0 refills | Status: DC

## 2022-07-12 ENCOUNTER — Ambulatory Visit (INDEPENDENT_AMBULATORY_CARE_PROVIDER_SITE_OTHER): Payer: PPO | Admitting: Internal Medicine

## 2022-07-12 ENCOUNTER — Encounter: Payer: Self-pay | Admitting: Internal Medicine

## 2022-07-12 ENCOUNTER — Encounter: Payer: PPO | Admitting: Internal Medicine

## 2022-07-12 VITALS — BP 110/76 | HR 74 | Temp 97.6°F | Ht 68.0 in | Wt 230.0 lb

## 2022-07-12 DIAGNOSIS — Z7985 Long-term (current) use of injectable non-insulin antidiabetic drugs: Secondary | ICD-10-CM

## 2022-07-12 DIAGNOSIS — F39 Unspecified mood [affective] disorder: Secondary | ICD-10-CM | POA: Diagnosis not present

## 2022-07-12 DIAGNOSIS — I10 Essential (primary) hypertension: Secondary | ICD-10-CM

## 2022-07-12 DIAGNOSIS — E1159 Type 2 diabetes mellitus with other circulatory complications: Secondary | ICD-10-CM | POA: Diagnosis not present

## 2022-07-12 DIAGNOSIS — Z0000 Encounter for general adult medical examination without abnormal findings: Secondary | ICD-10-CM

## 2022-07-12 DIAGNOSIS — Z125 Encounter for screening for malignant neoplasm of prostate: Secondary | ICD-10-CM

## 2022-07-12 DIAGNOSIS — G4733 Obstructive sleep apnea (adult) (pediatric): Secondary | ICD-10-CM

## 2022-07-12 LAB — HEMOGLOBIN A1C: Hgb A1c MFr Bld: 6.1 % (ref 4.6–6.5)

## 2022-07-12 LAB — COMPREHENSIVE METABOLIC PANEL
ALT: 38 U/L (ref 0–53)
AST: 24 U/L (ref 0–37)
Albumin: 4.2 g/dL (ref 3.5–5.2)
Alkaline Phosphatase: 71 U/L (ref 39–117)
BUN: 8 mg/dL (ref 6–23)
CO2: 31 mEq/L (ref 19–32)
Calcium: 9.3 mg/dL (ref 8.4–10.5)
Chloride: 99 mEq/L (ref 96–112)
Creatinine, Ser: 0.9 mg/dL (ref 0.40–1.50)
GFR: 86.75 mL/min (ref >60.00)
Glucose, Bld: 96 mg/dL (ref 70–99)
Potassium: 4.1 mEq/L (ref 3.5–5.1)
Sodium: 138 mEq/L (ref 135–145)
Total Bilirubin: 1.4 mg/dL — ABNORMAL HIGH (ref 0.2–1.2)
Total Protein: 7.3 g/dL (ref 6.0–8.3)

## 2022-07-12 LAB — CBC
HCT: 48.6 % (ref 39.0–52.0)
Hemoglobin: 16.2 g/dL (ref 13.0–17.0)
MCHC: 33.3 g/dL (ref 30.0–36.0)
MCV: 91.4 fl (ref 78.0–100.0)
Platelets: 207 10*3/uL (ref 150.0–400.0)
RBC: 5.32 Mil/uL (ref 4.22–5.81)
RDW: 15.7 % — ABNORMAL HIGH (ref 11.5–15.5)
WBC: 6.3 10*3/uL (ref 4.0–10.5)

## 2022-07-12 LAB — PSA, MEDICARE: PSA: 1.84 ng/ml (ref 0.10–4.00)

## 2022-07-12 LAB — LIPID PANEL
Cholesterol: 125 mg/dL (ref 0–200)
HDL: 40.7 mg/dL (ref >39.00)
LDL Cholesterol: 64 mg/dL (ref 0–99)
NonHDL: 84.58
Total CHOL/HDL Ratio: 3
Triglycerides: 105 mg/dL (ref 0.0–149.0)
VLDL: 21 mg/dL (ref 0.0–40.0)

## 2022-07-12 LAB — MICROALBUMIN / CREATININE URINE RATIO
Creatinine,U: 150.4 mg/dL
Microalb Creat Ratio: 0.8 mg/g (ref 0.0–30.0)
Microalb, Ur: 1.1 mg/dL (ref 0.0–1.9)

## 2022-07-12 LAB — HM DIABETES FOOT EXAM

## 2022-07-12 MED ORDER — CITALOPRAM HYDROBROMIDE 40 MG PO TABS: 20.0000 mg | ORAL_TABLET | Freq: Every day | ORAL | 0 refills | Status: DC

## 2022-07-12 MED ORDER — DULOXETINE HCL 30 MG PO CPEP: 30.0000 mg | ORAL_CAPSULE | Freq: Every day | ORAL | 3 refills | Status: DC

## 2022-07-12 NOTE — Progress Notes (Signed)
Hearing Screening - Comments:: Has hearing aids, not wearing them today  Vision Screening - Comments:: June 2023

## 2022-07-12 NOTE — Assessment & Plan Note (Signed)
Ongoing anxiety and some dysthymia Care giving stress--discussed getting some respite for wife with dementia Worse with decrease in citalopram Will add duloxetine 30mg  daily

## 2022-07-12 NOTE — Assessment & Plan Note (Signed)
BP Readings from Last 3 Encounters:  07/12/22 110/76  05/31/22 112/70  04/28/22 (!) 98/50   Good control on the amlodipine 10mg  daily, carvedilol 12.5 bid , furosemide 40 Due for labs

## 2022-07-12 NOTE — Assessment & Plan Note (Signed)
Sleeps with CPAP nightly with good result

## 2022-07-12 NOTE — Assessment & Plan Note (Signed)
I have personally reviewed the Medicare Annual Wellness questionnaire and have noted 1. The patient's medical and social history 2. Their use of alcohol, tobacco or illicit drugs 3. Their current medications and supplements 4. The patient's functional ability including ADL's, fall risks, home safety risks and hearing or visual             impairment. 5. Diet and physical activities 6. Evidence for depression or mood disorders  The patients weight, height, BMI and visual acuity have been recorded in the chart I have made referrals, counseling and provided education to the patient based review of the above and I have provided the pt with a written personalized care plan for preventive services.  I have provided you with a copy of your personalized plan for preventive services. Please take the time to review along with your updated medication list.  Discussed more exercise Colonoscopy due next year Will check PSA---probably the last Td at the pharmacy Keep up with COVID vaccine updates Flu/RSV in the fall

## 2022-07-12 NOTE — Progress Notes (Signed)
Subjective:    Patient ID: James Camacho, male    DOB: [birth year], 72 y.o.   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 Also had corneal replacements a few months ago. No hospitalizations Vision still cloudy--needs new corrections after surgery Hearing aides do help--needs a new set No alcohol or tobacco Still walking 3 days per week--only 20-25 minutes No falls Mild depressed mood---but mostly anxiety. Not able to do things he would enjoy due to care giving for wife Independent with instrumental ADLs No memory problems  Still has the cyst---not inflamed--but tender at times Discussed elective excision if he wishes---he will wait  Still having a lot of anxiety Lots of stress with wife's worsening dementia---she is argumentative  Did cut back on citalopram as requested---worse now  Sugars are good Has the CGM--but off in the last week Mostly 95-120  No low sugar reactions No foot numbness or burning  No chest pain  No palpitations No SOB No dizziness or syncope No edema Rare headache  Current Outpatient Medications on File Prior to Visit  Medication Sig Dispense Refill   amLODipine (NORVASC) 10 MG tablet TAKE 1 TABLET BY MOUTH EVERY DAY. 90 tablet 3   aspirin 81 MG chewable tablet Chew by mouth.     ASPIRIN 81 PO Take by mouth.     atorvastatin (LIPITOR) 10 MG tablet TAKE 1 TABLET BY MOUTH EVERY DAY. 90 tablet 3   carvedilol (COREG) 12.5 MG tablet TAKE 1 TABLET BY MOUTH TWICE A DAY WITH MEALS. 180 tablet 3   Continuous Blood Gluc Sensor (FREESTYLE LIBRE 2 SENSOR) MISC 1 each by Does not apply route every 14 (fourteen) days. 2 each 11   FARXIGA 5 MG TABS tablet Take 5 mg by mouth daily.     fluticasone  (FLONASE) 50 MCG/ACT nasal spray SPRAY 2 SPRAYS INTO EACH NOSTRIL EVERY DAY 48 mL 3   furosemide (LASIX) 40 MG tablet TAKE 1 TABLET BY MOUTH EVERY DAY. 90 tablet 3   Insulin Pen Needle (PEN NEEDLES) 32G X 6 MM MISC 1 each by Does not apply route daily. Use with Victoza once a day 50 each 11   lisinopril (ZESTRIL) 40 MG tablet Take 1 tablet (40 mg total) by mouth daily. 90 tablet 3   omeprazole (PRILOSEC) 20 MG capsule TAKE 1 CAPSULE BY MOUTH ONCE DAILY 90 capsule 3   Semaglutide, 2 MG/DOSE, (OZEMPIC, 2 MG/DOSE,) 8 MG/3ML SOPN INJECT 2MG  AS DIRECTED ONCE PER WEEK 3 mL 11   tadalafil (CIALIS) 20 MG tablet Take 0.5-1 tablets (10-20 mg total) by mouth every other day as needed for erectile dysfunction. 10 tablet 11   No current facility-administered medications on file prior to visit.    Allergies  Allergen Reactions   Aleve [Naproxen Sodium] Hives   Shrimp (Diagnostic) Other (See Comments)    Itching and dry mouth   Shrimp Extract Other (See Comments)    Itching and dry mouth   Other     Pt can only take Tylenol for pain- anything prescribed that he has tried breaks him out in a rash.    Penicillin G Rash   Penicillins Rash    Past Medical History:  Diagnosis Date   Anal condylomata    Anxiety    Depression    ED (erectile  dysfunction)    History of adenomatous polyp of colon    History of exercise stress test    ETT 04-17-2007 (in epic)  negative   Hyperlipidemia    LDL in 120's   Hypertension    followed by pcp   OSA (obstructive sleep apnea)    OSA on CPAP    study in epic 08-11-2017 moderate osa   Seasonal allergic rhinitis    Type 2 diabetes mellitus (HCC)    followed by pcp   Wears glasses    Wears hearing aid in both ears     Past Surgical History:  Procedure Laterality Date   BREAST DUCTAL SYSTEM EXCISION Left 11/26/2021   Procedure: EXCISION DUCTAL SYSTEM BREAST;  Surgeon: Earline Mayotte, MD;  Location: [facility];  Service: General;  Laterality: Left;    CATARACT EXTRACTION W/PHACO Right 06/28/2017   Procedure: CATARACT EXTRACTION PHACO AND INTRAOCULAR LENS PLACEMENT (IOC) RIGHT;  Surgeon: Lockie Mola, MD;  Location: [facility];  Service: Ophthalmology;  Laterality: Right;   CATARACT EXTRACTION W/PHACO Left 07/19/2017   Procedure: CATARACT EXTRACTION PHACO AND INTRAOCULAR LENS PLACEMENT (IOC) LEFT TOPICAL;  Surgeon: Lockie Mola, MD;  Location: [facility];  Service: Ophthalmology;  Laterality: Left;  IVA TOPICALLEFTDIABETIC   CHOLECYSTECTOMY OPEN  1980s   dr  Okey Dupre   AND APPENDECTOMY   COLONOSCOPY WITH PROPOFOL  last one 03-28-2018   dr pyrtle   Corneal replacements Bilateral    12/23 and 2/24   LASER ABLATION CONDOLAMATA N/A 12/20/2018   Procedure: LASER ABLATION OF CONDOLAMATA, EXAMINATION UNDER ANESTHESIA;  Surgeon: Karie Soda, MD;  Location: [facility] ;  Service: General;  Laterality: N/A;    Family History  Problem Relation Age of Onset   Diabetes Mother    Arthritis Mother    Hypertension Mother    Chronic Renal Failure Mother    Parkinsonism Father    Heart disease Father    Healthy Sister    Healthy Sister    Healthy Brother    Cancer Neg Hx        no colon or prostate cancer   Colon cancer Neg Hx    Esophageal cancer Neg Hx    Stomach cancer Neg Hx    Rectal cancer Neg Hx    Breast cancer Neg Hx     Social History   Socioeconomic History   Marital status: Married    Spouse name: Not on file   Number of children: 3   Years of education: Not on file   Highest education level: Associate degree: academic program  Occupational History   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 [place]    Comment: Sold and retired   Occupation: Futures trader business    Comment:    Tobacco Use   Smoking status: Never    Passive exposure: Never   Smokeless tobacco: Never  Vaping Use   Vaping Use: Never used  Substance and Sexual Activity   Alcohol use:  Not Currently    Comment: rare   Drug use: Never   Sexual activity: Not on file  Other Topics Concern   Not on file  Social History Narrative   Has living will   2 daughters should now be his health care POA   Would accept resuscitation   No prolonged tube feeds   Social Determinants of Health   Financial Resource Strain: Low Risk  (05/30/2022)   Overall Financial Resource Strain (CARDIA)    Difficulty of  Paying Living Expenses: Not hard at all  Food Insecurity: No Food Insecurity (05/30/2022)   Hunger Vital Sign    Worried About Running Out of Food in the Last Year: Never true    Ran Out of Food in the Last Year: Never true  Transportation Needs: No Transportation Needs (05/30/2022)   PRAPARE - Administrator, Civil Service (Medical): No    Lack of Transportation (Non-Medical): No  Physical Activity: Insufficiently Active (05/30/2022)   Exercise Vital Sign    Days of Exercise per Week: 4 days    Minutes of Exercise per Session: 30 min  Stress: Stress Concern Present (05/30/2022)   [employer] of Occupational Health - Occupational Stress Questionnaire    Feeling of Stress : To some extent  Social Connections: Socially Integrated (05/30/2022)   Social Connection and Isolation Panel [NHANES]    Frequency of Communication with Friends and Family: More than three times a week    Frequency of Social Gatherings with Friends and Family: Twice a week    Attends Religious Services: More than 4 times per year    Active Member of [employer] or Organizations: Yes    Attends Engineer, structural: More than 4 times per year    Marital Status: Married  Catering manager Violence: Not on file   Review of Systems Appetite still suppressed by the ozempic Weight fairly stable Wears seat belt Sleeps well Teeth are good ---keeps up with dentist No suspicious skin lesions--just some dry skin No heartburn or dysphagia Bowels move fine--no blood No sig back or joint pains     Objective:   Physical Exam Constitutional:      Appearance: Normal appearance.  HENT:     Mouth/Throat:     Pharynx: No oropharyngeal exudate or posterior oropharyngeal erythema.  Eyes:     Conjunctiva/sclera: Conjunctivae normal.     Pupils: Pupils are equal, round, and reactive to light.  Cardiovascular:     Rate and Rhythm: Normal rate and regular rhythm.     Pulses: Normal pulses.     Heart sounds: No murmur heard.    No gallop.  Pulmonary:     Effort: Pulmonary effort is normal.     Breath sounds: Normal breath sounds. No wheezing or rales.  Abdominal:     Palpations: Abdomen is soft.     Tenderness: There is no abdominal tenderness.  Musculoskeletal:     Cervical back: Neck supple.     Right lower leg: No edema.     Left lower leg: No edema.  Lymphadenopathy:     Cervical: No cervical adenopathy.  Skin:    Findings: No lesion or rash.     Comments: No foot lesions  Neurological:     General: No focal deficit present.     Mental Status: He is alert and oriented to person, place, and time.     Comments: Word naming 12/1 minute Recall 3/3 Fairly normal sensation in feet  Psychiatric:        Mood and Affect: Mood normal.        Behavior: Behavior normal.            Assessment & Plan:

## 2022-07-12 NOTE — Assessment & Plan Note (Signed)
Seems to still have good control On farxiga 5mg  daily and ozempic 2mg  weekly

## 2022-07-14 ENCOUNTER — Other Ambulatory Visit: Payer: Self-pay | Admitting: Internal Medicine

## 2022-08-30 DIAGNOSIS — Z947 Corneal transplant status: Secondary | ICD-10-CM | POA: Diagnosis not present

## 2022-09-20 ENCOUNTER — Other Ambulatory Visit: Payer: Self-pay | Admitting: Internal Medicine

## 2022-11-13 ENCOUNTER — Other Ambulatory Visit: Payer: Self-pay | Admitting: Internal Medicine

## 2022-11-18 ENCOUNTER — Other Ambulatory Visit: Payer: Self-pay | Admitting: Internal Medicine

## 2022-11-22 ENCOUNTER — Other Ambulatory Visit: Payer: Self-pay | Admitting: Internal Medicine

## 2022-11-22 DIAGNOSIS — J0110 Acute frontal sinusitis, unspecified: Secondary | ICD-10-CM

## 2022-11-28 ENCOUNTER — Emergency Department: Payer: PPO

## 2022-11-28 ENCOUNTER — Emergency Department
Admission: EM | Admit: 2022-11-28 | Discharge: 2022-11-28 | Disposition: A | Discharge status: Home / Self Care | Payer: PPO | Attending: Student in an Organized Health Care Education/Training Program | Admitting: Student in an Organized Health Care Education/Training Program

## 2022-11-28 ENCOUNTER — Other Ambulatory Visit: Payer: Self-pay

## 2022-11-28 DIAGNOSIS — K5710 Diverticulosis of small intestine without perforation or abscess without bleeding: Secondary | ICD-10-CM | POA: Diagnosis not present

## 2022-11-28 DIAGNOSIS — R0789 Other chest pain: Secondary | ICD-10-CM | POA: Diagnosis not present

## 2022-11-28 DIAGNOSIS — I10 Essential (primary) hypertension: Secondary | ICD-10-CM | POA: Insufficient documentation

## 2022-11-28 DIAGNOSIS — R079 Chest pain, unspecified: Secondary | ICD-10-CM

## 2022-11-28 DIAGNOSIS — E119 Type 2 diabetes mellitus without complications: Secondary | ICD-10-CM | POA: Diagnosis not present

## 2022-11-28 DIAGNOSIS — I959 Hypotension, unspecified: Secondary | ICD-10-CM | POA: Diagnosis not present

## 2022-11-28 LAB — TROPONIN I (HIGH SENSITIVITY)
Troponin I (High Sensitivity): 3 ng/L (ref <18)
Troponin I (High Sensitivity): 3 ng/L (ref <18)

## 2022-11-28 LAB — CBC
HCT: 48.2 % (ref 39.0–52.0)
Hemoglobin: 15.8 g/dL (ref 13.0–17.0)
MCH: 30.2 pg (ref 26.0–34.0)
MCHC: 32.8 g/dL (ref 30.0–36.0)
MCV: 92.2 fL (ref 80.0–100.0)
Platelets: 178 10*3/uL (ref 150–400)
RBC: 5.23 MIL/uL (ref 4.22–5.81)
RDW: 13.4 % (ref 11.5–15.5)
WBC: 7.7 10*3/uL (ref 4.0–10.5)
nRBC: 0 % (ref 0.0–0.2)

## 2022-11-28 LAB — BASIC METABOLIC PANEL
Anion gap: 11 (ref 5–15)
BUN: 11 mg/dL (ref 8–23)
CO2: 25 mmol/L (ref 22–32)
Calcium: 8 mg/dL — ABNORMAL LOW (ref 8.9–10.3)
Chloride: 103 mmol/L (ref 98–111)
Creatinine, Ser: 0.84 mg/dL (ref 0.61–1.24)
GFR, Estimated: >60 mL/min (ref >60)
Glucose, Bld: 125 mg/dL — ABNORMAL HIGH (ref 70–99)
Potassium: 3.3 mmol/L — ABNORMAL LOW (ref 3.5–5.1)
Sodium: 139 mmol/L (ref 135–145)

## 2022-11-28 MED ORDER — IOHEXOL 350 MG/ML SOLN
75.0000 mL | Freq: Once | INTRAVENOUS | Status: AC | PRN
  Administered 2022-11-28: 75 mL via INTRAVENOUS

## 2022-11-28 NOTE — ED Provider Notes (Signed)
[facility] Provider Note    Event Date/Time   First MD Initiated Contact with Patient 11/28/22 1611     (approximate)   History   Chest Pain   HPI  Chan Boggs is a 72 y.o. male with a history of of hypertension, diabetes, HLD, GERD presents to the ER for for evaluation chest pain and pressure near syncopal event that occurred around noon today while he was working at his desk.  States he did become diaphoretic in the pain and discomfort lasted about 45 minutes to an hour.  Did have some associated shortness of breath some nausea but no vomiting.  EMS evaluated patient was found to be mildly hypotensive was given IV fluids with improvement in blood pressure.  Denies any fevers or chills no cough.  Denies any abdominal pain.  He is currently pain-free.     Physical Exam   Triage Vital Signs: ED Triage Vitals  Encounter Vitals Group     BP 11/28/22 1354 107/66     Systolic BP Percentile --      Diastolic BP Percentile --      Pulse Rate 11/28/22 1354 94     Resp 11/28/22 1354 16     Temp 11/28/22 1354 97.9 F (36.6 C)     Temp Source 11/28/22 1354 Oral     SpO2 11/28/22 1354 95 %     Weight --      Height --      Head Circumference --      Peak Flow --      Pain Score 11/28/22 1402 0     Pain Loc --      Pain Education --      Exclude from Growth Chart --     Most recent vital signs: Vitals:   11/28/22 1354 11/28/22 1808  BP: 107/66 110/68  Pulse: 94 90  Resp: 16 16  Temp: 97.9 F (36.6 C) 98 F (36.7 C)  SpO2: 95% 96%     Constitutional: Alert  Eyes: Conjunctivae are normal.  Head: Atraumatic. Nose: No congestion/rhinnorhea. Mouth/Throat: Mucous membranes are moist.   Neck: Painless ROM.  Cardiovascular:   Good peripheral circulation. Respiratory: Normal respiratory effort.  No retractions.  Gastrointestinal: Soft and nontender.  Musculoskeletal:  no deformity Neurologic:  MAE spontaneously. No gross focal neurologic  deficits are appreciated.  Skin:  Skin is warm, dry and intact. No rash noted. Psychiatric: Mood and affect are normal. Speech and behavior are normal.    ED Results / Procedures / Treatments   Labs (all labs ordered are listed, but only abnormal results are displayed) Labs Reviewed  BASIC METABOLIC PANEL - Abnormal; Notable for the following components:      Result Value   Potassium 3.3 (*)    Glucose, Bld 125 (*)    Calcium 8.0 (*)    All other components within normal limits  CBC  TROPONIN I (HIGH SENSITIVITY)  TROPONIN I (HIGH SENSITIVITY)     EKG  ED ECG REPORT I, Willy Eddy, the attending physician, personally viewed and interpreted this ECG.   Date: 11/28/2022  EKG Time: 13:59  Rate: 70  Rhythm: sinus  Axis: normal  Intervals: normal  ST&T Change: no stemi, no depressions    RADIOLOGY Please see ED Course for my review and interpretation.  I personally reviewed all radiographic images ordered to evaluate for the above acute complaints and reviewed radiology reports and findings.  These findings were personally discussed with  the patient.  Please see medical record for radiology report.    PROCEDURES:  Critical Care performed: No  Procedures   MEDICATIONS ORDERED IN ED: Medications  iohexol (OMNIPAQUE) 350 MG/ML injection 75 mL (75 mLs Intravenous Contrast Given 11/28/22 1646)     IMPRESSION / MDM / ASSESSMENT AND PLAN / ED COURSE  I reviewed the triage vital signs and the nursing notes.                              Differential diagnosis includes, but is not limited to, ACS, pericarditis, esophagitis, boerhaaves, pe, dissection, pna, bronchitis, costochondritis  Patient presenting to the ER for evaluation of symptoms as described above.  Based on symptoms, risk factors and considered above differential, this presenting complaint could reflect a potentially life-threatening illness therefore the patient will be placed on continuous pulse  oximetry and telemetry for monitoring.  Laboratory evaluation will be sent to evaluate for the above complaints.  Patient moderate to low risk by heart score but initial troponin negative.  Will order CTA chest to further evaluate for PE not clinically consistent with dissection.   Clinical Course as of 11/28/22 2016  Mon Nov 28, 2022  0347 Patient's repeat troponin negative.  CTA on my review and interpretation without evidence of PE.  No cause of patient's chest pain identified on CT.  Given his history and risk factors I did recommend admission to the hospital for formal chest pain rule out.  Patient currently well-appearing denies any chest pain or discomfort has been up ambulating about with no discomfort.  Patient quite reluctant to be admitted to the hospital as he has an elderly wife with dementia that he has to care for her at home.  He does agree to very close outpatient follow-up and I discussed case in consultation with cardiology who does agree to help arrange close outpatient follow-up given the patient's unique circumstance.  Given his otherwise reassuring workup and as he is currently chest pain-free I do not think this is completely unreasonable but relayed my concern given his presentation and risk factors.  He demonstrates understanding of this and understands the associated risks. [PR]    Clinical Course User Index [PR] Willy Eddy, MD     FINAL CLINICAL IMPRESSION(S) / ED DIAGNOSES   Final diagnoses:  Chest pain, unspecified type     Rx / DC Orders   ED Discharge Orders          Ordered    Ambulatory referral to Cardiology       Comments: If you have not heard from the Cardiology office within the next 72 hours please call [phone].   11/28/22 1931             Note:  This document was prepared using Dragon voice recognition software and may include unintentional dictation errors.    Willy Eddy, MD 11/28/22 2016

## 2022-11-28 NOTE — ED Triage Notes (Addendum)
Pt comes via EMS from home with c/o cp that started today. Pt states weakness and dizziness. Pt was orthostatic positive. BP-103/60 and then 86/40 BS-124 HR-70s O2-98%  Pt has 20 g in left arm.  Pt states it is more pressure and lightheaded than anything. Pt states he was just sitting at desk and had the feeling in his chest. Pt states his sugar was ok and had been feeling ok

## 2022-11-29 ENCOUNTER — Other Ambulatory Visit: Payer: Self-pay | Admitting: Internal Medicine

## 2022-11-29 ENCOUNTER — Telehealth: Payer: Self-pay | Admitting: Cardiology

## 2022-11-29 NOTE — Telephone Encounter (Signed)
Pt requesting provider switch from Dr. Myriam Forehand to Dr. Mariah Milling. Please advise

## 2022-12-02 ENCOUNTER — Ambulatory Visit: Payer: PPO | Attending: Cardiovascular Disease | Admitting: Cardiovascular Disease

## 2022-12-02 ENCOUNTER — Encounter: Payer: Self-pay | Admitting: Cardiovascular Disease

## 2022-12-02 VITALS — BP 110/60 | HR 71 | Ht 68.0 in | Wt 238.4 lb

## 2022-12-02 DIAGNOSIS — I10 Essential (primary) hypertension: Secondary | ICD-10-CM

## 2022-12-02 DIAGNOSIS — G4733 Obstructive sleep apnea (adult) (pediatric): Secondary | ICD-10-CM | POA: Diagnosis not present

## 2022-12-02 DIAGNOSIS — E1159 Type 2 diabetes mellitus with other circulatory complications: Secondary | ICD-10-CM | POA: Diagnosis not present

## 2022-12-02 DIAGNOSIS — R072 Precordial pain: Secondary | ICD-10-CM | POA: Diagnosis not present

## 2022-12-02 DIAGNOSIS — R079 Chest pain, unspecified: Secondary | ICD-10-CM | POA: Diagnosis not present

## 2022-12-02 DIAGNOSIS — I209 Angina pectoris, unspecified: Secondary | ICD-10-CM | POA: Diagnosis not present

## 2022-12-02 MED ORDER — METOPROLOL TARTRATE 100 MG PO TABS: 100.0000 mg | ORAL_TABLET | Freq: Once | ORAL | 0 refills | Status: DC

## 2022-12-02 NOTE — Patient Instructions (Addendum)
Medication Instructions:  No changes  Hold the lisinopril, lasix and amlodipine the morning of the test Metoprolol tartrate 100 mg x 1 2 hours before the scan  If you need a refill on your cardiac medications before your next appointment, please call your pharmacy.   Lab work: No new labs needed  Testing/Procedures:   Your cardiac CT will be scheduled at one of the below locations:    [facility] [address] [place], [place] [address] [phone]  OR   [facility] [address] [place], [place] [address] [phone]   If scheduled at [facility] or [facility], please arrive 15 mins early for check-in and test prep.  There is spacious parking and easy access to the radiology department from the [facility] entrance. Please enter here and check-in with the desk attendant.   Please follow these instructions carefully (unless otherwise directed):  An IV will be required for this test and Nitroglycerin will be given.  Hold all erectile dysfunction medications at least 3 days (72 hrs) prior to test. (Ie viagra, cialis, sildenafil, tadalafil, etc)   On the Night Before the Test: Be sure to Drink plenty of water. Do not consume any caffeinated/decaffeinated beverages or chocolate 12 hours prior to your test. Do not take any antihistamines 12 hours prior to your test.  On the Day of the Test: Drink plenty of water until 1 hour prior to the test. Do not eat any food 1 hour prior to test. You may take your regular medications prior to the test except Lisinopril, Amlodipine and Lasix. Take metoprolol tartrate 100 mg (Lopressor) two hours prior to test. If you take Furosemide/Hydrochlorothiazide/Spironolactone, please HOLD on the morning of the test.      After the Test: Drink plenty of water. After receiving IV contrast, you may experience a  mild flushed feeling. This is normal. On occasion, you may experience a mild rash up to 24 hours after the test. This is not dangerous. If this occurs, you can take Benadryl 25 mg and increase your fluid intake. If you experience trouble breathing, this can be serious. If it is severe call 911 IMMEDIATELY. If it is mild, please call our office. If you take any of these medications: Glipizide/Metformin, Avandament, Glucavance, please do not take 48 hours after completing test unless otherwise instructed.  We will call to schedule your test 2-4 weeks out understanding that some insurance companies will need an authorization prior to the service being performed.   For more information and frequently asked questions, please visit our website : [email or web address]  For non-scheduling related questions, please contact the cardiac imaging nurse navigator should you have any questions/concerns: Cardiac Imaging Nurse Navigators Direct Office Dial: [phone]   For scheduling needs, including cancellations and rescheduling, please call [place], [phone].    Follow-Up: At [facility], you and your health needs are our priority.  As part of our continuing mission to provide you with exceptional heart care, we have created designated Provider Care Teams.  These Care Teams include your primary Cardiologist (physician) and Advanced Practice Providers (APPs -  Physician Assistants and Nurse Practitioners) who all work together to provide you with the care you need, when you need it.  You will need a follow up appointment in 12 months  Providers on your designated Care Team:   Nicolasa Ducking, NP Eula Listen, PA-C Cadence Fransico Michael, [place]  COVID-19 Vaccine Information can  be found at: [email or web address] For questions related to vaccine distribution or appointments, please email [email or web address] or call [phone].

## 2022-12-02 NOTE — Progress Notes (Signed)
Cardiology Office Note  Date:  12/02/2022   ID:  James Camacho, DOB Jul 16, 1950, MRN [id number]  PCP:  James Schwalbe, MD   Chief Complaint  Patient presents with   James Camacho up [facility] 11/28/22 ER; chest pain     "Doing well." Medications reviewed by the patient verbally.     HPI:  James Camacho is a 72 y.o. male with a hx of  hyperlipidemia, on Lipitor hypertension,  OSA on CPAP,  diabetes  anxiety  who presents for add-on visit for chest pain Seen January 2022 for chest pain by Dr. Azucena Cecil  Last seen by cardiology in 2022 On visit in 2022, chest pain in the setting of vomiting and diarrhea Echocardiogram at that time with normal ejection fraction  Seen in the emergency room November 28, 2018 for  for chest pain symptoms, near syncope while working at his desk with diaphoresis lasting 45 minutes to 1 hour some associated shortness of breath, some nausea but no vomiting Evaluated by EMS mildly hypotensive was given IV fluids with improvement of pressure ruled out CTA scan chest with no acute findings, scant coronary calcium, mild aortic atherosclerosis  Concerned about his recent symptoms of chest discomfort  No significant smoking history, he does have history of diabetes On medication for hyperlipidemia  EKG personally reviewed by myself on todays visit EKG Interpretation Date/Time:  Friday December 02 2022 11:29:55 EDT Ventricular Rate:  71 PR Interval:  138 QRS Duration:  96 QT Interval:  378 QTC Calculation: 410 R Axis:   11  Text Interpretation: Normal sinus rhythm Normal ECG When compared with ECG of 28-Nov-2022 13:59, No significant change was found Confirmed by Julien Nordmann [phone]) on 12/02/2022 11:47:28 AM     PMH:   has a past medical history of Anal condylomata, Anxiety, Depression, ED (erectile dysfunction), History of adenomatous polyp of colon, History of exercise stress test, Hyperlipidemia, Hypertension, OSA (obstructive sleep apnea), OSA  on CPAP, Seasonal allergic rhinitis, Type 2 diabetes mellitus (HCC), Wears glasses, and Wears hearing aid in both ears.  PSH:    Past Surgical History:  Procedure Laterality Date   BREAST DUCTAL SYSTEM EXCISION Left 11/26/2021   Procedure: EXCISION DUCTAL SYSTEM BREAST;  Surgeon: Earline Mayotte, MD;  Location: [facility] ORS;  Service: General;  Laterality: Left;   CATARACT EXTRACTION W/PHACO Right 06/28/2017   Procedure: CATARACT EXTRACTION PHACO AND INTRAOCULAR LENS PLACEMENT (IOC) RIGHT;  Surgeon: Lockie Mola, MD;  Location: [facility];  Service: Ophthalmology;  Laterality: Right;   CATARACT EXTRACTION W/PHACO Left 07/19/2017   Procedure: CATARACT EXTRACTION PHACO AND INTRAOCULAR LENS PLACEMENT (IOC) LEFT TOPICAL;  Surgeon: Lockie Mola, MD;  Location: [facility];  Service: Ophthalmology;  Laterality: Left;  IVA TOPICALLEFTDIABETIC   CHOLECYSTECTOMY OPEN  1980s   dr  Okey Dupre   AND APPENDECTOMY   COLONOSCOPY WITH PROPOFOL  last one 03-28-2018   dr pyrtle   Corneal replacements Bilateral    12/23 and 2/24   LASER ABLATION CONDOLAMATA N/A 12/20/2018   Procedure: LASER ABLATION OF CONDOLAMATA, EXAMINATION UNDER ANESTHESIA;  Surgeon: James Soda, MD;  Location: [facility];  Service: General;  Laterality: N/A;    Current Outpatient Medications  Medication Sig Dispense Refill   amLODipine (NORVASC) 10 MG tablet TAKE 1 TABLET BY MOUTH EVERY DAY. 90 tablet 3   aspirin 81 MG chewable tablet Chew by mouth.     ASPIRIN 81 PO Take by mouth.     atorvastatin (LIPITOR) 10 MG tablet TAKE 1  TABLET BY MOUTH EVERY DAY. 90 tablet 3   carvedilol (COREG) 12.5 MG tablet TAKE 1 TABLET BY MOUTH TWICE A DAY WITH MEALS. 180 tablet 3   citalopram (CELEXA) 40 MG tablet Take 0.5 tablets (20 mg total) by mouth daily. 1 tablet 0   Continuous Blood Gluc Sensor (FREESTYLE LIBRE 2 SENSOR) MISC 1 each by Does not apply route every 14 (fourteen) days. 2 each 11    dapagliflozin propanediol (FARXIGA) 5 MG TABS tablet TAKE ONE TABLET EVERY DAY 90 tablet 3   DULoxetine (CYMBALTA) 30 MG capsule Take 1 capsule (30 mg total) by mouth daily. 30 capsule 3   fluticasone (FLONASE) 50 MCG/ACT nasal spray USE 2 SPRAYS IN EACH NOSTRIL ONCE DAILY AS DIRECTED 48 g 3   furosemide (LASIX) 40 MG tablet TAKE 1 TABLET BY MOUTH EVERY DAY. 90 tablet 3   Insulin Pen Needle (PEN NEEDLES) 32G X 6 MM MISC 1 each by Does not apply route daily. Use with Victoza once a day 50 each 11   lisinopril (ZESTRIL) 40 MG tablet TAKE ONE TABLET EVERY DAY 90 tablet 3   omeprazole (PRILOSEC) 20 MG capsule TAKE 1 CAPSULE BY MOUTH ONCE DAILY 90 capsule 3   Semaglutide, 2 MG/DOSE, (OZEMPIC, 2 MG/DOSE,) 8 MG/3ML SOPN INJECT 2MG  AS DIRECTED ONCE PER WEEK 3 mL 11   tadalafil (CIALIS) 20 MG tablet TAKE ONE-HALF TO ONE TABLET BY MOUTH EVERY OTHER DAY AS NEEDED FOR ERECTILE DYSFUNCTION 10 tablet 11   No current facility-administered medications for this visit.     Allergies:   Aleve [naproxen sodium], Shrimp (diagnostic), Shrimp extract, Other, Penicillin g, and Penicillins   Social History:  The patient  reports that he has never smoked. He has never been exposed to tobacco smoke. He has never used smokeless tobacco. He reports that he does not currently use alcohol. He reports that he does not use drugs.   Family History:   family history includes Arthritis in his mother; Chronic Renal Failure in his mother; Diabetes in his mother; Healthy in his brother, sister, and sister; Heart disease in his father; Hypertension in his mother; Parkinsonism in his father.    Review of Systems: Review of Systems  Constitutional: Negative.   HENT: Negative.    Respiratory: Negative.    Cardiovascular:  Positive for chest pain.  Gastrointestinal: Negative.   Musculoskeletal: Negative.   Neurological: Negative.   Psychiatric/Behavioral: Negative.    All other systems reviewed and are negative.    PHYSICAL  EXAM: VS:  BP 110/60 (BP Location: Left Arm, Patient Position: Sitting, Cuff Size: Normal)   Pulse 71   Ht 5\' 8"  (1.727 m)   Wt 238 lb 6 oz (108.1 kg)   SpO2 96%   BMI 36.24 kg/m  , BMI Body mass index is 36.24 kg/m. GEN: Well nourished, well developed, in no acute distress HEENT: normal Neck: no JVD, carotid bruits, or masses Cardiac: RRR; no murmurs, rubs, or gallops,no edema  Respiratory:  clear to auscultation bilaterally, normal work of breathing GI: soft, nontender, nondistended, + BS MS: no deformity or atrophy Skin: warm and dry, no rash Neuro:  Strength and sensation are intact Psych: euthymic mood, full affect   Recent Labs: 07/12/2022: ALT 38 11/28/2022: BUN 11; Creatinine, Ser 0.84; Hemoglobin 15.8; Platelets 178; Potassium 3.3; Sodium 139    Lipid Panel Lab Results  Component Value Date   CHOL 125 07/12/2022   HDL 40.70 07/12/2022   LDLCALC 64 07/12/2022   TRIG 105.0  07/12/2022      Wt Readings from Last 3 Encounters:  12/02/22 238 lb 6 oz (108.1 kg)  11/28/22 229 lb 15 oz (104.3 kg)  07/12/22 230 lb (104.3 kg)       ASSESSMENT AND PLAN:  Problem List Items Addressed This Visit       Cardiology Problems   Essential hypertension, benign   Relevant Orders   EKG 12-Lead (Completed)   Type 2 diabetes mellitus with other circulatory complications (HCC)     Other   OSA (obstructive sleep apnea)   Other Visit Diagnoses     Chest pain of uncertain etiology    -  Primary   Relevant Orders   EKG 12-Lead (Completed)      Chest pain/angina Recently seen in the emergency room for chest pain symptoms with diaphoresis, near syncope Cardiac enzymes negative, EKG nonacute Coronary calcification and aortic atherosclerosis noted on CT scan Discussed options for management including Myoview, cardiac CTA, medical management Given severity of his symptoms, he would prefer additional evaluation We have ordered cardiac CTA to rule out underlying cardiac  ischemia  Hyperlipidemia Lipids well-controlled on atorvastatin 10 daily  Essential hypertension Blood pressure is well controlled on today's visit. No changes made to the medications.    Total encounter time more than 40 minutes  Greater than 50% was spent in counseling and coordination of care with the patient    Signed, Dossie Arbour, M.D., Ph.D. [facility] [place], [place] [phone]

## 2022-12-06 ENCOUNTER — Telehealth (HOSPITAL_COMMUNITY): Payer: Self-pay | Admitting: Emergency Medicine

## 2022-12-06 NOTE — Telephone Encounter (Signed)
Reaching out to patient to offer assistance regarding upcoming cardiac imaging study; pt verbalizes understanding of appt date/time, parking situation and where to check in, pre-test NPO status and medications ordered, and verified current allergies; name and call back number provided for further questions should they arise Cayne Yom RN Navigator Cardiac Imaging [facility] [phone] office [phone] cell 

## 2022-12-07 ENCOUNTER — Ambulatory Visit: Payer: PPO

## 2022-12-29 ENCOUNTER — Encounter: Payer: Self-pay | Admitting: Internal Medicine

## 2022-12-30 ENCOUNTER — Telehealth: Payer: Self-pay

## 2022-12-30 ENCOUNTER — Encounter: Payer: Self-pay | Admitting: Internal Medicine

## 2022-12-30 DIAGNOSIS — H903 Sensorineural hearing loss, bilateral: Secondary | ICD-10-CM | POA: Diagnosis not present

## 2022-12-30 MED ORDER — TIRZEPATIDE 10 MG/0.5ML ~~LOC~~ SOAJ: 10.0000 mg | SUBCUTANEOUS | 1 refills | Status: DC

## 2022-12-30 NOTE — Telephone Encounter (Signed)
Transition Care Management Unsuccessful Follow-up Telephone Call  Date of discharge and from where:  11/28/2022 [facility]  Attempts:  1st Attempt  Reason for unsuccessful TCM follow-up call:  No answer/busy  Rashanda Magloire [facility]  [facility], [facility] Guide Direct Dial: [phone]  Website: [email or web address]

## 2022-12-30 NOTE — Telephone Encounter (Signed)
Transition Care Management Follow-up Telephone Call Date of discharge and from where: 11/28/2022 [facility] How have you been since you were released from the hospital? Patient stated he is feeling much better. Any questions or concerns? No  Items Reviewed: Did the pt receive and understand the discharge instructions provided? Yes  Medications obtained and verified?  No medication prescribed. Other? No  Any new allergies since your discharge? No  Dietary orders reviewed? Yes Do you have support at home? Yes   Follow up appointments reviewed:  PCP Hospital f/u appt confirmed? Yes  Scheduled to see Karie Schwalbe, MD on 01/12/2023 @ [facility] Specialist Hospital f/u appt confirmed? Yes  Scheduled to see Antonieta Iba, MD on 12/02/2022 @  [facility]. Are transportation arrangements needed? No  If their condition worsens, is the pt aware to call PCP or go to the Emergency Dept.? Yes Was the patient provided with contact information for the PCP's office or ED? Yes Was to pt encouraged to call back with questions or concerns? Yes   Timiya Howells [facility]  [facility], [facility] Guide Direct Dial: [phone]  Website: [email or web address]

## 2023-01-03 ENCOUNTER — Other Ambulatory Visit (HOSPITAL_COMMUNITY): Payer: Self-pay

## 2023-01-03 ENCOUNTER — Telehealth: Payer: Self-pay

## 2023-01-03 NOTE — Telephone Encounter (Signed)
Pharmacy Patient Advocate Encounter   Received notification from Physician's Office that prior authorization for James Camacho is required/requested.   Insurance verification completed.   The patient is insured through [facility] ADVANTAGE/RX ADVANCE .   Per test claim: PA required; PA submitted to [facility] ADVANTAGE/RX ADVANCE via CoverMyMeds Key/confirmation #/EOC Key: [id number] Status is pending

## 2023-01-05 ENCOUNTER — Other Ambulatory Visit (HOSPITAL_COMMUNITY): Payer: Self-pay

## 2023-01-05 NOTE — Telephone Encounter (Signed)
Pharmacy Patient Advocate Encounter  Received notification from [facility] ADVANTAGE/RX ADVANCE that Prior Authorization for James Camacho has been APPROVED from 10.29.24 to 10.29.25. Ran test claim, Copay is $235.57. This test claim was processed through [facility]- copay amounts may vary at other pharmacies due to pharmacy/plan contracts, or as the patient moves through the different stages of their insurance plan.   PA #/Case ID/Reference #: Key: [id number]

## 2023-01-11 DIAGNOSIS — E119 Type 2 diabetes mellitus without complications: Secondary | ICD-10-CM | POA: Diagnosis not present

## 2023-01-11 DIAGNOSIS — H35351 Cystoid macular degeneration, right eye: Secondary | ICD-10-CM | POA: Diagnosis not present

## 2023-01-11 LAB — HM DIABETES EYE EXAM

## 2023-01-12 ENCOUNTER — Encounter: Payer: Self-pay | Admitting: Internal Medicine

## 2023-01-12 ENCOUNTER — Ambulatory Visit: Payer: PPO | Admitting: Internal Medicine

## 2023-01-12 VITALS — BP 132/80 | HR 79 | Temp 97.8°F | Ht 68.0 in | Wt 243.0 lb

## 2023-01-12 DIAGNOSIS — E1159 Type 2 diabetes mellitus with other circulatory complications: Secondary | ICD-10-CM | POA: Diagnosis not present

## 2023-01-12 DIAGNOSIS — Z7985 Long-term (current) use of injectable non-insulin antidiabetic drugs: Secondary | ICD-10-CM | POA: Diagnosis not present

## 2023-01-12 DIAGNOSIS — I10 Essential (primary) hypertension: Secondary | ICD-10-CM

## 2023-01-12 DIAGNOSIS — Z7984 Long term (current) use of oral hypoglycemic drugs: Secondary | ICD-10-CM

## 2023-01-12 LAB — POCT GLYCOSYLATED HEMOGLOBIN (HGB A1C): Hemoglobin A1C: 6.2 % — AB (ref 4.0–5.6)

## 2023-01-12 NOTE — Assessment & Plan Note (Signed)
BP Readings from Last 3 Encounters:  01/12/23 132/80  12/02/22 110/60  11/28/22 110/68   On amlodipine, lisinopril 40 and metoprolol 100

## 2023-01-12 NOTE — Addendum Note (Signed)
Addended by: Eual Fines on: 01/12/2023 04:21 PM   Modules accepted: Orders

## 2023-01-12 NOTE — Assessment & Plan Note (Addendum)
A1c stable at 6.2%  Is on ozempic pending approval to switch to [facility] (if gets, will use 10mg  for 1 month, then go up to 15mg ) [place] 5

## 2023-01-12 NOTE — Progress Notes (Signed)
Subjective:    Patient ID: James Camacho, male    DOB: October 07, 1950, 72 y.o.   MRN: [id number]  HPI Here for follow up of diabetes and other chronic health conditions  Still on ozempic--waiting to get approval for the mounjaro Thinks he eats right 50% of the time BMI ~37 now  Sugars run 130-175  No chest pain No SOB  Had some eye changes--went back to cornea doctor Told dry eye Went back to [employer] (Dr Babette Relic antiinflammatories and may need injectoin  Current Outpatient Medications on File Prior to Visit  Medication Sig Dispense Refill   ACULAR 0.5 % ophthalmic solution Place 1 drop into the right eye 4 (four) times daily.     amLODipine (NORVASC) 10 MG tablet TAKE 1 TABLET BY MOUTH EVERY DAY. 90 tablet 3   aspirin 81 MG chewable tablet Chew by mouth.     ASPIRIN 81 PO Take by mouth.     atorvastatin (LIPITOR) 10 MG tablet TAKE 1 TABLET BY MOUTH EVERY DAY. 90 tablet 3   carvedilol (COREG) 12.5 MG tablet TAKE 1 TABLET BY MOUTH TWICE A DAY WITH MEALS. 180 tablet 3   citalopram (CELEXA) 40 MG tablet Take 0.5 tablets (20 mg total) by mouth daily. 1 tablet 0   dapagliflozin propanediol (FARXIGA) 5 MG TABS tablet TAKE ONE TABLET EVERY DAY 90 tablet 3   DULoxetine (CYMBALTA) 30 MG capsule Take 1 capsule (30 mg total) by mouth daily. 30 capsule 3   fluticasone (FLONASE) 50 MCG/ACT nasal spray USE 2 SPRAYS IN EACH NOSTRIL ONCE DAILY AS DIRECTED 48 g 3   furosemide (LASIX) 40 MG tablet TAKE 1 TABLET BY MOUTH EVERY DAY. 90 tablet 3   Insulin Pen Needle (PEN NEEDLES) 32G X 6 MM MISC 1 each by Does not apply route daily. Use with Victoza once a day 50 each 11   lisinopril (ZESTRIL) 40 MG tablet TAKE ONE TABLET EVERY DAY 90 tablet 3   omeprazole (PRILOSEC) 20 MG capsule TAKE 1 CAPSULE BY MOUTH ONCE DAILY 90 capsule 3   prednisoLONE acetate (PRED FORTE) 1 % ophthalmic suspension Place 1 drop into the right eye every 4 (four) hours.     tadalafil (CIALIS) 20 MG tablet TAKE  ONE-HALF TO ONE TABLET BY MOUTH EVERY OTHER DAY AS NEEDED FOR ERECTILE DYSFUNCTION 10 tablet 11   tirzepatide (MOUNJARO) 10 MG/0.5ML Pen Inject 10 mg into the skin once a week. 2 mL 1   metoprolol tartrate (LOPRESSOR) 100 MG tablet Take 1 tablet (100 mg total) by mouth once for 1 dose. 1 tablet 0   No current facility-administered medications on file prior to visit.    Allergies  Allergen Reactions   Aleve [Naproxen Sodium] Hives   Shrimp (Diagnostic) Other (See Comments)    Itching and dry mouth   Shrimp Extract Other (See Comments)    Itching and dry mouth   Other     Pt can only take Tylenol for pain- anything prescribed that he has tried breaks him out in a rash.    Penicillin G Rash   Penicillins Rash    Past Medical History:  Diagnosis Date   Anal condylomata    Anxiety    Depression    ED (erectile dysfunction)    History of adenomatous polyp of colon    History of exercise stress test    ETT 04-17-2007 (in epic)  negative   Hyperlipidemia    LDL in 120's   Hypertension  followed by pcp   OSA (obstructive sleep apnea)    OSA on CPAP    study in epic 08-11-2017 moderate osa   Seasonal allergic rhinitis    Type 2 diabetes mellitus (HCC)    followed by pcp   Wears glasses    Wears hearing aid in both ears     Past Surgical History:  Procedure Laterality Date   BREAST DUCTAL SYSTEM EXCISION Left 11/26/2021   Procedure: EXCISION DUCTAL SYSTEM BREAST;  Surgeon: Earline Mayotte, MD;  Location: [facility];  Service: General;  Laterality: Left;   CATARACT EXTRACTION W/PHACO Right 06/28/2017   Procedure: CATARACT EXTRACTION PHACO AND INTRAOCULAR LENS PLACEMENT (IOC) RIGHT;  Surgeon: Lockie Mola, MD;  Location: [facility];  Service: Ophthalmology;  Laterality: Right;   CATARACT EXTRACTION W/PHACO Left 07/19/2017   Procedure: CATARACT EXTRACTION PHACO AND INTRAOCULAR LENS PLACEMENT (IOC) LEFT TOPICAL;  Surgeon: Lockie Mola, MD;  Location:  [facility];  Service: Ophthalmology;  Laterality: Left;  IVA TOPICALLEFTDIABETIC   CHOLECYSTECTOMY OPEN  1980s   dr  Okey Dupre   AND APPENDECTOMY   COLONOSCOPY WITH PROPOFOL  last one 03-28-2018   dr pyrtle   Corneal replacements Bilateral    12/23 and 2/24   LASER ABLATION CONDOLAMATA N/A 12/20/2018   Procedure: LASER ABLATION OF CONDOLAMATA, EXAMINATION UNDER ANESTHESIA;  Surgeon: Karie Soda, MD;  Location: [facility];  Service: General;  Laterality: N/A;    Family History  Problem Relation Age of Onset   Diabetes Mother    Arthritis Mother    Hypertension Mother    Chronic Renal Failure Mother    Parkinsonism Father    Heart disease Father    Healthy Sister    Healthy Sister    Healthy Brother    Cancer Neg Hx        no colon or prostate cancer   Colon cancer Neg Hx    Esophageal cancer Neg Hx    Stomach cancer Neg Hx    Rectal cancer Neg Hx    Breast cancer Neg Hx     Social History   Socioeconomic History   Marital status: Married    Spouse name: Not on file   Number of children: 3   Years of education: Not on file   Highest education level: Associate degree: academic program  Occupational History   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 [place]    Comment: Sold and retired   Occupation: Futures trader business    Comment:    Tobacco Use   Smoking status: Never    Passive exposure: Never   Smokeless tobacco: Never  Vaping Use   Vaping status: Never Used  Substance and Sexual Activity   Alcohol use: Not Currently    Comment: rare   Drug use: Never   Sexual activity: Not on file  Other Topics Concern   Not on file  Social History Narrative   Has living will   2 daughters should now be his health care POA   Would accept resuscitation   No prolonged tube feeds   Social Determinants of Health   Financial Resource Strain: Low Risk  (05/30/2022)   Overall Financial Resource Strain (CARDIA)    Difficulty of  Paying Living Expenses: Not hard at all  Food Insecurity: No Food Insecurity (05/30/2022)   Hunger Vital Sign    Worried About Running Out of Food in the Last Year: Never true    Ran Out of Food in  the Last Year: Never true  Transportation Needs: No Transportation Needs (05/30/2022)   PRAPARE - Administrator, Civil Service (Medical): No    Lack of Transportation (Non-Medical): No  Physical Activity: Insufficiently Active (05/30/2022)   Exercise Vital Sign    Days of Exercise per Week: 4 days    Minutes of Exercise per Session: 30 min  Stress: Stress Concern Present (05/30/2022)   [employer] of Occupational Health - Occupational Stress Questionnaire    Feeling of Stress : To some extent  Social Connections: Socially Integrated (05/30/2022)   Social Connection and Isolation Panel [NHANES]    Frequency of Communication with Friends and Family: More than three times a week    Frequency of Social Gatherings with Friends and Family: Twice a week    Attends Religious Services: More than 4 times per year    Active Member of [employer] or Organizations: Yes    Attends Engineer, structural: More than 4 times per year    Marital Status: Married  Catering manager Violence: Not on file   Review of Systems Weight is up Sleeps okay in general     Objective:   Physical Exam Constitutional:      Appearance: He is obese.  Cardiovascular:     Rate and Rhythm: Normal rate and regular rhythm.     Pulses: Normal pulses.     Heart sounds: No murmur heard.    No gallop.  Pulmonary:     Effort: Pulmonary effort is normal.     Breath sounds: Normal breath sounds. No wheezing or rales.  Musculoskeletal:     Cervical back: Neck supple.     Right lower leg: No edema.     Left lower leg: No edema.  Lymphadenopathy:     Cervical: No cervical adenopathy.  Skin:    Comments: No foot lesions  Neurological:     Mental Status: He is alert.  Psychiatric:        Mood and Affect:  Mood normal.        Behavior: Behavior normal.            Assessment & Plan:

## 2023-01-12 NOTE — Assessment & Plan Note (Signed)
Is on ozempic but gained weight BMI 37 with DM, HTN, GERD ,etc Trying to switch to mounjaro Needs to tighten up on eating

## 2023-01-14 ENCOUNTER — Encounter: Payer: Self-pay | Admitting: Internal Medicine

## 2023-01-14 ENCOUNTER — Other Ambulatory Visit: Payer: Self-pay | Admitting: Internal Medicine

## 2023-01-16 MED ORDER — TIRZEPATIDE 5 MG/0.5ML ~~LOC~~ SOAJ: 5.0000 mg | SUBCUTANEOUS | 0 refills | Status: DC

## 2023-01-16 NOTE — Telephone Encounter (Signed)
  Pharmacy comment: Script Clarification:PT HAS NOT STARTED ON MOUNJARO. WAS ON 2 MG OZEMPIC.  STILL SINCE PT HAS NOT USED MOUNJARO BEFORE. PLEASE START /SEND IN RX WITH A LOWER DOSAGE TO SEE HOW PT REACTS. THANKS.

## 2023-01-17 ENCOUNTER — Encounter: Payer: Self-pay | Admitting: Internal Medicine

## 2023-01-21 ENCOUNTER — Emergency Department: Payer: PPO

## 2023-01-21 ENCOUNTER — Emergency Department
Admission: EM | Admit: 2023-01-21 | Discharge: 2023-01-22 | Disposition: A | Discharge status: Home / Self Care | Payer: PPO | Attending: Emergency Medicine | Admitting: Emergency Medicine

## 2023-01-21 ENCOUNTER — Other Ambulatory Visit: Payer: Self-pay

## 2023-01-21 DIAGNOSIS — S42213A Unspecified displaced fracture of surgical neck of unspecified humerus, initial encounter for closed fracture: Secondary | ICD-10-CM

## 2023-01-21 DIAGNOSIS — W01198A Fall on same level from slipping, tripping and stumbling with subsequent striking against other object, initial encounter: Secondary | ICD-10-CM | POA: Diagnosis not present

## 2023-01-21 DIAGNOSIS — I10 Essential (primary) hypertension: Secondary | ICD-10-CM | POA: Diagnosis not present

## 2023-01-21 DIAGNOSIS — S42202A Unspecified fracture of upper end of left humerus, initial encounter for closed fracture: Secondary | ICD-10-CM

## 2023-01-21 DIAGNOSIS — W19XXXA Unspecified fall, initial encounter: Secondary | ICD-10-CM | POA: Diagnosis not present

## 2023-01-21 DIAGNOSIS — S42292A Other displaced fracture of upper end of left humerus, initial encounter for closed fracture: Secondary | ICD-10-CM | POA: Diagnosis not present

## 2023-01-21 DIAGNOSIS — Y92003 Bedroom of unspecified non-institutional (private) residence as the place of occurrence of the external cause: Secondary | ICD-10-CM | POA: Diagnosis not present

## 2023-01-21 DIAGNOSIS — S4992XA Unspecified injury of left shoulder and upper arm, initial encounter: Secondary | ICD-10-CM | POA: Diagnosis present

## 2023-01-21 DIAGNOSIS — M898X8 Other specified disorders of bone, other site: Secondary | ICD-10-CM | POA: Diagnosis not present

## 2023-01-21 DIAGNOSIS — M85812 Other specified disorders of bone density and structure, left shoulder: Secondary | ICD-10-CM | POA: Diagnosis not present

## 2023-01-21 DIAGNOSIS — S42212A Unspecified displaced fracture of surgical neck of left humerus, initial encounter for closed fracture: Secondary | ICD-10-CM | POA: Diagnosis not present

## 2023-01-21 DIAGNOSIS — M25519 Pain in unspecified shoulder: Secondary | ICD-10-CM | POA: Diagnosis not present

## 2023-01-21 DIAGNOSIS — M19012 Primary osteoarthritis, left shoulder: Secondary | ICD-10-CM | POA: Diagnosis not present

## 2023-01-21 HISTORY — DX: Unspecified displaced fracture of surgical neck of unspecified humerus, initial encounter for closed fracture: S42.213A

## 2023-01-21 MED ORDER — FENTANYL CITRATE PF 50 MCG/ML IJ SOSY
50.0000 ug | PREFILLED_SYRINGE | Freq: Once | INTRAMUSCULAR | Status: AC
  Administered 2023-01-21: 50 ug via INTRAVENOUS
  Filled 2023-01-21: qty 1

## 2023-01-21 MED ORDER — OXYCODONE HCL 5 MG PO TABS
5.0000 mg | ORAL_TABLET | Freq: Three times a day (TID) | ORAL | 0 refills | Status: DC | PRN
Start: 2023-01-21 — End: 2023-01-22

## 2023-01-21 MED ORDER — HYDROCODONE-ACETAMINOPHEN 5-325 MG PO TABS
2.0000 | ORAL_TABLET | Freq: Once | ORAL | Status: AC
  Administered 2023-01-21: 2 via ORAL
  Filled 2023-01-21: qty 2

## 2023-01-21 NOTE — ED Notes (Signed)
Patient to ED via wheelchair by EMS from home.  Per EMS patient lost his balance and fell in bedroom landing on left shoulder and also hit his head (denies loc or taking blood thinners).  Patient reports pain to left shoulder, numbness/tingling to l hand since fall.  Patient has saline loc to left antecub via 20g angiocath, patient received Fentanyl via IV by EMS.

## 2023-01-21 NOTE — Discharge Instructions (Addendum)
Call Dr [name] for an appointment for your fractured humerus bone.   Keep in sling.   Take acetaminophen 650 mg and ibuprofen 400 mg every 6 hours for pain.  Take with food. Oxycodone for severe pain only.   Thank you for choosing [place] for your health care today!  Please see your primary doctor this week for a follow up appointment.   If you have any new, worsening, or unexpected symptoms call your doctor right away or come back to the emergency department for reevaluation.  It was my pleasure to care for you today.   Daneil Dan Modesto Charon, MD

## 2023-01-21 NOTE — ED Triage Notes (Signed)
Pt tripped and fell today injuring left shoulder. Pt is AOX4, no LOC, no blood thinners. Arm in sling from EMS.

## 2023-01-22 MED ORDER — OXYCODONE HCL 5 MG PO TABS
5.0000 mg | ORAL_TABLET | Freq: Once | ORAL | Status: AC
  Administered 2023-01-22: 5 mg via ORAL
  Filled 2023-01-22: qty 1

## 2023-01-22 MED ORDER — OXYCODONE HCL 5 MG PO TABS
5.0000 mg | ORAL_TABLET | Freq: Three times a day (TID) | ORAL | 0 refills | Status: DC | PRN
Start: 2023-01-22 — End: 2023-01-28

## 2023-01-22 MED ORDER — KETOROLAC TROMETHAMINE 15 MG/ML IJ SOLN
30.0000 mg | Freq: Once | INTRAMUSCULAR | Status: DC
  Filled 2023-01-22: qty 2

## 2023-01-22 MED ORDER — HYDROMORPHONE HCL 1 MG/ML IJ SOLN
0.5000 mg | Freq: Once | INTRAMUSCULAR | Status: AC
  Administered 2023-01-22: 0.5 mg via INTRAVENOUS
  Filled 2023-01-22: qty 0.5

## 2023-01-22 MED ORDER — MORPHINE SULFATE (PF) 4 MG/ML IV SOLN
6.0000 mg | Freq: Once | INTRAVENOUS | Status: AC
  Administered 2023-01-22: 6 mg via INTRAVENOUS
  Filled 2023-01-22: qty 2

## 2023-01-22 MED ORDER — ONDANSETRON 4 MG PO TBDP: 4.0000 mg | ORAL_TABLET | Freq: Four times a day (QID) | ORAL | 0 refills | Status: DC | PRN

## 2023-01-22 NOTE — ED Provider Notes (Signed)
[facility] Provider Note    Event Date/Time   First MD Initiated Contact with Patient 01/21/23 2303     (approximate)   History   Fall   HPI {Remember to add pertinent medical, surgical, social, and/or OB history to HPI:1} James Camacho is a 72 y.o. male  ***       Physical Exam   Triage Vital Signs: ED Triage Vitals  Encounter Vitals Group     BP 01/21/23 1931 (!) 153/86     Systolic BP Percentile --      Diastolic BP Percentile --      Pulse Rate 01/21/23 1931 81     Resp 01/21/23 1931 17     Temp 01/21/23 1931 98.3 F (36.8 C)     Temp Source 01/21/23 1931 Oral     SpO2 01/21/23 1931 100 %     Weight --      Height --      Head Circumference --      Peak Flow --      Pain Score 01/21/23 1930 7     Pain Loc --      Pain Education --      Exclude from Growth Chart --     Most recent vital signs: Vitals:   01/21/23 1931 01/21/23 2318  BP: (!) 153/86 (!) 151/81  Pulse: 81 76  Resp: 17 17  Temp: 98.3 F (36.8 C) 98.2 F (36.8 C)  SpO2: 100% 99%    {Only need to document appropriate and relevant physical exam:1} General: Awake, no distress. *** CV:  Good peripheral perfusion. *** Resp:  Normal effort. *** Abd:  No distention. *** Other:  ***   ED Results / Procedures / Treatments   Labs (all labs ordered are listed, but only abnormal results are displayed) Labs Reviewed - No data to display   EKG  ***   RADIOLOGY *** {USE THE WORD "INTERPRETED"!! You MUST document your own interpretation of imaging, as well as the fact that you reviewed the radiologist's report!:1}   PROCEDURES:  Critical Care performed: {CriticalCareYesNo:19197::"Yes, see critical care procedure note(s)","No"}  Procedures   MEDICATIONS ORDERED IN ED: Medications  morphine (PF) 4 MG/ML injection 6 mg (has no administration in time range)  fentaNYL (SUBLIMAZE) injection 50 mcg (50 mcg Intravenous Given 01/21/23 2016)   HYDROcodone-acetaminophen (NORCO/VICODIN) 5-325 MG per tablet 2 tablet (2 tablets Oral Given 01/21/23 2317)     IMPRESSION / MDM / ASSESSMENT AND PLAN / ED COURSE  I reviewed the triage vital signs and the nursing notes.                              Differential diagnosis includes, but is not limited to, ***  Patient's presentation is most consistent with {EM COPA:27473}  *** {If the patient is on the monitor, remove the brackets and asterisks on the sentence below and remember to document it as a Procedure as well. Otherwise delete the sentence below:1} {**The patient is on the cardiac monitor to evaluate for evidence of arrhythmia and/or significant heart rate changes.**} {Remember to include, when applicable, any/all of the following data: independent review of imaging independent review of labs (comment specifically on pertinent positives and negatives) review of specific prior hospitalizations, PCP/specialist notes, etc. discuss meds given and prescribed document any discussion with consultants (including hospitalists) any clinical decision tools you used and why (PECARN, NEXUS, etc.) did you consider  admitting the patient? document social determinants of health affecting patient's care (homelessness, inability to follow up in a timely fashion, etc) document any pre-existing conditions increasing risk on current visit (e.g. diabetes and HTN increasing danger of high-risk chest pain/ACS) describes what meds you gave (especially parenteral) and why any other interventions?:1} Clinical Course as of 01/22/23 0003  Sat Jan 21, 2023  2257 Discussed and imaging reviewed by Dr. [name] of orthopedics.  He advises sling and follow-up in the office with himself or Dr. Joice Lofts.  He does advise that obtaining a CT scan would be helpful for further detail and follow-up purposes request we obtain CT of the shoulder here in the ER.  He does advise that even if the CT does show displacement of the  shoulder that patient's trajectory would be to follow-up in the outpatient setting with him and his office will call [MQ]    Clinical Course User Index [MQ] Sharyn Creamer, MD     FINAL CLINICAL IMPRESSION(S) / ED DIAGNOSES   Final diagnoses:  Closed fracture of proximal end of left humerus, unspecified fracture morphology, initial encounter     Rx / DC Orders   ED Discharge Orders          Ordered    oxyCODONE (ROXICODONE) 5 MG immediate release tablet  Every 8 hours PRN        01/21/23 2350             Note:  This document was prepared using Dragon voice recognition software and may include unintentional dictation errors.

## 2023-01-23 ENCOUNTER — Other Ambulatory Visit: Payer: Self-pay | Admitting: Orthopedic Surgery

## 2023-01-23 DIAGNOSIS — S42202A Unspecified fracture of upper end of left humerus, initial encounter for closed fracture: Secondary | ICD-10-CM | POA: Diagnosis not present

## 2023-01-24 ENCOUNTER — Telehealth: Payer: Self-pay | Admitting: *Deleted

## 2023-01-24 ENCOUNTER — Other Ambulatory Visit: Payer: Self-pay

## 2023-01-24 ENCOUNTER — Encounter
Admission: RE | Admit: 2023-01-24 | Discharge: 2023-01-24 | Disposition: A | Discharge status: Home / Self Care | Payer: PPO | Source: Ambulatory Visit | Attending: Orthopedic Surgery | Admitting: Orthopedic Surgery

## 2023-01-24 VITALS — Ht 69.0 in | Wt 240.0 lb

## 2023-01-24 DIAGNOSIS — E1159 Type 2 diabetes mellitus with other circulatory complications: Secondary | ICD-10-CM

## 2023-01-24 DIAGNOSIS — Z01812 Encounter for preprocedural laboratory examination: Secondary | ICD-10-CM

## 2023-01-24 NOTE — Telephone Encounter (Signed)
I sent a message to scheduling team in [place] if we may offer the appt today 3:20 with DR. Gollan DOD. I am not sure if pt will be able to make the appt in time as it is 2:55 already.   We will do all that we can to try and accommodate the pt for his surgery. I did update the surgery scheduler as well.

## 2023-01-24 NOTE — Telephone Encounter (Signed)
S/w the pt and he states his last dose of ASA was taken on Sunday 01/22/23. Advised pt per pre op APP Robin Searing, NP to not take any ASA until post surgery. Dr. [name] will advise when safe to resume. Pt verbalized understanding to plan of care.

## 2023-01-24 NOTE — Telephone Encounter (Signed)
   Name: Vansh Kubala  DOB: 1950-08-11  MRN: [id number]  Primary Cardiologist: Julien Nordmann, MD  Chart reviewed as part of pre-operative protocol coverage. Because of Mackenzie Valdiviez past medical history and time since last visit, he will require a follow-up in-office visit in order to better assess preoperative cardiovascular risk.  Mr. Whitmarsh was last seen on 12/02/2022 by Dr. Mariah Milling for complaint of chest pain.  During visit patient had coronary CTA ordered for further evaluation and to rule out ischemia.  He will need to be seen in person due to coronary CTA not being completed.  Patient is scheduled to undergo procedure with general anesthesia on 01/27/2023  Pre-op covering staff: - Please schedule appointment and call patient to inform them. If patient already had an upcoming appointment within acceptable timeframe, please add "pre-op clearance" to the appointment notes so provider is aware. - Please contact requesting surgeon's office via preferred method (i.e, phone, fax) to inform them of need for appointment prior to surgery. t.   Napoleon Form, Leodis Rains, NP  01/24/2023, 1:34 PM

## 2023-01-24 NOTE — Telephone Encounter (Signed)
I will update the surgeon's office the pt will need in office appt. See notes from Robin Searing, NP.

## 2023-01-24 NOTE — Telephone Encounter (Signed)
-----   Message from Verlee Monte sent at 01/24/2023  1:14 PM EST ----- Regarding: Request for pre-operative cardiac clearance Request for pre-operative cardiac clearance:  1. What type of surgery is being performed?  Left reverse shoulder arthroplasty, biceps tenodesis  2. When is this surgery scheduled?  01/27/2023  3. Type of clearance being requested (medical, pharmacy, both)? Medical   4. Are there any medications that need to be held prior to surgery? ASA  5. Practice name and name of physician performing surgery?  Performing surgeon: Dr. Signa Kell, MD Requesting clearance: Quentin Mulling, FNP-C    6. Anesthesia type (none, local, MAC, general)? GENERAL  7. What is the office phone and fax number?   Phone: [phone] Fax: [phone]  ATTENTION: Unable to create telephone message as per your standard workflow. Directed by [facility] providers to send requests for cardiac clearance to this pool for appropriate distribution to provider covering pre-operative clearances.   Quentin Mulling, MSN, APRN, FNP-C, CEN [facility]  Peri-operative Services Nurse Practitioner Phone: [phone] 01/24/23 1:14 PM

## 2023-01-24 NOTE — Telephone Encounter (Signed)
   Patient Name: James Camacho  DOB: 26-Dec-1950 MRN: [id number]  Primary Cardiologist: Julien Nordmann, MD  Chart reviewed as part of pre-operative protocol coverage. Given past medical history and time since last visit, based on ACC/AHA guidelines, James Camacho is at acceptable risk for the planned procedure without further cardiovascular testing.  Per Dr. Mariah Milling patient is cleared to proceed with scheduled shoulder surgery.  Per protocol patient can hold ASA 81 mg as requested and should restart postprocedure when surgically safe and hemostasis is achieved.  The patient was advised that if he develops new symptoms prior to surgery to contact our office to arrange for a follow-up visit, and he verbalized understanding.  I will route this recommendation to the requesting party via Epic fax function and remove from pre-op pool.  Please call with questions.  Napoleon Form, Leodis Rains, NP 01/24/2023, 3:38 PM

## 2023-01-24 NOTE — Patient Instructions (Addendum)
Your procedure is scheduled on: Friday 01/27/2023 Report to the Registration Desk on the 1st floor of the [facility]. To find out your arrival time, please call [phone] between 1PM - 3PM on: Thursday 01/26/2023  If your arrival time is 6:00 am, do not arrive before that time as the [facility] entrance doors do not open until 6:00 am.  REMEMBER: Instructions that are not followed completely may result in serious medical risk, up to and including death; or upon the discretion of your surgeon and anesthesiologist your surgery may need to be rescheduled.  Do not eat food after midnight the night before surgery.  No gum chewing or hard candies.  You may however, drink CLEAR liquids up to 2 hours before you are scheduled to arrive for your surgery. Do not drink anything within 2 hours of your scheduled arrival time.  Clear liquids include: - water    In addition, your doctor has ordered for you to drink the provided:  Gatorade G2 Drinking this carbohydrate drink up to two hours before surgery helps to reduce insulin resistance and improve patient outcomes. Please complete drinking 2 hours before scheduled arrival time.  One week prior to surgery: Stop Anti-inflammatories (NSAIDS) such as Advil, Aleve, Ibuprofen, Motrin, Naproxen, Naprosyn and Aspirin based products such as Excedrin, Goody's Powder, BC Powder. Stop ANY OVER THE COUNTER supplements until after surgery.  You may however, continue to take Tylenol if needed for pain up until the day of surgery.  Stop dapagliflozin propanediol (FARXIGA) 5 MG 3 days prior to surgery (take last dose 01/23/23) Stop  tirzepatide [facility]) 10 MG/0.5ML 1 week prior to surgery  Stop tadalafil (CIALIS) 20 MG today do not use before your procedure.   Continue taking all of your other prescription medications up until the day of surgery.  ON THE DAY OF SURGERY ONLY TAKE THESE MEDICATIONS WITH SIPS OF WATER:  amLODipine (NORVASC) 10 MG   atorvastatin (LIPITOR) 10 MG  carvedilol (COREG) 12.5 MG  citalopram (CELEXA) 40 MG  metoprolol tartrate (LOPRESSOR) 100 MG  omeprazole (PRILOSEC) 20 MG   No Alcohol for 24 hours before or after surgery.  No Smoking including e-cigarettes for 24 hours before surgery.  No chewable tobacco products for at least 6 hours before surgery.  No nicotine patches on the day of surgery.  Do not use any "recreational" drugs for at least a week (preferably 2 weeks) before your surgery.  Please be advised that the combination of cocaine and anesthesia may have negative outcomes, up to and including death. If you test positive for cocaine, your surgery will be cancelled.  On the morning of surgery brush your teeth with toothpaste and water, you may rinse your mouth with mouthwash if you wish. Do not swallow any toothpaste or mouthwash.  Use CHG Soap or wipes as directed on instruction sheet.  Do not wear jewelry, make-up, hairpins, clips or nail polish.  For welded (permanent) jewelry: bracelets, anklets, waist bands, etc.  Please have this removed prior to surgery.  If it is not removed, there is a chance that hospital personnel will need to cut it off on the day of surgery.  Do not wear lotions, powders, or perfumes.   Do not shave body hair from the neck down 48 hours before surgery.  Contact lenses, hearing aids and dentures may not be worn into surgery.  Do not bring valuables to the hospital. [facility] is not responsible for any missing/lost belongings or valuables.   Total  Shoulder Arthroplasty:  use Benzoyl Peroxide 5% Gel as directed on instruction sheet.  Bring your C-PAP to the hospital in case you may have to spend the night.   Notify your doctor if there is any change in your medical condition (cold, fever, infection).  Wear comfortable clothing (specific to your surgery type) to the hospital.  After surgery, you can help prevent lung complications by doing breathing  exercises.  Take deep breaths and cough every 1-2 hours. Your doctor may order a device called an Incentive Spirometer to help you take deep breaths. When coughing or sneezing, hold a pillow firmly against your incision with both hands. This is called "splinting." Doing this helps protect your incision. It also decreases belly discomfort.  If you are being admitted to the hospital overnight, leave your suitcase in the car. After surgery it may be brought to your room.  In case of increased patient census, it may be necessary for you, the patient, to continue your postoperative care in the Same Day Surgery department.  If you are being discharged the day of surgery, you will not be allowed to drive home. You will need a responsible individual to drive you home and stay with you for 24 hours after surgery.   If you are taking public transportation, you will need to have a responsible individual with you.  Please call the Pre-admissions Testing Dept. at [phone] if you have any questions about these instructions.  Surgery Visitation Policy:  Patients having surgery or a procedure may have two visitors.  Children under the age of 91 must have an adult with them who is not the patient.  Inpatient Visitation:    Visiting hours are 7 a.m. to 8 p.m. Up to four visitors are allowed at one time in a patient room. The visitors may rotate out with other people during the day.  One visitor age 26 or older may stay with the patient overnight and must be in the room by 8 p.m.    Pre-operative 5 CHG Bath Instructions   You can play a key role in reducing the risk of infection after surgery. Your skin needs to be as free of germs as possible. You can reduce the number of germs on your skin by washing with CHG (chlorhexidine gluconate) soap before surgery. CHG is an antiseptic soap that kills germs and continues to kill germs even after washing.   DO NOT use if you have an allergy to  chlorhexidine/CHG or antibacterial soaps. If your skin becomes reddened or irritated, stop using the CHG and notify one of our RNs at [phone].   Please shower with the CHG soap starting 4 days before surgery using the following schedule:     Please keep in mind the following:  DO NOT shave, including legs and underarms, starting the day of your first shower.   You may shave your face at any point before/day of surgery.  Place clean sheets on your bed the day you start using CHG soap. Use a clean washcloth (not used since being washed) for each shower. DO NOT sleep with pets once you start using the CHG.   CHG Shower Instructions:  If you choose to wash your hair and private area, wash first with your normal shampoo/soap.  After you use shampoo/soap, rinse your hair and body thoroughly to remove shampoo/soap residue.  Turn the water OFF and apply about 3 tablespoons (45 ml) of CHG soap to a CLEAN washcloth.  Apply CHG soap  ONLY FROM YOUR NECK DOWN TO YOUR TOES (washing for 3-5 minutes)  DO NOT use CHG soap on face, private areas, open wounds, or sores.  Pay special attention to the area where your surgery is being performed.  If you are having back surgery, having someone wash your back for you may be helpful. Wait 2 minutes after CHG soap is applied, then you may rinse off the CHG soap.  Pat dry with a clean towel  Put on clean clothes/pajamas   If you choose to wear lotion, please use ONLY the CHG-compatible lotions on the back of this paper.     Additional instructions for the day of surgery: DO NOT APPLY any lotions, deodorants, cologne, or perfumes.   Put on clean/comfortable clothes.  Brush your teeth.  Ask your nurse before applying any prescription medications to the skin.      CHG Compatible Lotions   Aveeno Moisturizing lotion  Cetaphil Moisturizing Cream  Cetaphil Moisturizing Lotion  Clairol Herbal Essence Moisturizing Lotion, Dry Skin  Clairol Herbal Essence  Moisturizing Lotion, Extra Dry Skin  Clairol Herbal Essence Moisturizing Lotion, Normal Skin  Curel Age Defying Therapeutic Moisturizing Lotion with Alpha Hydroxy  Curel Extreme Care Body Lotion  Curel Soothing Hands Moisturizing Hand Lotion  Curel Therapeutic Moisturizing Cream, Fragrance-Free  Curel Therapeutic Moisturizing Lotion, Fragrance-Free  Curel Therapeutic Moisturizing Lotion, Original Formula  Eucerin Daily Replenishing Lotion  Eucerin Dry Skin Therapy Plus Alpha Hydroxy Crme  Eucerin Dry Skin Therapy Plus Alpha Hydroxy Lotion  Eucerin Original Crme  Eucerin Original Lotion  Eucerin Plus Crme Eucerin Plus Lotion  Eucerin TriLipid Replenishing Lotion  Keri Anti-Bacterial Hand Lotion  Keri Deep Conditioning Original Lotion Dry Skin Formula Softly Scented  Keri Deep Conditioning Original Lotion, Fragrance Free Sensitive Skin Formula  Keri Lotion Fast Absorbing Fragrance Free Sensitive Skin Formula  Keri Lotion Fast Absorbing Softly Scented Dry Skin Formula  Keri Original Lotion  Keri Skin Renewal Lotion Keri Silky Smooth Lotion  Keri Silky Smooth Sensitive Skin Lotion  Nivea Body Creamy Conditioning Oil  Nivea Body Extra Enriched Lotion  Nivea Body Original Lotion  Nivea Body Sheer Moisturizing Lotion Nivea Crme  Nivea Skin Firming Lotion  NutraDerm 30 Skin Lotion  NutraDerm Skin Lotion  NutraDerm Therapeutic Skin Cream  NutraDerm Therapeutic Skin Lotion  ProShield Protective Hand Cream  Provon moisturizing lotion  Preparing for Total Shoulder Arthroplasty  Before surgery, you can play an important role by reducing the number of germs on your skin by using the following products:  Benzoyl Peroxide Gel  o Reduces the number of germs present on the skin  o Applied twice a day to shoulder area starting two days before surgery  Chlorhexidine Gluconate (CHG) Soap  o An antiseptic cleaner that kills germs and bonds with the skin to continue killing germs even  after washing  o Used for showering the night before surgery and morning of surgery  BENZOYL PEROXIDE 5% GEL  Please do not use if you have an allergy to benzoyl peroxide. If your skin becomes reddened/irritated stop using the benzoyl peroxide.  Starting two days before surgery, apply as follows:  1. Apply benzoyl peroxide in the morning and at night. Apply after taking a shower. If you are not taking a shower, clean entire shoulder front, back, and side along with the armpit with a clean wet washcloth.  2. Place a quarter-sized dollop on your shoulder and rub in thoroughly, making sure to cover the front, back, and side of your  shoulder, along with the armpit.  2 days before ____ AM ____ PM 1 day before ____ AM ____ PM  3. Do this twice a day for two days. (Last application is the night before surgery, AFTER using the CHG soap).  4. Do NOT apply benzoyl peroxide gel on the day of surgery.How to Use an Incentive Spirometer  An incentive spirometer is a tool that measures how well you are filling your lungs with each breath. Learning to take long, deep breaths using this tool can help you keep your lungs clear and active. This may help to reverse or lessen your chance of developing breathing (pulmonary) problems, especially infection. You may be asked to use a spirometer: After a surgery. If you have a lung problem or a history of smoking. After a long period of time when you have been unable to move or be active. If the spirometer includes an indicator to show the highest number that you have reached, your health care provider or respiratory therapist will help you set a goal. Keep a log of your progress as told by your health care provider. What are the risks? Breathing too quickly may cause dizziness or cause you to pass out. Take your time so you do not get dizzy or light-headed. If you are in pain, you may need to take pain medicine before doing incentive spirometry. It is harder to  take a deep breath if you are having pain. How to use your incentive spirometer  Sit up on the edge of your bed or on a chair. Hold the incentive spirometer so that it is in an upright position. Before you use the spirometer, breathe out normally. Place the mouthpiece in your mouth. Make sure your lips are closed tightly around it. Breathe in slowly and as deeply as you can through your mouth, causing the piston or the ball to rise toward the top of the chamber. Hold your breath for 3-5 seconds, or for as long as possible. If the spirometer includes a coach indicator, use this to guide you in breathing. Slow down your breathing if the indicator goes above the marked areas. Remove the mouthpiece from your mouth and breathe out normally. The piston or ball will return to the bottom of the chamber. Rest for a few seconds, then repeat the steps 10 or more times. Take your time and take a few normal breaths between deep breaths so that you do not get dizzy or light-headed. Do this every 1-2 hours when you are awake. If the spirometer includes a goal marker to show the highest number you have reached (best effort), use this as a goal to work toward during each repetition. After each set of 10 deep breaths, cough a few times. This will help to make sure that your lungs are clear. If you have an incision on your chest or abdomen from surgery, place a pillow or a rolled-up towel firmly against the incision when you cough. This can help to reduce pain while taking deep breaths and coughing. General tips When you are able to get out of bed: Walk around often. Continue to take deep breaths and cough in order to clear your lungs. Keep using the incentive spirometer until your health care provider says it is okay to stop using it. If you have been in the hospital, you may be told to keep using the spirometer at home. Contact a health care provider if: You are having difficulty using the spirometer. You  have trouble  using the spirometer as often as instructed. Your pain medicine is not giving enough relief for you to use the spirometer as told. You have a fever. Get help right away if: You develop shortness of breath. You develop a cough with bloody mucus from the lungs. You have fluid or blood coming from an incision site after you cough. Summary An incentive spirometer is a tool that can help you learn to take long, deep breaths to keep your lungs clear and active. You may be asked to use a spirometer after a surgery, if you have a lung problem or a history of smoking, or if you have been inactive for a long period of time. Use your incentive spirometer as instructed every 1-2 hours while you are awake. If you have an incision on your chest or abdomen, place a pillow or a rolled-up towel firmly against your incision when you cough. This will help to reduce pain. Get help right away if you have shortness of breath, you cough up bloody mucus, or blood comes from your incision when you cough. This information is not intended to replace advice given to you by your health care provider. Make sure you discuss any questions you have with your health care provider. Document Revised: 05/13/2019 Document Reviewed: 05/13/2019 Elsevier Patient Education  2023 [employer].   Please go to the following website to access important education materials concerning your upcoming joint replacement.                                   [email or web address]

## 2023-01-25 ENCOUNTER — Encounter
Admission: RE | Admit: 2023-01-25 | Discharge: 2023-01-25 | Disposition: A | Discharge status: Home / Self Care | Payer: PPO | Source: Ambulatory Visit | Attending: Orthopedic Surgery | Admitting: Orthopedic Surgery

## 2023-01-25 DIAGNOSIS — X58XXXA Exposure to other specified factors, initial encounter: Secondary | ICD-10-CM | POA: Diagnosis not present

## 2023-01-25 DIAGNOSIS — Z01812 Encounter for preprocedural laboratory examination: Secondary | ICD-10-CM | POA: Insufficient documentation

## 2023-01-25 DIAGNOSIS — S42209A Unspecified fracture of upper end of unspecified humerus, initial encounter for closed fracture: Secondary | ICD-10-CM | POA: Diagnosis not present

## 2023-01-25 LAB — CBC WITH DIFFERENTIAL/PLATELET
Abs Immature Granulocytes: 0.05 10*3/uL (ref 0.00–0.07)
Basophils Absolute: 0 10*3/uL (ref 0.0–0.1)
Basophils Relative: 1 %
Eosinophils Absolute: 0.1 10*3/uL (ref 0.0–0.5)
Eosinophils Relative: 2 %
HCT: 42.6 % (ref 39.0–52.0)
Hemoglobin: 14 g/dL (ref 13.0–17.0)
Immature Granulocytes: 1 %
Lymphocytes Relative: 20 %
Lymphs Abs: 1.3 10*3/uL (ref 0.7–4.0)
MCH: 29.5 pg (ref 26.0–34.0)
MCHC: 32.9 g/dL (ref 30.0–36.0)
MCV: 89.9 fL (ref 80.0–100.0)
Monocytes Absolute: 0.6 10*3/uL (ref 0.1–1.0)
Monocytes Relative: 9 %
Neutro Abs: 4.4 10*3/uL (ref 1.7–7.7)
Neutrophils Relative %: 67 %
Platelets: 217 10*3/uL (ref 150–400)
RBC: 4.74 MIL/uL (ref 4.22–5.81)
RDW: 14.2 % (ref 11.5–15.5)
WBC: 6.4 10*3/uL (ref 4.0–10.5)
nRBC: 0 % (ref 0.0–0.2)

## 2023-01-25 LAB — URINALYSIS, ROUTINE W REFLEX MICROSCOPIC
Bilirubin Urine: NEGATIVE
Glucose, UA: 150 mg/dL — AB
Hgb urine dipstick: NEGATIVE
Ketones, ur: NEGATIVE mg/dL
Leukocytes,Ua: NEGATIVE
Nitrite: NEGATIVE
Protein, ur: NEGATIVE mg/dL
Specific Gravity, Urine: 1.013 (ref 1.005–1.030)
pH: 5 (ref 5.0–8.0)

## 2023-01-25 LAB — COMPREHENSIVE METABOLIC PANEL
ALT: 21 U/L (ref 0–44)
AST: 21 U/L (ref 15–41)
Albumin: 3.5 g/dL (ref 3.5–5.0)
Alkaline Phosphatase: 58 U/L (ref 38–126)
Anion gap: 7 (ref 5–15)
BUN: 11 mg/dL (ref 8–23)
CO2: 25 mmol/L (ref 22–32)
Calcium: 8.3 mg/dL — ABNORMAL LOW (ref 8.9–10.3)
Chloride: 105 mmol/L (ref 98–111)
Creatinine, Ser: 0.82 mg/dL (ref 0.61–1.24)
GFR, Estimated: >60 mL/min (ref >60)
Glucose, Bld: 122 mg/dL — ABNORMAL HIGH (ref 70–99)
Potassium: 3.2 mmol/L — ABNORMAL LOW (ref 3.5–5.1)
Sodium: 137 mmol/L (ref 135–145)
Total Bilirubin: 1.2 mg/dL — ABNORMAL HIGH (ref <1.2)
Total Protein: 6.8 g/dL (ref 6.5–8.1)

## 2023-01-25 LAB — SURGICAL PCR SCREEN
MRSA, PCR: NEGATIVE
Staphylococcus aureus: NEGATIVE

## 2023-01-26 ENCOUNTER — Encounter: Payer: Self-pay | Admitting: Orthopedic Surgery

## 2023-01-26 MED ORDER — CEFAZOLIN SODIUM-DEXTROSE 2-4 GM/100ML-% IV SOLN
2.0000 g | INTRAVENOUS | Status: AC
  Administered 2023-01-27: 2 g via INTRAVENOUS

## 2023-01-26 MED ORDER — SODIUM CHLORIDE 0.9 % IV SOLN: INTRAVENOUS | Status: DC

## 2023-01-26 MED ORDER — CHLORHEXIDINE GLUCONATE 0.12 % MT SOLN
15.0000 mL | Freq: Once | OROMUCOSAL | Status: AC
  Administered 2023-01-27: 15 mL via OROMUCOSAL

## 2023-01-26 MED ORDER — ORAL CARE MOUTH RINSE: 15.0000 mL | Freq: Once | OROMUCOSAL | Status: AC

## 2023-01-26 MED ORDER — TRANEXAMIC ACID-NACL 1000-0.7 MG/100ML-% IV SOLN
1000.0000 mg | INTRAVENOUS | Status: AC
  Administered 2023-01-27: 1000 mg via INTRAVENOUS

## 2023-01-26 NOTE — Progress Notes (Signed)
Perioperative / Anesthesia Services  Pre-Admission Testing Clinical Review / Pre-Operative Anesthesia Consult  Date: 01/26/23  Patient Demographics:  Name: James Camacho DOB:   [birth year] MRN:   [id number]  Planned Surgical Procedure(s):    Case: [id number] Date/Time: 01/27/23 0945   Procedures:      Left reverse shoulder arthroplasty, biceps tenodesis (Left: Shoulder)     Left reverse shoulder arthroplasty, biceps tenodesis (Left: Shoulder)   Anesthesia type: Choice   Pre-op diagnosis: Closed fracture of proximal end of left humerus, unspecified fracture morphology, initial encounter S42.202A   Location: [facility] OR ROOM 05 / [facility]   Surgeons: Signa Kell, MD     NOTE: Available PAT nursing documentation and vital signs have been reviewed. Clinical nursing staff has updated patient's PMH/PSHx, current medication list, and drug allergies/intolerances to ensure comprehensive history available to assist in medical decision making as it pertains to the aforementioned surgical procedure and anticipated anesthetic course. Extensive review of available clinical information personally performed. James Camacho PMH and PSHx updated with any diagnoses/procedures that  may have been inadvertently omitted during his intake with the pre-admission testing department's nursing staff.  Clinical Discussion:  James Camacho is a 72 y.o. male who is submitted for pre-surgical anesthesia review and clearance prior to him undergoing the above procedure. Patient has never been a smoker. Pertinent PMH includes: CAD, diastolic dysfunction, angina, aortic atherosclerosis, recent near syncope, HTN, HLD, T2DM, ED (on PDE5i), LEFT intraductal papilloma (s/p excision), LEFT humeral surgical neck fracture, osteopenia, anxiety, depression.  Patient is followed by cardiology Mariah Milling, MD). He was last seen in the cardiology clinic on 12/02/2022; notes reviewed. At the time of his clinic visit,  patient with complaints of intermittent episodes of chest discomfort.  Patient was seen in 03/2020 for chest pain in the setting of vomiting and diarrhea.  He echocardiogram at that time was normal.  Patient recently seen in the ED on 11/28/2022 with complaints of anginal symptoms, diaphoresis, shortness of breath, nausea, and feelings of presyncope.  EMS was called out and patient was found to be mildly hypotensive; IV fluids given.  Cardiac workup in the ED was negative.  Patient was ultimately discharged home with plans for outpatient follow-up with his cardiologist.  Since that time, patient has had intermittent ongoing symptoms. Patient denies any episodes of PND, orthopnea, palpitations, significant peripheral edema, and syncope. Patient with a past medical history significant for cardiovascular diagnoses. Documented physical exam was grossly benign, providing no evidence of acute exacerbation and/or decompensation of the patient's known cardiovascular conditions.  Patient underwent exercise stress test on 04/17/2007.  Maximum heart rate achieved was 155 bpm after of 8 minutes and 15 seconds stress activity on Bruce protocol.  There was no evidence of stress-induced myocardial ischemia or arrhythmia; no scintigraphic evidence of scar.  Study determined to be normal and low risk.  TTE was performed on 04/24/2020 revealing a normal left ventricular systolic function with an EF of 60 to 65%.  There were no regional wall motion abnormalities.  Mild LVH was noted. Left ventricular diastolic Doppler parameters consistent with pseudonormalization (G2DD).  Right ventricular size and function normal.  Left atrium was mildly dilated.  There was mild mitral valve regurgitation. All transvalvular gradients were noted to be normal providing no evidence suggestive of valvular stenosis. Aorta normal in size with no evidence of aneurysmal dilatation.  Blood pressure well controlled at 110/60 mmHg on currently prescribed  CCB (amlodipine), beta-blocker (carvedilol), diuretic (furosemide), ACEi (lisinopril) therapies.  Patient  is on atorvastatin for his HLD diagnosis and ASCVD prevention.  T2DM well-controlled on currently prescribed regimen; last A1c at the time of his cardiology visit was 6.1%.  Of note, A1c has been rechecked since patient was seen by cardiology, with stable A1c of 6.2% when checked on 01/12/2023.  In the setting of known cardiovascular diagnoses and concurrent T2DM diagnosis, patient is on an SGLT2i (dapagliflozin) for added cardiovascular and renovascular protection.  Additionally, is important note that with patient's cardiovascular diagnoses, he is taking a PDE5i (tadalafil) for and erectile dysfunction diagnosis.  He does not have an OSAH diagnosis. Patient is able to complete all of his  ADL/IADLs without cardiovascular limitation.  Per the DASI, patient is able to achieve at least 4 METS of physical activity without experiencing any significant degree of angina/anginal equivalent symptoms. No changes were made to his medication regimen during his visit with cardiology.  Due to patient's concerns for chest discomfort and near syncope, further workup discussed.  Patient agrees to recommendations for cardiac CTA to rule out underlying cardiac ischemia.  Patient to follow-up with outpatient cardiology following study for results review and ongoing management.    James Camacho is scheduled for an LEFT REVERSE SHOULDER ARTHROPLASTY, BICEPS TENODESIS (LEFT: SHOULDER) on 01/27/2023 with Dr. Signa Kell, MD.  Given patient's past medical history significant for cardiovascular diagnoses, presurgical cardiac clearance was sought by the PAT team.  Spoke with Dr. Mariah Milling regarding ordered cardiac CTA prior to surgery.  MD indicates that it would be acceptable for patient to pursue ischemic workup following elective orthopedic surgery given that patient has an impacted comminuted fracture. Cardiology notes that  patient is stable overall.  Per cardiology, "given past medical history and time since last visit, based on ACC/AHA guidelines, James Camacho is at [facility] for the planned procedure without further cardiovascular testing.  Per Dr. Mariah Milling, patient is cleared to proceed with scheduled shoulder surgery".  In review of his medication reconciliation, it is noted that patient is currently on prescribed daily antithrombotic therapy.  Patient has been advised on recommendations from orthopedics regarding holding his daily low-dose ASA pending completion of upcoming surgery.  Patient's last dose of aspirin was taken on 01/22/2023.  Patient will resume antithrombotic therapy postoperatively when bleeding risk felt to be minimized by his primary attending surgeon.    Patient denies previous perioperative complications with anesthesia in the past. In review of the available records, it is noted that patient underwent a general anesthetic course  (ASA III) in here at [facility] without documented complications.      01/24/2023   11:13 AM 01/22/2023   12:30 AM 01/21/2023   11:18 PM  Vitals with BMI  Height 5\' 9"     Weight 240 lbs    BMI 35.43    Systolic  156 151  Diastolic  87 81  Pulse  82 76    Providers/Specialists:   NOTE: Primary physician provider listed below. Patient may have been seen by APP or partner within same practice.   PROVIDER ROLE / SPECIALTY LAST Sarina Ser, MD General Surgery (Surgeon) 01/23/2023  Karie Schwalbe, MD Primary Care Provider 01/12/2023  Julien Nordmann, MD Cardiology  12/02/2022   Allergies:  Aleve [naproxen sodium], Ibuprofen, Shrimp (diagnostic), Shrimp extract, Other, Penicillin g, and Penicillins  Current Home Medications:   No current facility-administered medications for this encounter.    ACULAR 0.5 % ophthalmic solution   amLODipine (NORVASC) 10 MG tablet  aspirin EC 81 MG tablet   atorvastatin  (LIPITOR) 10 MG tablet   carvedilol (COREG) 12.5 MG tablet   citalopram (CELEXA) 40 MG tablet   dapagliflozin propanediol (FARXIGA) 5 MG TABS tablet   fluticasone (FLONASE) 50 MCG/ACT nasal spray   furosemide (LASIX) 40 MG tablet   lisinopril (ZESTRIL) 40 MG tablet   omeprazole (PRILOSEC) 20 MG capsule   ondansetron (ZOFRAN-ODT) 4 MG disintegrating tablet   oxyCODONE (ROXICODONE) 5 MG immediate release tablet   prednisoLONE acetate (PRED FORTE) 1 % ophthalmic suspension   Semaglutide, 1 MG/DOSE, (OZEMPIC, 1 MG/DOSE,) 4 MG/3ML SOPN   tadalafil (CIALIS) 20 MG tablet   Insulin Pen Needle (PEN NEEDLES) 32G X 6 MM MISC   metoprolol tartrate (LOPRESSOR) 100 MG tablet   tirzepatide (MOUNJARO) 10 MG/0.5ML Pen   tirzepatide (MOUNJARO) 5 MG/0.5ML Pen   History:   Past Medical History:  Diagnosis Date   Anal condylomata    a.) s/p laser ablation 12/20/2018   Anginal pain (HCC)    Anxiety    Aortic atherosclerosis (HCC)    Coronary artery calcification seen on CT scan    Depression    Diastolic dysfunction    a.) TTE 04/24/2020: EF 60-65%, no RWMAs, mild LVH, G2DD, mild LAE, mild MR   ED (erectile dysfunction)    a.) on PDE5i (tadalafil)   History of adenomatous polyp of colon    History of bilateral cataract extraction 2019   History of exercise stress test 04/17/2007   a.) ETT 04/17/2007: exercised 8:15 on Bruce protocol; max HR 155 bpm; no ECG changes (rest/stress) concerning for ischemia -- negative study   Humeral surgical neck fracture (LEFT) 01/21/2023   a.) s/p mechanical fall (lost balance); radiographs (+) for comminuted impacted LEFT humeral neck fracture   Hyperlipidemia    LDL in 120's   Hypertension    Intraductal papilloma of breast, left    a.) s/p ductal system excision 11/26/2021   Long term current use of aspirin    Near syncope    OSA on CPAP    study in epic 08-11-2017 moderate osa   Osteopenia    Seasonal allergic rhinitis    Type 2 diabetes mellitus (HCC)     Wears glasses    Wears hearing aid in both ears    Past Surgical History:  Procedure Laterality Date   BREAST DUCTAL SYSTEM EXCISION Left 11/26/2021   Procedure: EXCISION DUCTAL SYSTEM BREAST;  Surgeon: Earline Mayotte, MD;  Location: [facility] ORS;  Service: General;  Laterality: Left;   CATARACT EXTRACTION W/PHACO Right 06/28/2017   Procedure: CATARACT EXTRACTION PHACO AND INTRAOCULAR LENS PLACEMENT (IOC) RIGHT;  Surgeon: Lockie Mola, MD;  Location: [facility];  Service: Ophthalmology;  Laterality: Right;   CATARACT EXTRACTION W/PHACO Left 07/19/2017   Procedure: CATARACT EXTRACTION PHACO AND INTRAOCULAR LENS PLACEMENT (IOC) LEFT TOPICAL;  Surgeon: Lockie Mola, MD;  Location: [facility];  Service: Ophthalmology;  Laterality: Left;  IVA TOPICALLEFTDIABETIC   CHOLECYSTECTOMY OPEN  1980s   dr  Okey Dupre   AND APPENDECTOMY   COLONOSCOPY WITH PROPOFOL  last one 03-28-2018   dr pyrtle   Corneal replacements Bilateral    12/23 and 2/24   LASER ABLATION CONDOLAMATA N/A 12/20/2018   Procedure: LASER ABLATION OF CONDOLAMATA, EXAMINATION UNDER ANESTHESIA;  Surgeon: Karie Soda, MD;  Location: [facility];  Service: General;  Laterality: N/A;   Family History  Problem Relation Age of Onset   Diabetes Mother    Arthritis Mother  Hypertension Mother    Chronic Renal Failure Mother    Parkinsonism Father    Heart disease Father    Healthy Sister    Healthy Sister    Healthy Brother    Cancer Neg Hx        no colon or prostate cancer   Colon cancer Neg Hx    Esophageal cancer Neg Hx    Stomach cancer Neg Hx    Rectal cancer Neg Hx    Breast cancer Neg Hx    Social History   Tobacco Use   Smoking status: Never    Passive exposure: Never   Smokeless tobacco: Never  Vaping Use   Vaping status: Never Used  Substance Use Topics   Alcohol use: Not Currently    Comment: rare   Drug use: Never    Pertinent Clinical Results:  LABS:    Hospital Outpatient Visit on 01/25/2023  Component Date Value Ref Range Status   MRSA, PCR 01/25/2023 NEGATIVE  NEGATIVE Final   Staphylococcus aureus 01/25/2023 NEGATIVE  NEGATIVE Final   Comment: (NOTE) The Xpert SA Assay (FDA approved for NASAL specimens in patients 86 years of age and older), is one component of a comprehensive surveillance program. It is not intended to diagnose infection nor to guide or monitor treatment. Performed at [facility], [address] [place], [place] [address]    WBC 01/25/2023 6.4  4.0 - 10.5 K/uL Final   RBC 01/25/2023 4.74  4.22 - 5.81 MIL/uL Final   Hemoglobin 01/25/2023 14.0  13.0 - 17.0 g/dL Final   HCT 62/13/0865 42.6  39.0 - 52.0 % Final   MCV 01/25/2023 89.9  80.0 - 100.0 fL Final   MCH 01/25/2023 29.5  26.0 - 34.0 pg Final   MCHC 01/25/2023 32.9  30.0 - 36.0 g/dL Final   RDW 78/46/9629 14.2  11.5 - 15.5 % Final   Platelets 01/25/2023 217  150 - 400 K/uL Final   nRBC 01/25/2023 0.0  0.0 - 0.2 % Final   Neutrophils Relative % 01/25/2023 67  % Final   Neutro Abs 01/25/2023 4.4  1.7 - 7.7 K/uL Final   Lymphocytes Relative 01/25/2023 20  % Final   Lymphs Abs 01/25/2023 1.3  0.7 - 4.0 K/uL Final   Monocytes Relative 01/25/2023 9  % Final   Monocytes Absolute 01/25/2023 0.6  0.1 - 1.0 K/uL Final   Eosinophils Relative 01/25/2023 2  % Final   Eosinophils Absolute 01/25/2023 0.1  0.0 - 0.5 K/uL Final   Basophils Relative 01/25/2023 1  % Final   Basophils Absolute 01/25/2023 0.0  0.0 - 0.1 K/uL Final   Immature Granulocytes 01/25/2023 1  % Final   Abs Immature Granulocytes 01/25/2023 0.05  0.00 - 0.07 K/uL Final   Performed at [facility], [address] [place], [place] [address]   Sodium 01/25/2023 137  135 - 145 mmol/L Final   Potassium 01/25/2023 3.2 (L)  3.5 - 5.1 mmol/L Final   Chloride 01/25/2023 105  98 - 111 mmol/L Final   CO2 01/25/2023 25  22 - 32 mmol/L Final   Glucose, Bld 01/25/2023 122 (H)  70 - 99  mg/dL Final   Glucose reference range applies only to samples taken after fasting for at least 8 hours.   BUN 01/25/2023 11  8 - 23 mg/dL Final   Creatinine, Ser 01/25/2023 0.82  0.61 - 1.24 mg/dL Final   Calcium 32/44/0102 8.3 (L)  8.9 - 10.3 mg/dL Final  Total Protein 01/25/2023 6.8  6.5 - 8.1 g/dL Final   Albumin 16/12/9602 3.5  3.5 - 5.0 g/dL Final   AST 54/11/8117 21  15 - 41 U/L Final   ALT 01/25/2023 21  0 - 44 U/L Final   Alkaline Phosphatase 01/25/2023 58  38 - 126 U/L Final   Total Bilirubin 01/25/2023 1.2 (H)  <1.2 mg/dL Final   GFR, Estimated 01/25/2023 >60  >60 mL/min Final   Comment: (NOTE) Calculated using the CKD-EPI Creatinine Equation (2021)    Anion gap 01/25/2023 7  5 - 15 Final   Performed at [facility], [address] [place], [place] [address]   Color, Urine 01/25/2023 YELLOW (A)  YELLOW Final   APPearance 01/25/2023 CLEAR (A)  CLEAR Final   Specific Gravity, Urine 01/25/2023 1.013  1.005 - 1.030 Final   pH 01/25/2023 5.0  5.0 - 8.0 Final   Glucose, UA 01/25/2023 150 (A)  NEGATIVE mg/dL Final   Hgb urine dipstick 01/25/2023 NEGATIVE  NEGATIVE Final   Bilirubin Urine 01/25/2023 NEGATIVE  NEGATIVE Final   Ketones, ur 01/25/2023 NEGATIVE  NEGATIVE mg/dL Final   Protein, ur 95/62/1308 NEGATIVE  NEGATIVE mg/dL Final   Nitrite 65/78/4696 NEGATIVE  NEGATIVE Final   Leukocytes,Ua 01/25/2023 NEGATIVE  NEGATIVE Final   Performed at [facility], [address] [place], [place] [address]   ABO/RH(D) 01/25/2023 O POS   Final   Antibody Screen 01/25/2023 NEG   Final   Sample Expiration 01/25/2023 02/08/2023,2359   Final   Extend sample reason 01/25/2023    Final                   Value:NO TRANSFUSIONS OR PREGNANCY IN THE PAST 3 MONTHS Performed at [facility], [address] [place], [place] [address]     ECG: Date: 12/02/2022 Time ECG obtained: 1129 AM Rate: 71 bpm Rhythm: normal sinus Axis (leads I and aVF):  Normal Intervals: PR 138 ms. QRS 96 ms. QTc 410 ms. ST segment and T wave changes: No evidence of acute ST segment elevation or depression.   Comparison: Similar to previous tracing obtained on 11/28/2022   IMAGING / PROCEDURES: CT SHOULDER LEFT WO CONTRAST performed on 01/21/2023 Acute transverse oblique surgical neck proximal left humeral fracture with moderate impaction, anterior translation and slight posterior angulation of the distal fragment. Separate longitudinal fracture through the greater tuberosity with slight comminution and dorsal distraction. Tiny nondisplaced chip fracture of the inferior articular surface of the glenoid. No dislocation. Osteopenia and degenerative change. Probable at least some degree of impingement of the supraspinatus tendon between the acromial tip and the humeral head but no evidence of a through and through tear. Small glenohumeral joint hemarthrosis. Subcutaneous stranding anterior left upper arm, likely posttraumatic.  DG SHOULDER LEFT performed on 01/21/2023 Comminuted impacted LEFT humeral neck fracture with no evidence of dislocation.  CT ANGIO CHEST PE W AND/OR WO CONTRAST performed on 11/28/2022 Negative for acute pulmonary embolus or aortic dissection. No CT evidence for acute intrathoracic abnormality.  DG CHEST 2 VIEW performed on 11/28/2022 The heart size and mediastinal contours are within normal limits. Both lungs are clear.  The visualized skeletal structures are unremarkable. No active cardiopulmonary disease.  TRANSTHORACIC ECHOCARDIOGRAM performed on 04/24/2020 Left ventricular ejection fraction, by estimation, is 60 to 65%. The left ventricle has normal function. The left ventricle has no regional wall motion abnormalities. There is mild left ventricular hypertrophy. Left ventricular diastolic parameters are consistent with Grade II  diastolic dysfunction (pseudonormalization).  Right ventricular systolic function is normal. The  right ventricular size is normal. Tricuspid regurgitation signal is inadequate for assessing PA pressure.  Left atrial size was mildly dilated.  The mitral valve is normal in structure. Mild mitral valve regurgitation.   Impression and Plan:  James Camacho has been referred for pre-anesthesia review and clearance prior to him undergoing the planned anesthetic and procedural courses. Available labs, pertinent testing, and imaging results were personally reviewed by me in preparation for upcoming operative/procedural course. [facility] medical record has been updated following extensive record review and patient interview with PAT staff.   This patient has been appropriately cleared by cardiology with an overall ACCEPTABLE risk of experiencing significant perioperative cardiovascular complications. Based on clinical review performed today (01/26/23), barring any significant acute changes in the patient's overall condition, it is anticipated that he will be able to proceed with the planned surgical intervention. Any acute changes in clinical condition may necessitate his procedure being postponed and/or cancelled. Patient will meet with anesthesia team (MD and/or CRNA) on the day of his procedure for preoperative evaluation/assessment. Questions regarding anesthetic course will be fielded at that time.   Pre-surgical instructions were reviewed with the patient during his PAT appointment, and questions were fielded to satisfaction by PAT clinical staff. He has been instructed on which medications that he will need to hold prior to surgery, as well as the ones that have been deemed safe/appropriate to take on the day of his procedure. As part of the general education provided by PAT, patient made aware both verbally and in writing, that he would need to abstain from the use of any illegal substances during his perioperative course.  He was advised that failure to follow the provided instructions could  necessitate case cancellation or result in serious perioperative complications up to and including death. Patient encouraged to contact PAT and/or his surgeon's office to discuss any questions or concerns that may arise prior to surgery; verbalized understanding.   James Mulling, MSN, APRN, FNP-C, CEN [facility]  Perioperative Services Nurse Practitioner Phone: [phone] Fax: [phone] 01/26/23 2:54 PM  NOTE: This note has been prepared using Dragon dictation software. Despite my best ability to proofread, there is always the potential that unintentional transcriptional errors may still occur from this process.

## 2023-01-27 ENCOUNTER — Other Ambulatory Visit: Payer: Self-pay

## 2023-01-27 ENCOUNTER — Encounter: Payer: Self-pay | Admitting: Orthopedic Surgery

## 2023-01-27 ENCOUNTER — Encounter
Admission: RE | Disposition: A | Discharge status: Home / Self Care | Payer: Self-pay | Source: Ambulatory Visit | Attending: Orthopedic Surgery

## 2023-01-27 ENCOUNTER — Observation Stay
Admission: RE | Admit: 2023-01-27 | Discharge: 2023-01-28 | Disposition: A | Discharge status: Home / Self Care | Payer: PPO | Source: Ambulatory Visit | Attending: Orthopedic Surgery | Admitting: Orthopedic Surgery

## 2023-01-27 ENCOUNTER — Ambulatory Visit: Payer: PPO | Admitting: Urgent Care

## 2023-01-27 ENCOUNTER — Ambulatory Visit: Payer: PPO

## 2023-01-27 ENCOUNTER — Observation Stay: Payer: PPO

## 2023-01-27 DIAGNOSIS — S42293A Other displaced fracture of upper end of unspecified humerus, initial encounter for closed fracture: Principal | ICD-10-CM | POA: Diagnosis present

## 2023-01-27 DIAGNOSIS — M6281 Muscle weakness (generalized): Secondary | ICD-10-CM | POA: Diagnosis not present

## 2023-01-27 DIAGNOSIS — M7522 Bicipital tendinitis, left shoulder: Secondary | ICD-10-CM | POA: Diagnosis not present

## 2023-01-27 DIAGNOSIS — Z79899 Other long term (current) drug therapy: Secondary | ICD-10-CM | POA: Diagnosis not present

## 2023-01-27 DIAGNOSIS — I10 Essential (primary) hypertension: Secondary | ICD-10-CM | POA: Insufficient documentation

## 2023-01-27 DIAGNOSIS — Z471 Aftercare following joint replacement surgery: Secondary | ICD-10-CM | POA: Diagnosis not present

## 2023-01-27 DIAGNOSIS — S42242A 4-part fracture of surgical neck of left humerus, initial encounter for closed fracture: Secondary | ICD-10-CM | POA: Diagnosis not present

## 2023-01-27 DIAGNOSIS — Z96612 Presence of left artificial shoulder joint: Secondary | ICD-10-CM | POA: Diagnosis not present

## 2023-01-27 DIAGNOSIS — T884XXA Failed or difficult intubation, initial encounter: Secondary | ICD-10-CM

## 2023-01-27 DIAGNOSIS — E1159 Type 2 diabetes mellitus with other circulatory complications: Secondary | ICD-10-CM

## 2023-01-27 DIAGNOSIS — Z7982 Long term (current) use of aspirin: Secondary | ICD-10-CM | POA: Diagnosis not present

## 2023-01-27 DIAGNOSIS — X58XXXA Exposure to other specified factors, initial encounter: Secondary | ICD-10-CM | POA: Insufficient documentation

## 2023-01-27 DIAGNOSIS — S42202A Unspecified fracture of upper end of left humerus, initial encounter for closed fracture: Secondary | ICD-10-CM | POA: Diagnosis not present

## 2023-01-27 DIAGNOSIS — G8918 Other acute postprocedural pain: Secondary | ICD-10-CM | POA: Diagnosis not present

## 2023-01-27 DIAGNOSIS — I2510 Atherosclerotic heart disease of native coronary artery without angina pectoris: Secondary | ICD-10-CM | POA: Diagnosis not present

## 2023-01-27 DIAGNOSIS — E119 Type 2 diabetes mellitus without complications: Secondary | ICD-10-CM | POA: Insufficient documentation

## 2023-01-27 HISTORY — DX: Other ill-defined heart diseases: I51.89

## 2023-01-27 HISTORY — DX: Atherosclerosis of aorta: I70.0

## 2023-01-27 HISTORY — DX: Atherosclerotic heart disease of native coronary artery without angina pectoris: I25.10

## 2023-01-27 HISTORY — DX: Angina pectoris, unspecified: I20.9

## 2023-01-27 HISTORY — DX: Other specified disorders of bone density and structure, unspecified site: M85.80

## 2023-01-27 HISTORY — DX: Syncope and collapse: R55

## 2023-01-27 HISTORY — PX: BICEPT TENODESIS: SHX5116

## 2023-01-27 HISTORY — PX: REVERSE SHOULDER ARTHROPLASTY: SHX5054

## 2023-01-27 HISTORY — DX: Benign neoplasm of left breast: D24.2

## 2023-01-27 HISTORY — DX: Long term (current) use of aspirin: Z79.82

## 2023-01-27 HISTORY — DX: Failed or difficult intubation, initial encounter: T88.4XXA

## 2023-01-27 LAB — TYPE AND SCREEN
ABO/RH(D): O POS
ABO/RH(D): O POS
Antibody Screen: NEGATIVE
Antibody Screen: NEGATIVE

## 2023-01-27 LAB — GLUCOSE, CAPILLARY
Glucose-Capillary: 115 mg/dL — ABNORMAL HIGH (ref 70–99)
Glucose-Capillary: 136 mg/dL — ABNORMAL HIGH (ref 70–99)

## 2023-01-27 SURGERY — ARTHROPLASTY, SHOULDER, TOTAL, REVERSE
Anesthesia: General | Site: Shoulder | Laterality: Left

## 2023-01-27 MED ORDER — TRANEXAMIC ACID-NACL 1000-0.7 MG/100ML-% IV SOLN
INTRAVENOUS | Status: AC
  Filled 2023-01-27: qty 100

## 2023-01-27 MED ORDER — OXYCODONE HCL 5 MG PO TABS
5.0000 mg | ORAL_TABLET | ORAL | Status: DC | PRN
Start: 2023-01-27 — End: 2023-01-28
  Administered 2023-01-28: 5 mg via ORAL

## 2023-01-27 MED ORDER — OXYCODONE HCL 5 MG PO TABS
10.0000 mg | ORAL_TABLET | ORAL | Status: DC | PRN
Start: 2023-01-27 — End: 2023-01-28

## 2023-01-27 MED ORDER — ACETAMINOPHEN 500 MG PO TABS
1000.0000 mg | ORAL_TABLET | Freq: Three times a day (TID) | ORAL | Status: DC
Start: 2023-01-27 — End: 2023-01-28
  Administered 2023-01-27 – 2023-01-28 (×2): 1000 mg via ORAL
  Filled 2023-01-27 (×2): qty 2

## 2023-01-27 MED ORDER — LISINOPRIL 20 MG PO TABS
40.0000 mg | ORAL_TABLET | Freq: Every day | ORAL | Status: DC
  Administered 2023-01-28: 40 mg via ORAL
  Filled 2023-01-27: qty 2

## 2023-01-27 MED ORDER — ACETAMINOPHEN 325 MG PO TABS
325.0000 mg | ORAL_TABLET | Freq: Four times a day (QID) | ORAL | Status: DC | PRN
  Filled 2023-01-27: qty 2

## 2023-01-27 MED ORDER — VANCOMYCIN HCL 1000 MG IV SOLR
INTRAVENOUS | Status: AC
  Filled 2023-01-27: qty 20

## 2023-01-27 MED ORDER — ONDANSETRON HCL 4 MG/2ML IJ SOLN
INTRAMUSCULAR | Status: AC
  Filled 2023-01-27: qty 2

## 2023-01-27 MED ORDER — BUPIVACAINE HCL (PF) 0.5 % IJ SOLN
INTRAMUSCULAR | Status: AC
Start: 2023-01-27 — End: ?
  Filled 2023-01-27: qty 10

## 2023-01-27 MED ORDER — LIDOCAINE HCL (PF) 1 % IJ SOLN
INTRAMUSCULAR | Status: DC | PRN
  Administered 2023-01-27: 5 mL via SUBCUTANEOUS

## 2023-01-27 MED ORDER — EPHEDRINE SULFATE-NACL 50-0.9 MG/10ML-% IV SOSY
PREFILLED_SYRINGE | INTRAVENOUS | Status: DC | PRN
  Administered 2023-01-27: 10 mg via INTRAVENOUS
  Administered 2023-01-27: 5 mg via INTRAVENOUS

## 2023-01-27 MED ORDER — ALUM & MAG HYDROXIDE-SIMETH 200-200-20 MG/5ML PO SUSP: 30.0000 mL | ORAL | Status: DC | PRN

## 2023-01-27 MED ORDER — KETOROLAC TROMETHAMINE 0.5 % OP SOLN
1.0000 [drp] | Freq: Four times a day (QID) | OPHTHALMIC | Status: DC
  Administered 2023-01-27 – 2023-01-28 (×2): 1 [drp] via OPHTHALMIC
  Filled 2023-01-27: qty 3

## 2023-01-27 MED ORDER — FENTANYL CITRATE (PF) 100 MCG/2ML IJ SOLN
INTRAMUSCULAR | Status: DC | PRN
  Administered 2023-01-27 (×2): 50 ug via INTRAVENOUS

## 2023-01-27 MED ORDER — BISACODYL 10 MG RE SUPP: 10.0000 mg | Freq: Every day | RECTAL | Status: DC | PRN

## 2023-01-27 MED ORDER — HYDROMORPHONE HCL 1 MG/ML IJ SOLN
INTRAMUSCULAR | Status: AC
  Filled 2023-01-27: qty 1

## 2023-01-27 MED ORDER — PHENYLEPHRINE 80 MCG/ML (10ML) SYRINGE FOR IV PUSH (FOR BLOOD PRESSURE SUPPORT)
PREFILLED_SYRINGE | INTRAVENOUS | Status: DC | PRN
  Administered 2023-01-27 (×3): 160 ug via INTRAVENOUS

## 2023-01-27 MED ORDER — PROPOFOL 10 MG/ML IV BOLUS
INTRAVENOUS | Status: AC
  Filled 2023-01-27: qty 20

## 2023-01-27 MED ORDER — CHLORHEXIDINE GLUCONATE 0.12 % MT SOLN
OROMUCOSAL | Status: AC
Start: 2023-01-27 — End: ?
  Filled 2023-01-27: qty 15

## 2023-01-27 MED ORDER — PHENYLEPHRINE HCL-NACL 20-0.9 MG/250ML-% IV SOLN
INTRAVENOUS | Status: DC | PRN
  Administered 2023-01-27: 20 ug/min via INTRAVENOUS
  Administered 2023-01-27: 40 ug/min via INTRAVENOUS

## 2023-01-27 MED ORDER — FENTANYL CITRATE (PF) 100 MCG/2ML IJ SOLN
INTRAMUSCULAR | Status: AC
  Filled 2023-01-27: qty 2

## 2023-01-27 MED ORDER — ROCURONIUM BROMIDE 10 MG/ML (PF) SYRINGE
PREFILLED_SYRINGE | INTRAVENOUS | Status: AC
  Filled 2023-01-27: qty 10

## 2023-01-27 MED ORDER — BUPIVACAINE HCL (PF) 0.5 % IJ SOLN
INTRAMUSCULAR | Status: DC | PRN
  Administered 2023-01-27: 10 mL via PERINEURAL

## 2023-01-27 MED ORDER — CITALOPRAM HYDROBROMIDE 10 MG PO TABS
20.0000 mg | ORAL_TABLET | Freq: Every day | ORAL | Status: DC
  Administered 2023-01-28: 20 mg via ORAL
  Filled 2023-01-27: qty 2

## 2023-01-27 MED ORDER — HYDROMORPHONE HCL 1 MG/ML IJ SOLN
INTRAMUSCULAR | Status: DC | PRN
  Administered 2023-01-27: .5 mg via INTRAVENOUS

## 2023-01-27 MED ORDER — PANTOPRAZOLE SODIUM 40 MG PO TBEC
40.0000 mg | DELAYED_RELEASE_TABLET | Freq: Every day | ORAL | Status: DC
  Administered 2023-01-28: 40 mg via ORAL
  Filled 2023-01-27: qty 1

## 2023-01-27 MED ORDER — DROPERIDOL 2.5 MG/ML IJ SOLN
0.6250 mg | Freq: Once | INTRAMUSCULAR | Status: DC | PRN
Start: 2023-01-27 — End: 2023-01-27

## 2023-01-27 MED ORDER — PHENYLEPHRINE HCL-NACL 20-0.9 MG/250ML-% IV SOLN
INTRAVENOUS | Status: AC
  Filled 2023-01-27: qty 250

## 2023-01-27 MED ORDER — OXYCODONE HCL 5 MG PO TABS
5.0000 mg | ORAL_TABLET | Freq: Three times a day (TID) | ORAL | Status: DC | PRN
  Filled 2023-01-27: qty 1

## 2023-01-27 MED ORDER — PEN NEEDLES 32G X 6 MM MISC: 1.0000 | Freq: Every day | Status: DC

## 2023-01-27 MED ORDER — MENTHOL 3 MG MT LOZG: 1.0000 | LOZENGE | OROMUCOSAL | Status: DC | PRN

## 2023-01-27 MED ORDER — ONDANSETRON HCL 4 MG/2ML IJ SOLN
INTRAMUSCULAR | Status: DC | PRN
  Administered 2023-01-27: 4 mg via INTRAVENOUS

## 2023-01-27 MED ORDER — LIDOCAINE HCL (PF) 1 % IJ SOLN
INTRAMUSCULAR | Status: AC
  Filled 2023-01-27: qty 5

## 2023-01-27 MED ORDER — PHENOL 1.4 % MT LIQD: 1.0000 | OROMUCOSAL | Status: DC | PRN

## 2023-01-27 MED ORDER — CARVEDILOL 12.5 MG PO TABS
12.5000 mg | ORAL_TABLET | Freq: Two times a day (BID) | ORAL | Status: DC
  Administered 2023-01-27 – 2023-01-28 (×2): 12.5 mg via ORAL
  Filled 2023-01-27 (×2): qty 1

## 2023-01-27 MED ORDER — FUROSEMIDE 40 MG PO TABS
40.0000 mg | ORAL_TABLET | Freq: Every day | ORAL | Status: DC
  Administered 2023-01-28: 40 mg via ORAL
  Filled 2023-01-27: qty 1

## 2023-01-27 MED ORDER — ONDANSETRON HCL 4 MG/2ML IJ SOLN: 4.0000 mg | Freq: Four times a day (QID) | INTRAMUSCULAR | Status: DC | PRN

## 2023-01-27 MED ORDER — BUPIVACAINE LIPOSOME 1.3 % IJ SUSP
INTRAMUSCULAR | Status: DC | PRN
  Administered 2023-01-27: 20 mL via PERINEURAL

## 2023-01-27 MED ORDER — LIDOCAINE HCL (CARDIAC) PF 100 MG/5ML IV SOSY
PREFILLED_SYRINGE | INTRAVENOUS | Status: DC | PRN
  Administered 2023-01-27: 100 mg via INTRAVENOUS

## 2023-01-27 MED ORDER — ONDANSETRON HCL 4 MG PO TABS: 4.0000 mg | ORAL_TABLET | Freq: Four times a day (QID) | ORAL | Status: DC | PRN

## 2023-01-27 MED ORDER — ROCURONIUM BROMIDE 100 MG/10ML IV SOLN
INTRAVENOUS | Status: DC | PRN
  Administered 2023-01-27: 30 mg via INTRAVENOUS
  Administered 2023-01-27: 50 mg via INTRAVENOUS
  Administered 2023-01-27: 10 mg via INTRAVENOUS

## 2023-01-27 MED ORDER — FENTANYL CITRATE PF 50 MCG/ML IJ SOSY
PREFILLED_SYRINGE | INTRAMUSCULAR | Status: AC
  Filled 2023-01-27: qty 1

## 2023-01-27 MED ORDER — DEXAMETHASONE SODIUM PHOSPHATE 10 MG/ML IJ SOLN
INTRAMUSCULAR | Status: AC
  Filled 2023-01-27: qty 1

## 2023-01-27 MED ORDER — VANCOMYCIN HCL 1000 MG IV SOLR
INTRAVENOUS | Status: DC | PRN
  Administered 2023-01-27: 1000 mg via TOPICAL

## 2023-01-27 MED ORDER — TRANEXAMIC ACID-NACL 1000-0.7 MG/100ML-% IV SOLN
1000.0000 mg | Freq: Once | INTRAVENOUS | Status: AC
  Administered 2023-01-27: 1000 mg via INTRAVENOUS

## 2023-01-27 MED ORDER — PROPOFOL 10 MG/ML IV BOLUS
INTRAVENOUS | Status: DC | PRN
  Administered 2023-01-27: 200 mg via INTRAVENOUS

## 2023-01-27 MED ORDER — DOCUSATE SODIUM 100 MG PO CAPS
100.0000 mg | ORAL_CAPSULE | Freq: Two times a day (BID) | ORAL | Status: DC
  Administered 2023-01-27 – 2023-01-28 (×2): 100 mg via ORAL
  Filled 2023-01-27 (×2): qty 1

## 2023-01-27 MED ORDER — MIDAZOLAM HCL 2 MG/2ML IJ SOLN
1.0000 mg | INTRAMUSCULAR | Status: DC | PRN
Start: 2023-01-27 — End: 2023-01-27
  Administered 2023-01-27: 1 mg via INTRAVENOUS

## 2023-01-27 MED ORDER — 0.9 % SODIUM CHLORIDE (POUR BTL) OPTIME: TOPICAL | Status: DC | PRN

## 2023-01-27 MED ORDER — CEFAZOLIN SODIUM-DEXTROSE 2-4 GM/100ML-% IV SOLN
2.0000 g | Freq: Four times a day (QID) | INTRAVENOUS | Status: AC
  Administered 2023-01-27 – 2023-01-28 (×3): 2 g via INTRAVENOUS
  Filled 2023-01-27 (×3): qty 100

## 2023-01-27 MED ORDER — DEXAMETHASONE SODIUM PHOSPHATE 10 MG/ML IJ SOLN
INTRAMUSCULAR | Status: DC | PRN
  Administered 2023-01-27: 10 mg via INTRAVENOUS

## 2023-01-27 MED ORDER — ACETAMINOPHEN 10 MG/ML IV SOLN
INTRAVENOUS | Status: DC | PRN
  Administered 2023-01-27: 1000 mg via INTRAVENOUS

## 2023-01-27 MED ORDER — SUGAMMADEX SODIUM 200 MG/2ML IV SOLN
INTRAVENOUS | Status: DC | PRN
  Administered 2023-01-27: 400 mg via INTRAVENOUS

## 2023-01-27 MED ORDER — DEXMEDETOMIDINE HCL IN NACL 80 MCG/20ML IV SOLN
INTRAVENOUS | Status: DC | PRN
  Administered 2023-01-27: 12 ug via INTRAVENOUS

## 2023-01-27 MED ORDER — PREDNISOLONE ACETATE 1 % OP SUSP
1.0000 [drp] | OPHTHALMIC | Status: DC
  Administered 2023-01-27 – 2023-01-28 (×4): 1 [drp] via OPHTHALMIC
  Filled 2023-01-27: qty 1

## 2023-01-27 MED ORDER — DAPAGLIFLOZIN PROPANEDIOL 5 MG PO TABS
5.0000 mg | ORAL_TABLET | Freq: Every day | ORAL | Status: DC
  Administered 2023-01-28: 5 mg via ORAL
  Filled 2023-01-27: qty 1

## 2023-01-27 MED ORDER — ASPIRIN 325 MG PO TBEC
325.0000 mg | DELAYED_RELEASE_TABLET | Freq: Every day | ORAL | Status: DC
  Administered 2023-01-28: 325 mg via ORAL
  Filled 2023-01-27: qty 1

## 2023-01-27 MED ORDER — AMLODIPINE BESYLATE 10 MG PO TABS
10.0000 mg | ORAL_TABLET | Freq: Every day | ORAL | Status: DC
  Administered 2023-01-28: 10 mg via ORAL
  Filled 2023-01-27: qty 1

## 2023-01-27 MED ORDER — HYDROMORPHONE HCL 1 MG/ML IJ SOLN
0.2000 mg | INTRAMUSCULAR | Status: DC | PRN
Start: 2023-01-27 — End: 2023-01-28

## 2023-01-27 MED ORDER — ATORVASTATIN CALCIUM 10 MG PO TABS
10.0000 mg | ORAL_TABLET | Freq: Every day | ORAL | Status: DC
  Administered 2023-01-28: 10 mg via ORAL
  Filled 2023-01-27: qty 1

## 2023-01-27 MED ORDER — CEFAZOLIN SODIUM-DEXTROSE 2-4 GM/100ML-% IV SOLN
INTRAVENOUS | Status: AC
  Filled 2023-01-27: qty 100

## 2023-01-27 MED ORDER — SENNOSIDES-DOCUSATE SODIUM 8.6-50 MG PO TABS: 1.0000 | ORAL_TABLET | Freq: Every evening | ORAL | Status: DC | PRN

## 2023-01-27 MED ORDER — FENTANYL CITRATE PF 50 MCG/ML IJ SOSY
50.0000 ug | PREFILLED_SYRINGE | Freq: Once | INTRAMUSCULAR | Status: AC
  Administered 2023-01-27: 50 ug via INTRAVENOUS

## 2023-01-27 MED ORDER — LACTATED RINGERS IR SOLN
Status: DC | PRN
  Administered 2023-01-27: 500 mL

## 2023-01-27 MED ORDER — ACETAMINOPHEN 10 MG/ML IV SOLN
INTRAVENOUS | Status: AC
Start: 2023-01-27 — End: ?
  Filled 2023-01-27: qty 100

## 2023-01-27 MED ORDER — BUPIVACAINE LIPOSOME 1.3 % IJ SUSP
INTRAMUSCULAR | Status: AC
  Filled 2023-01-27: qty 20

## 2023-01-27 MED ORDER — EPHEDRINE 5 MG/ML INJ
INTRAVENOUS | Status: AC
  Filled 2023-01-27: qty 5

## 2023-01-27 MED ORDER — FLUTICASONE PROPIONATE 50 MCG/ACT NA SUSP: 1.0000 | Freq: Every day | NASAL | Status: DC | PRN

## 2023-01-27 MED ORDER — MIDAZOLAM HCL 2 MG/2ML IJ SOLN
INTRAMUSCULAR | Status: AC
Start: 2023-01-27 — End: ?
  Filled 2023-01-27: qty 2

## 2023-01-27 MED ORDER — FENTANYL CITRATE (PF) 100 MCG/2ML IJ SOLN: 25.0000 ug | INTRAMUSCULAR | Status: DC | PRN

## 2023-01-27 MED ORDER — LIDOCAINE HCL (PF) 2 % IJ SOLN
INTRAMUSCULAR | Status: AC
  Filled 2023-01-27: qty 5

## 2023-01-27 SURGICAL SUPPLY — 77 items
BIT DRILL 3 FOR 4.5 SCREW (BIT) ×1
BIT DRILL 3MM FOR 4.5 SCREW (BIT) IMPLANT
BLADE SAGITTAL WIDE XTHICK NO (BLADE) ×1 IMPLANT
BSPLAT GLND 10D RSS AETOS (Plate) ×1 IMPLANT
CHLORAPREP W/TINT 26 (MISCELLANEOUS) ×1 IMPLANT
CNTNR URN SCR LID CUP LEK RST (MISCELLANEOUS) IMPLANT
COOLER POLAR GLACIER W/PUMP (MISCELLANEOUS) ×1 IMPLANT
DERMABOND ADVANCED .7 DNX12 (GAUZE/BANDAGES/DRESSINGS) IMPLANT
DRAPE INCISE IOBAN 66X45 STRL (DRAPES) ×2 IMPLANT
DRAPE SHEET LG 3/4 BI-LAMINATE (DRAPES) ×2 IMPLANT
DRAPE TABLE BACK 80X90 (DRAPES) ×1 IMPLANT
DRAPE U-SHAPE 47X51 STRL (DRAPES) ×1 IMPLANT
DRILL BIT 3MM FOR 4.5 SCREW (BIT) ×1
DRSG OPSITE POSTOP 3X4 (GAUZE/BANDAGES/DRESSINGS) IMPLANT
DRSG OPSITE POSTOP 4X6 (GAUZE/BANDAGES/DRESSINGS) IMPLANT
DRSG OPSITE POSTOP 4X8 (GAUZE/BANDAGES/DRESSINGS) IMPLANT
DRSG TEGADERM 2-3/8X2-3/4 SM (GAUZE/BANDAGES/DRESSINGS) IMPLANT
ELECT REM PT RETURN 9FT ADLT (ELECTROSURGICAL) ×1
ELECTRODE REM PT RTRN 9FT ADLT (ELECTROSURGICAL) ×1 IMPLANT
EVACUATOR 1/8 PVC DRAIN (DRAIN) IMPLANT
GAUZE SPONGE 2X2 STRL 8-PLY (GAUZE/BANDAGES/DRESSINGS) IMPLANT
GAUZE XEROFORM 1X8 LF (GAUZE/BANDAGES/DRESSINGS) IMPLANT
GLENOID BSPLAT 10D RSS AETOS (Plate) IMPLANT
GLENOSPHERE CON AETOS 38 RSS (Shoulder) IMPLANT
GLOVE BIO SURGEON STRL SZ8 (GLOVE) IMPLANT
GLOVE BIOGEL PI IND STRL 8 (GLOVE) ×2 IMPLANT
GLOVE PI ULTRA LF STRL 7.5 (GLOVE) ×2 IMPLANT
GLOVE SURG ORTHO 8.0 STRL STRW (GLOVE) ×2 IMPLANT
GLOVE SURG SYN 8.0 (GLOVE) ×1 IMPLANT
GLOVE SURG SYN 8.0 PF PI (GLOVE) ×1 IMPLANT
GOWN STRL REUS W/ TWL LRG LVL3 (GOWN DISPOSABLE) ×2 IMPLANT
GOWN STRL REUS W/ TWL XL LVL3 (GOWN DISPOSABLE) ×1 IMPLANT
GUIDE PIN 2.0 S150MM (PIN) IMPLANT
GUIDE PIN GLENOID 2.5 NT (PIN) ×1
HOOD PEEL AWAY T7 (MISCELLANEOUS) ×2 IMPLANT
KIT STABILIZATION SHOULDER (MISCELLANEOUS) ×1 IMPLANT
LINER STD +3S RSS HXL (Liner) IMPLANT
MANIFOLD NEPTUNE II (INSTRUMENTS) ×1 IMPLANT
MASK FACE SPIDER DISP (MASK) ×1 IMPLANT
MAT ABSORB FLUID 56X50 GRAY (MISCELLANEOUS) ×1 IMPLANT
NDL REVERSE CUT 1/2 CRC (NEEDLE) IMPLANT
NDL SPNL 20GX3.5 QUINCKE YW (NEEDLE) IMPLANT
NEEDLE REVERSE CUT 1/2 CRC (NEEDLE) ×1 IMPLANT
NEEDLE SPNL 20GX3.5 QUINCKE YW (NEEDLE) IMPLANT
NS IRRIG 1000ML POUR BTL (IV SOLUTION) ×1 IMPLANT
PACK ARTHROSCOPY SHOULDER (MISCELLANEOUS) ×1 IMPLANT
PAD WRAPON POLAR SHDR XLG (MISCELLANEOUS) ×1 IMPLANT
PIN GUIDE GLENOID 2.5 NT (PIN) IMPLANT
PULSAVAC PLUS IRRIG FAN TIP (DISPOSABLE) ×1
SCREW BODY REVERSE STD (Screw) IMPLANT
SCREW CENTER 4.5X30 RSS (Screw) IMPLANT
SCREW LOCK 4.5X15 RSS (Screw) IMPLANT
SCREW LOCKING 4.5X30 RSS (Screw) IMPLANT
SLING ULTRA II LG (MISCELLANEOUS) IMPLANT
SLING ULTRA II M (MISCELLANEOUS) IMPLANT
SPONGE T-LAP 18X18 ~~LOC~~+RFID (SPONGE) ×1 IMPLANT
STAPLER SKIN PROX 35W (STAPLE) IMPLANT
STEM HUM 11 (Stem) IMPLANT
STRAP SAFETY 5IN WIDE (MISCELLANEOUS) ×1 IMPLANT
SUT ETHIBOND 5-0 MS/4 CCS GRN (SUTURE) ×2
SUT FIBERWIRE #2 38 BLUE 1/2 (SUTURE) ×1
SUT MNCRL AB 4-0 PS2 18 (SUTURE) IMPLANT
SUT PROLENE 6 0 P 1 18 (SUTURE) IMPLANT
SUT TICRON 2-0 30IN 311381 (SUTURE) ×2 IMPLANT
SUT VIC AB 0 CT1 36 (SUTURE) ×1 IMPLANT
SUT VIC AB 2-0 CT2 27 (SUTURE) ×2 IMPLANT
SUT XBRAID 1.4 BLK/WHT (SUTURE) IMPLANT
SUT XBRAID 1.4 BLUE (SUTURE) IMPLANT
SUT XBRAID 1.4 WHITE/BLUE (SUTURE) IMPLANT
SUT XBRAID 2 BLACK/BLUE (SUTURE) IMPLANT
SUTURE ETHBND 5-0 MS/4 CCS GRN (SUTURE) ×1 IMPLANT
SUTURE FIBERWR #2 38 BLUE 1/2 (SUTURE) ×1 IMPLANT
SYR 30ML LL (SYRINGE) IMPLANT
TIP FAN IRRIG PULSAVAC PLUS (DISPOSABLE) ×1 IMPLANT
TRAP FLUID SMOKE EVACUATOR (MISCELLANEOUS) ×1 IMPLANT
WATER STERILE IRR 500ML POUR (IV SOLUTION) ×1 IMPLANT
WRAPON POLAR PAD SHDR XLG (MISCELLANEOUS) ×1

## 2023-01-27 NOTE — Transfer of Care (Signed)
Immediate Anesthesia Transfer of Care Note  Patient: James Camacho  Procedure(s) Performed: Left reverse shoulder arthroplasty, biceps tenodesis (Left: Shoulder) Left reverse shoulder arthroplasty, biceps tenodesis (Left: Shoulder)  Patient Location: PACU  Anesthesia Type:General  Level of Consciousness: sedated and drowsy  Airway & Oxygen Therapy: Patient Spontanous Breathing and Patient connected to face mask oxygen  Post-op Assessment: Report given to RN and Post -op Vital signs reviewed and stable  Post vital signs: Reviewed and stable  Last Vitals:  Vitals Value Taken Time  BP 149/58 01/27/23 1451  Temp    Pulse 80 01/27/23 1456  Resp 11 01/27/23 1456  SpO2 96 % 01/27/23 1456  Vitals shown include unfiled device data.  Last Pain:  Vitals:   01/27/23 0826  TempSrc: Tympanic  PainSc: 5          Complications:  Encounter Notable Events  Notable Event Outcome Phase Comment  Difficult to intubate - expected  Intraprocedure Filed from anesthesia note documentation.

## 2023-01-27 NOTE — Anesthesia Procedure Notes (Signed)
Procedure Name: Intubation Date/Time: 01/27/2023 11:47 AM  Performed by: Ginger Carne, CRNAPre-anesthesia Checklist: Patient identified, Emergency Drugs available, Suction available, Patient being monitored and Timeout performed Patient Re-evaluated:Patient Re-evaluated prior to induction Oxygen Delivery Method: Circle system utilized Preoxygenation: Pre-oxygenation with 100% oxygen Induction Type: IV induction Ventilation: Oral airway inserted - appropriate to patient size and Two handed mask ventilation required Laryngoscope Size: McGrath and 3 Grade View: Grade II Tube type: Oral Tube size: 7.0 mm Number of attempts: 1 Airway Equipment and Method: Stylet and Video-laryngoscopy Placement Confirmation: ETT inserted through vocal cords under direct vision, positive ETCO2 and breath sounds checked- equal and bilateral Secured at: 22 cm Tube secured with: Tape Dental Injury: Teeth and Oropharynx as per pre-operative assessment  Difficulty Due To: Difficulty was anticipated, Difficult Airway- due to reduced neck mobility and Difficult Airway- due to limited oral opening

## 2023-01-27 NOTE — Anesthesia Preprocedure Evaluation (Signed)
Anesthesia Evaluation  Patient identified by MRN, date of birth, ID band Patient awake    Reviewed: Allergy & Precautions, NPO status , Patient's Chart, lab work & pertinent test results  History of Anesthesia Complications Negative for: history of anesthetic complications  Airway Mallampati: III  TM Distance: >3 FB Neck ROM: Full    Dental  (+) Teeth Intact, Dental Advidsory Given   Pulmonary neg shortness of breath, sleep apnea , neg COPD, neg recent URI   Pulmonary exam normal  + decreased breath sounds      Cardiovascular Exercise Tolerance: Good hypertension, Pt. on medications (-) angina + CAD  (-) Past MI and (-) Cardiac Stents Normal cardiovascular exam(-) dysrhythmias (-) Valvular Problems/Murmurs Rhythm:Regular Rate:Normal     Neuro/Psych  PSYCHIATRIC DISORDERS Anxiety Depression    negative neurological ROS     GI/Hepatic negative GI ROS, Neg liver ROS,GERD  Medicated,,  Endo/Other  negative endocrine ROSdiabetes, Well Controlled  Class 3 obesity  Renal/GU   negative genitourinary   Musculoskeletal negative musculoskeletal ROS (+)    Abdominal  (+) + obese  Peds negative pediatric ROS (+)  Hematology negative hematology ROS (+)   Anesthesia Other Findings Past Medical History: 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 No date: Anxiety No date: Depression No date: ED (erectile dysfunction) No date: History of adenomatous polyp of colon No date: History of exercise stress test     Comment:  ETT 04-17-2007 (in epic)  negative No date: Hyperlipidemia     Comment:  LDL in 120's No date: Hypertension     Comment:  followed by pcp No date: OSA (obstructive sleep apnea) No date: OSA on CPAP     Comment:  study in epic 08-11-2017 moderate osa No date: Seasonal allergic rhinitis No date: Type 2 diabetes mellitus (HCC)     Comment:  followed by pcp No date: Wears glasses No date: Wears hearing aid in both  ears  Past Surgical History: 06/28/2017: CATARACT EXTRACTION W/PHACO; Right     Comment:  Procedure: CATARACT EXTRACTION PHACO AND INTRAOCULAR               LENS PLACEMENT (IOC) RIGHT;  Surgeon: Lockie Mola, MD;  Location: [facility];  Service:               Ophthalmology;  Laterality: Right; 07/19/2017: CATARACT EXTRACTION W/PHACO; Left     Comment:  Procedure: CATARACT EXTRACTION PHACO AND INTRAOCULAR               LENS PLACEMENT (IOC) LEFT TOPICAL;  Surgeon: Lockie Mola, MD;  Location: [facility];  Service:               Ophthalmology;  Laterality: Left;  IVA               TOPICALLEFTDIABETIC 1980s   dr  crawford: CHOLECYSTECTOMY OPEN     Comment:  AND APPENDECTOMY last one 03-28-2018   dr pyrtle: COLONOSCOPY WITH PROPOFOL 12/20/2018: LASER ABLATION CONDOLAMATA; N/A     Comment:  Procedure: LASER ABLATION OF CONDOLAMATA, EXAMINATION               UNDER ANESTHESIA;  Surgeon: Karie Soda, MD;  Location:              [facility];  Service: General;  Laterality: N/A;  BMI    Body Mass Index: 34.21 kg/m      Reproductive/Obstetrics negative OB ROS                             Anesthesia Physical Anesthesia Plan  ASA: 3  Anesthesia Plan: General   Post-op Pain Management:    Induction: Intravenous  PONV Risk Score and Plan: Dexamethasone, Ondansetron, Midazolam and Treatment may vary due to age or medical condition  Airway Management Planned: Oral ETT  Additional Equipment:   Intra-op Plan:   Post-operative Plan: Extubation in OR  Informed Consent: I have reviewed the patients History and Physical, chart, labs and discussed the procedure including the risks, benefits and alternatives for the proposed anesthesia with the patient or authorized representative who has indicated his/her understanding and acceptance.     Dental Advisory Given  Plan  Discussed with: CRNA and Surgeon  Anesthesia Plan Comments:         Anesthesia Quick Evaluation

## 2023-01-27 NOTE — Discharge Instructions (Signed)
James Albee, MD  [facility]  Phone: [phone]  Fax: [phone]   Discharge Instructions after Reverse Shoulder Replacement    1. Activity/Sling: You are to be non-weight bearing on operative extremity. A sling/shoulder immobilizer has been provided for you. Only remove the sling to perform elbow, wrist, and hand RoM exercises and hygiene/dressing. Active reaching and lifting are not permitted. You will be given further instructions on sling use at your first physical therapy visit and postoperative visit with Dr. [name].   2. Dressings: Dressing may be removed at 1st physical therapy visit (~3-5 days after surgery). Afterwards, you may either leave open to air (if no drainage) or cover with dry, sterile dressing. If you have steri-strips on your wound, please do not remove them. They will fall off on their own. You may shower 5 days after surgery. Please pat incision dry. Do not rub or place any shear forces across incision. If there is drainage or any opening of incision after 5 days, please notify our offices immediately.    3. Driving:  Plan on not driving for six weeks. Please note that you are advised NOT to drive while taking narcotic pain medications as you may be impaired and unsafe to drive.   4. Medications:  - You have been provided a prescription for narcotic pain medicine (usually oxycodone). After surgery, take 1-2 narcotic tablets every 4 hours if needed for severe pain. Please start this as soon as you begin to start having pain (if you received a nerve block, start taking as soon as this wears off).  - A prescription for anti-nausea medication will be provided in case the narcotic medicine causes nausea - take 1 tablet every 6 hours only if nauseated.  - Take enteric coated aspirin 325 mg once daily for 6 weeks to prevent blood clots. Do not take aspirin if you have an aspirin sensitivity/allergy or asthma or are on an anticoagulant (blood thinner) already. If so, then  your home anticoagulant will be resume and managed - do not take aspirin. -Take tylenol 1000mg  (2 Extra strength or 3 regular strength tablets) every 8 hours for pain. This will reduce the amount of narcotic medication needed. May stop tylenol when you are having minimal pain. - Take a stool softener (Colace, Dulcolax or Senakot) if you are using narcotic pain medications to help with constipation that is associated with narcotic use. - DO NOT take ANY nonsteroidal anti-inflammatory pain medications: Advil, Motrin, Ibuprofen, Aleve, Naproxen, or Naprosyn.   If you are taking prescription medication for anxiety, depression, insomnia, muscle spasm, chronic pain, or for attention deficit disorder you are advised that you are at a higher risk of adverse effects with use of narcotics post-op, including narcotic addiction/dependence, depressed breathing, death. If you use non-prescribed substances: alcohol, marijuana, cocaine, heroin, methamphetamines, etc., you are at a higher risk of adverse effects with use of narcotics post-op, including narcotic addiction/dependence, depressed breathing, death. You are advised that taking > 50 morphine milligram equivalents (MME) of narcotic pain medication per day results in twice the risk of overdose or death. For your prescription provided: oxycodone 5 mg - taking more than 6 tablets per day after the first few days of surgery.   5. Physical Therapy: 1-2 times per week for ~12 weeks. Therapy typically starts on post operative Day 3 or 4. You have been provided an order for physical therapy. The therapist will provide home exercises. Please contact our offices if this appointment has not been scheduled.  6. Work: May do light duty/desk job in approximately 2 weeks when off of narcotics, pain is well-controlled, and swelling has decreased if able to function with one arm in sling. Full work may take 6 weeks if light motions and function of both arms is required.  Lifting jobs may require 12 weeks.   7. Post-Op Appointments: Your first post-op appointment will be with Dr. [name] in approximately 2 weeks time.    If you find that they have not been scheduled please call the Orthopaedic Appointment front desk at [phone].                               James Albee, MD [facility] Phone: [phone] Fax: [phone]   REVERSE SHOULDER ARTHROPLASTY REHAB GUIDELINES   These guidelines should be tailored to individual patients based on their rehab goals, age, precautions, quality of repair, etc.  Progression should be based on patient progress and approval by the referring physician.  PHASE 1 - Day 1 through Week 2  GENERAL GUIDELINES AND PRECAUTIONS Sling wear 24/7 except during grooming and home exercises (3 to 5 times daily) Avoid shoulder extension such that the arm is posterior the frontal plane.  When patients recline, a pillow should be placed behind the upper arm and sling should be on.  They should be advised to always be able to see the elbow Avoid combined IR/ADD/EXT, such as hand behind back to prevent dislocation Avoid combined IR and ADD such as reaching across the chest to prevent dislocation No AROM No submersion in pool/water for 4 weeks No weight bearing through operative arm (as in transfers, walker use, etc.)  GOALS Maintain integrity of joint replacement; protect soft tissue healing Increase PROM for elevation to 120 and ER to 30 (will remain the goal for first 6 weeks) Optimize distal UE circulation and muscle activity (elbow, wrist and hand) Instruct in use of sling for proper fit, polar care device for ice application after HEP, signs/symptoms of infection  EXERCISES Active elbow, wrist and hand Passive forward elevation in scapular plane to 90-120 max motion; ER in scapular plane to 30 Active scapular retraction with arms resting in neutral position  CRITERIA TO PROGRESS TO  PHASE 2 Low pain (less than 3/10) with shoulder PROM Healing of incision without signs of infection Clearance by MD to advance after 2 week MD check up  PHASE 2 - 2 weeks - 6 weeks  GENERAL GUIDELINES AND PRECAUTIONS Sling may be removed while at home; worn in community without abduction pillow May use arm for light activities of daily living (such as feeding, brushing teeth, dressing.) with elbow near  the side of the body  and arm in front of the body- no active lifting of the arm May submerge in water (tub, pool, [place], etc.) after 4 weeks Continue to avoid WBing through the operative arm Continue to avoid combined IR/EXT/ADD (hand behind the back) and IR/ADD  (reaching across chest) for dislocation precautions  GOALS  Achieve passive elevation to 120 and ER to 30  Low (less than 3/10) to no pain  Ability to fire all heads of the deltoid  EXERCISES May discontinue grip, and active elbow and wrist exercises since using the arm in ADL's  with sling removed around the home Continue passive elevation to 120 and ER to 30, both in scapular plane with arm supported on table top Add submaximal isometrics, pain free effort, for all  functional heads of deltoid (anterior, posterior, middle)  Ensure that with posterior deltoid isometric the shoulder does not move into extension and the arm remains anterior the frontal plane At 4 weeks:  begin to place arm in balanced position of 90 deg elevation in supine; when patient able to hold this position with ease, may begin reverse pendulums clockwise and counterclockwise  CRITERIA TO PROGRESS TO PHASE 3 Passive forward elevation in scapular plane to 120; passive ER in scapular plane to 30 Ability to fire isometrically all heads of the deltoid muscle without pain Ability to place and hold the arm in balanced position (90 deg elevation in supine)  PHASE 3 - 6 weeks to 3 months  GENERAL GUIDELINES AND PRECAUTIONS Discontinue use of sling Avoid  forcing end range motion in any direction to prevent dislocation  May advance use of the arm actively in ADL's without being restricted to arm by the side of the body, however, avoid heavy lifting and sports (forever!) May initiate functional IR behind the back gently NO UPPER BODY ERGOMETER   GOALS Optimize PROM for elevation and ER in scapular plane with realistic expectation that max  mobility for elevation is usually around 145-160 passively; ER 40 to 50 passively; functional IR to L1 Recover AROM to approach as close to PROM available as possible; may expect 135-150 deg active elevation; 30 deg active ER; active functional IR to L1 Establish dynamic stability of the shoulder with deltoid and periscapular muscle gradual strengthening  EXERCISES Forward elevation in scapular plane active progression: supine to incline, to vertical; short to long lever arm Balanced position long lever arm AROM Active ER/IR with arm at side Scapular retraction with light band resistance Functional IR with hand slide up back - very gentle and gradual NO UPPER BODY ERGOMETER     CRITERIA TO PROGRESS TO PHASE 4  AROM equals/approaches PROM with good mechanics for elevation   No pain  Higher level demand on shoulder than ADL functions   PHASE 4 12 months and beyond  GENERAL GUIDELINES AND PRECAUTIONS No heavy lifting and no overhead sports No heavy pushing activity Gradually increase strength of deltoid and scapular stabilizers; also the rotator cuff if present with weights not to exceed 5 lbs NO UPPER BODY ERGOMETER   GOALS  Optimize functional use of the operative UE to meet the desired demands  Gradual increase in deltoid, scapular muscle, and rotator cuff strength  Pain free functional activities   EXERCISES Add light hand weights for deltoid up to and not to exceed 3 lbs for anterior and posterior with long arm lift against gravity; elbow bent to 90 deg for abduction in scapular  plane Theraband progression for extension to hip with scapular depression/retraction Theraband progression for serratus anterior punches in supine; avoid wall, incline or prone pressups for serratus anterior End range stretching gently without forceful overpressure in all planes (elevation in scapular plane, ER in scapular plane, functional IR) with stretching done for life as part of a daily routine NO UPPER BODY ERGOMETER     CRITERIA FOR DISCHARGE FROM SKILLED PHYSICAL THERAPY  Pain free AROM for shoulder elevation (expect around 135-150)  Functional strength for all ADL's, work tasks, and hobbies approved by Careers adviser  Independence with home maintenance program   NOTES: 1. With proper exercise, motion, strength, and function continue to improve even after one year. 2. The complication rate after surgery is 5 - 8%. Complications include infection, fracture, heterotopic bone formation, nerve injury, instability, rotator cuff  tear, and tuberosity nonunion. Please look for clinical signs, unusual symptoms, or lack of progress with therapy and report those to Dr. [name]. Prefer more communication than less.  3. The therapy plan above only serves as a guide. Please be aware of specific individualized patient instructions as written on the prescription or through discussions with the surgeon. 4. Please call Dr. [name] if you have any specific questions or concerns [phone]

## 2023-01-27 NOTE — H&P (Signed)
Paper H&P to be scanned into permanent record. H&P reviewed. No significant changes noted.

## 2023-01-27 NOTE — Anesthesia Procedure Notes (Signed)
Anesthesia Regional Block: Interscalene brachial plexus block   Pre-Anesthetic Checklist: , timeout performed,  Correct Patient, Correct Site, Correct Laterality,  Correct Procedure, Correct Position, site marked,  Risks and benefits discussed,  Surgical consent,  Pre-op evaluation,  At surgeon's request and post-op pain management  Laterality: Left and Upper  Prep: chloraprep       Needles:  Injection technique: Single-shot  Needle Type: Stimiplex     Needle Length: 5cm  Needle Gauge: 22     Additional Needles:   Procedures:,,,, ultrasound used (permanent image in chart),,    Narrative:  Start time: 01/27/2023 10:12 AM End time: 01/27/2023 10:15 AM Injection made incrementally with aspirations every 5 mL.  Performed by: Personally  Anesthesiologist: Lenard Simmer, MD  Additional Notes: Functioning IV was confirmed and monitors were applied.  A 50mm 22ga Stimuplex needle was used. Sterile prep and drape,hand hygiene and sterile gloves were used.  Negative aspiration and negative test dose prior to incremental administration of local anesthetic. The patient tolerated the procedure well.

## 2023-01-27 NOTE — Op Note (Signed)
Operative Note    SURGERY DATE: 01/27/2023   PRE-OP DIAGNOSIS:  1. Left 4-part proximal humerus fracture   POST-OP DIAGNOSIS:  1. Left 4-part proximal humerus fracture   PROCEDURES:  1. Left reverse total shoulder arthroplasty 2. Left biceps tenodesis   SURGEON: Rosealee Albee, MD  ASSISTANTS: Dedra Skeens, PA   ANESTHESIA: Gen + Exparel interscalene block   ESTIMATED BLOOD LOSS: 150cc   TOTAL IV FLUIDS: see anesthesia record  IMPLANTS: S&N [employer] 10 deg Augmented baseplate w/central + 3 peripheral 4.24mm screws, 38mm concentric glenosphere; Titan Reverse Body Standard and Body Screw; TSS Press Fit Size 11 stem; +3 Standard Poly Liner  INDICATION(S):  James Camacho is 72 y.o. male who sustained a displaced 4-part proximal humerus fracture. After discussion of risks, benefits, and alternatives to surgery, the patient elected to proceed with reverse shoulder arthroplasty and biceps tenodesis.   OPERATIVE FINDINGS: 4-part proximal humerus fracture, biceps tendinopathy   OPERATIVE REPORT:   I identified James Camacho in the pre-operative holding area. Informed consent was obtained and the surgical site was marked. I reviewed the risks and benefits of the proposed surgical intervention and the patient (and/or patient's guardian) wished to proceed. An interscalene block with Exparel was administered by the Anesthesia team. The patient was transferred to the operative suite and general anesthesia was administered. The patient was placed in the beach chair position with the head of the bed elevated approximately 30 degrees. All down side pressure points were appropriately padded. Appropriate IV antibiotics were administered. Tranexamic acid was also administered after verifying that the patient had no contraindications. The extremity was then prepped and draped in standard fashion. A time out was performed confirming the correct extremity, correct patient, and correct procedure.   We  used the standard deltopectoral incision from the coracoid to ~12cm distal.  Vancomycin powder was placed in the dermis.  We found the cephalic vein and took it laterally. We opened the deltopectoral interval widely and placed retractors under the CA ligament in the subacromial space and under the deltoid tendon at its insertion. We then released the underlying bursa between these retractors, taking care not to damage the circumflex branch of the axillary nerve.   We opened the clavipectoral fascia lateral to the conjoint tendon.  The arm was then internally rotated, we cut the falciform ligament at approximately 1 cm of the upper portion of the pectoralis major insertion. Next we unroofed the bicipital groove. Biceps tendon was significantly erythematous and inflamed. We proceeded with a soft tissue biceps tenodesis given the pathology of the tendon.  After opening the biceps tendon sheath all the way to the supraglenoid tubercle, we performed a biceps tenodesis with two #2 TiCron sutures to the upper border of the pectoralis major. The proximal portion of the tendon was excised.   FiberWire sutures were placed within the supraspinatus, infraspinatus, and subscapularis to obtain appropriate control of the tuberosities. The articular humeral head portion was removed.  The supraspinatus tendon itself was excised.  The infraspinatus was noted to be attached to the greater tuberosity fragment.  Cancellous bone was removed from the removed humeral head to be used later as bone graft.  #5 Ethibond sutures were placed around the greater tuberosity at the bone-tendon junction.  Additionally, two  X-Braid suture were placed around the greater tuberosity (one to serve as tuberosity to fin sutures and one to serve as an around the world suture).   A posterior retractor was used to retract greater tuberosity and  the humeral shaft, exposing the glenoid.  The anterior capsule was separated from the subscapularis and  excised from the lesser tuberosity to the glenoid.  The labrum and the remnant of the biceps tendon were removed in a circumferential fashion.  The attachment of the triceps muscle was released off of the inferior glenoid.  During the glenoid exposure, the axillary nerve was protected the entire time.  Appropriate retractors were placed and there was excellent glenoid visualization.  The central guidepin was drilled with a 10 degree inferior tilt guide.  On preoperative imaging, the patient had approximately relatively neutral glenoid version.  This was not changed.  The glenoid was reamed in order to remove the cartilaginous surface without taking away too much bone.  The hole for the central post was drilled. The baseplate was impacted in a press-fit fashion. The central screw was placed.  Locking peripheral screws were drilled, measured, and placed.  Peripheral reamer was used to clear out any debris and allow for appropriate glenosphere placement.  Glenosphere was impacted and found to be firmly seated.  Glenosphere screw was tightened.   We then turned our attention to the humerus.  The humeral canal was sounded and sized appropriately until excellent diaphyseal fit was achieved.  Broaching was performed at ~20 degrees of retroversion.  Standard body with various sizes of the poly was trialed.  The joint was reduced and noted to have satisfactory stability, motion, and deltoid tension with adequate tuberosity reduction.  Trial implants were removed.  We drilled two holes into the shaft on either side of the bicipital groove ~2cm inferior to the humeral fracture line and passed two Xbraid tapes through both holes to serve as vertical tuberosity fixation sutures.  The previously passed Xbraid sutures in the greater tuberosity were passed through the lateral suture holes in the stem.  The stem was then inserted into the appropriate position.  Excellent press-fit was achieved.  Poly was trialed and above  noted poly size was placed.   Next, #5 Ethibond sutures from the greater tuberosity were passed around the lesser tuberosity to serve as horizontal cerclage sutures.  One of the Xbraid tapes previously passed through the implant was then passed around the lesser tuberosity as well to serve as an around the world suture (greater tuberosity to stem to the lesser tuberosity).  Both vertical sutures were passed through the greater and lesser tuberosities.  Bone graft was placed between the tuberosities and the implant to help stimulate bone healing.  The humerus was externally rotated slightly. The greater tuberosity to implant XBraid suture was tied first, securing the greater tuberosity to the implant.  Next, the horizontal cerclage Ethibond sutures were tied, and this was followed by the around the world XBraid suture. Finally, vertical sutures were tied. This achieved excellent stability of the tuberosities and they were stable to external and internal rotation.   Final confirmation of motion, tension, and stability were satisfactory. The wound was thoroughly irrigated. Vancomycin powder was placed.  A Hemovac drain was placed.    The deltopectoral interval was closed with a running, 0-Vicryl suture. The skin was closed with 2-0 Vicryl and staples. Xeroform and Honeycomb dressing were applied. A PolarCare unit and sling were placed. Patient was extubated, transferred to a stretcher bed and to the post anesthesia care unit in stable condition.    Of note, assistance from a PA was essential to performing the surgery. PA assisted with patient positioning, exposure, retraction, instrumentation, and wound closure. The surgery  would have been more difficult and had longer operative time without PA assistance.   Additionally, this case had significantly increased complexity and surgical time compared to standard reverse shoulder arthroplasty.  This was due to this being a 4 part proximal humerus fracture.  This  caused significantly distorted anatomy which required more careful and meticulous dissection, adding to surgical time.  Additionally, lesser and greater tuberosity repair required multiple sutures to be placed and tied.  Both of these factors added approximately 45 minutes to the surgical time compared to that for a standard reverse shoulder arthroplasty.   POSTOPERATIVE PLAN: Operative arm to remain in sling and abduction pillow with arm at neutral at all times except RoM exercises and hygiene. Can perform pendulums, elbow/wrist/hand RoM exercises. ASA 325mg  x 6 weeks for DVT ppx. Patient to return to clinic in ~2 weeks for post-operative appointment.

## 2023-01-28 DIAGNOSIS — S42242A 4-part fracture of surgical neck of left humerus, initial encounter for closed fracture: Secondary | ICD-10-CM | POA: Diagnosis not present

## 2023-01-28 LAB — CBC
HCT: 37.1 % — ABNORMAL LOW (ref 39.0–52.0)
Hemoglobin: 12.3 g/dL — ABNORMAL LOW (ref 13.0–17.0)
MCH: 30.2 pg (ref 26.0–34.0)
MCHC: 33.2 g/dL (ref 30.0–36.0)
MCV: 91.2 fL (ref 80.0–100.0)
Platelets: 206 10*3/uL (ref 150–400)
RBC: 4.07 MIL/uL — ABNORMAL LOW (ref 4.22–5.81)
RDW: 13.8 % (ref 11.5–15.5)
WBC: 10 10*3/uL (ref 4.0–10.5)
nRBC: 0 % (ref 0.0–0.2)

## 2023-01-28 LAB — BASIC METABOLIC PANEL
Anion gap: 6 (ref 5–15)
BUN: 10 mg/dL (ref 8–23)
CO2: 26 mmol/L (ref 22–32)
Calcium: 8 mg/dL — ABNORMAL LOW (ref 8.9–10.3)
Chloride: 102 mmol/L (ref 98–111)
Creatinine, Ser: 0.72 mg/dL (ref 0.61–1.24)
GFR, Estimated: >60 mL/min (ref >60)
Glucose, Bld: 185 mg/dL — ABNORMAL HIGH (ref 70–99)
Potassium: 3.9 mmol/L (ref 3.5–5.1)
Sodium: 134 mmol/L — ABNORMAL LOW (ref 135–145)

## 2023-01-28 MED ORDER — ONDANSETRON 4 MG PO TBDP: 4.0000 mg | ORAL_TABLET | Freq: Four times a day (QID) | ORAL | 0 refills | Status: DC | PRN

## 2023-01-28 MED ORDER — TRAMADOL HCL 50 MG PO TABS: 50.0000 mg | ORAL_TABLET | Freq: Four times a day (QID) | ORAL | 0 refills | Status: AC | PRN

## 2023-01-28 MED ORDER — ASPIRIN 325 MG PO TBEC: 325.0000 mg | DELAYED_RELEASE_TABLET | Freq: Every day | ORAL | 0 refills | Status: DC

## 2023-01-28 MED ORDER — OXYCODONE HCL 5 MG PO TABS: 5.0000 mg | ORAL_TABLET | ORAL | 0 refills | Status: DC | PRN

## 2023-01-28 NOTE — Evaluation (Signed)
Occupational Therapy Evaluation Patient Details Name: Jorian Brimley MRN: [id number] DOB: 01/27/1951 Today's Date: 01/28/2023   History of Present Illness Pt admitted to [facility] on 01/27/23 under observation for L rTSA and L bicep tenodesis to address closed 4-part fracture of proximal humerus. Significant PMH includes: HTN, dyslipidemia, , T2DM, OSA, GERD, and obesity.   Clinical Impression   Pt seen for OT evaluation this date.  Pt lives at home with his wife who he reports has dementia.  He has a supportive family and friends who will help with any needs when he returns home. Pt presents with decreased ROM, strength and use of LUE and is currently immobilized per protocol following reverse total shoulder arthroplasty. Pt would benefit from skilled OT services to maximize safety and independence in necessary daily tasks during his hospital admission. Pt wears mostly button up shirts and required assist to manage sling and polar care with UB dressing.  He also wears slacks with zippers, buttons and a belt which is difficult to manage one handed.  Recommended he consider pull on style pants temporarily, his daughter has already purchased these for home.  Pt demonstrated understanding of dressing techniques and management of polar care, he will not require additional OT services at this time, he will have assistance at home.         If plan is discharge home, recommend the following: A little help with bathing/dressing/bathroom;Assistance with cooking/housework    Functional Status Assessment  Patient has had a recent decline in their functional status and demonstrates the ability to make significant improvements in function in a reasonable and predictable amount of time.  Equipment Recommendations       Recommendations for Other Services       Precautions / Restrictions Precautions Precautions: Shoulder Type of Shoulder Precautions: rTSA; NWB LUE, no A/PROM to shoulder; elbow/wrist/hand  AROM ok Shoulder Interventions: Shoulder abduction pillow;At all times;Off for dressing/bathing/exercises;Shoulder sling/immobilizer Restrictions Weight Bearing Restrictions: Yes LUE Weight Bearing: Non weight bearing      Mobility Bed Mobility               General bed mobility comments: Pt up to chair this morning    Transfers Overall transfer level: Modified independent                 General transfer comment: IND to stand from EOB and sit in recliner with use of RUE for support, no AD, also independence from regular height toilet      Balance Overall balance assessment: No apparent balance deficits (not formally assessed)                                         ADL either performed or assessed with clinical judgement   ADL Overall ADL's : Needs assistance/impaired Eating/Feeding: Independent Eating/Feeding Details (indicate cue type and reason): using right hand only, assist with tasks that require 2 hands   Grooming Details (indicate cue type and reason): will need assist with managing deodorant under arm Upper Body Bathing: Minimal assistance   Lower Body Bathing: Set up   Upper Body Dressing : Moderate assistance Upper Body Dressing Details (indicate cue type and reason): Instruction on UB dressing techniques, management of sling Lower Body Dressing: Minimal assistance Lower Body Dressing Details (indicate cue type and reason): assistance with zippers, buttons Toilet Transfer: Modified Independent     Toileting - Clothing Manipulation Details (  indicate cue type and reason): Recommend pull on style pants at home since he will need assist with zipper, button and belt     Functional mobility during ADLs: Modified independent       Vision Baseline Vision/History: 1 Wears glasses       Perception         Praxis         Pertinent Vitals/Pain Pain Assessment Pain Assessment: 0-10 Pain Score: 3  Pain Location: L  shoulder Pain Descriptors / Indicators: Aching Pain Intervention(s): Limited activity within patient's tolerance, Monitored during session     Extremity/Trunk Assessment Upper Extremity Assessment Upper Extremity Assessment: Generalized weakness;Right hand dominant;LUE deficits/detail LUE Deficits / Details: in sling for LUE immobilization   Lower Extremity Assessment Lower Extremity Assessment: Defer to PT evaluation       Communication Communication Communication: No apparent difficulties   Cognition Arousal: Alert Behavior During Therapy: WFL for tasks assessed/performed Overall Cognitive Status: Within Functional Limits for tasks assessed                                 General Comments: follows multi-step commands     General Comments  bruising to LUE. Assisted with sling adjustment in sitting Pt seen for UB/LB dressing skills this date, toileting, sling and polar care management    Exercises     Shoulder Instructions      Home Living Family/patient expects to be discharged to:: Private residence Living Arrangements: Spouse/significant other Available Help at Discharge: Family;Friend(s);Available PRN/intermittently Type of Home: House Home Access: Stairs to enter Entrance Stairs-Number of Steps: 3 Entrance Stairs-Rails: None Home Layout: Able to live on main level with bedroom/bathroom Alternate Level Stairs-Number of Steps: 20 (10+platform+10) Alternate Level Stairs-Rails: Left Bathroom Shower/Tub: Producer, television/film/video: Standard     Home Equipment: Shower seat - built in;Grab bars - tub/shower;Transport chair;Hand held shower head   Additional Comments: suction cup grab bar      Prior Functioning/Environment Prior Level of Function : Independent/Modified Independent;Driving;Working/employed;History of Falls (last six months)             Mobility Comments: IND ambulation without AD. 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 Tripped over clothes resulting in humeral fracture. Otherwise no hx of falls. ADLs Comments: IND with ADL's, IADL's, medication management        OT Problem List: Decreased strength;Pain;Decreased range of motion;Increased edema;Decreased activity tolerance;Impaired sensation;Impaired UE functional use      OT Treatment/Interventions:      OT Goals(Current goals can be found in the care plan section) Acute Rehab OT Goals Patient Stated Goal: Pt reports he would like to return home and be as independent as possible OT Goal Formulation: With patient Time For Goal Achievement: 02/04/23 Potential to Achieve Goals: Good ADL Goals Pt Will Perform Upper Body Bathing: with min assist Pt Will Perform Upper Body Dressing: with min assist  OT Frequency:      Co-evaluation              AM-PAC OT "6 Clicks" Daily Activity     Outcome Measure Help from another person eating meals?: None Help from another person taking care of personal grooming?: A Little Help from another person toileting, which includes using toliet, bedpan, or urinal?: None Help from another person bathing (including washing, rinsing, drying)?: A Little Help from another person to  put on and taking off regular upper body clothing?: A Lot Help from another person to put on and taking off regular lower body clothing?: A Little 6 Click Score: 19   End of Session Equipment Utilized During Treatment: Gait belt  Activity Tolerance: Patient tolerated treatment well Patient left: in chair  OT Visit Diagnosis: Muscle weakness (generalized) (M62.81);Pain Pain - Right/Left: Left Pain - part of body: Shoulder                Time: 9562-1308 OT Time Calculation (min): 44 min Charges:  OT General Charges $OT Visit: 1 Visit OT Evaluation $OT Eval Low Complexity: 1 Low OT Treatments $Self Care/Home Management : 23-37 mins  Taniesha Glanz T Ivionna Verley, OTR/L,  CLT  Zigmund Linse 01/28/2023, 9:59 AM

## 2023-01-28 NOTE — Plan of Care (Signed)

## 2023-01-28 NOTE — Progress Notes (Signed)
Subjective: 1 Day Post-Op Procedure(s) (LRB): Left reverse shoulder arthroplasty, biceps tenodesis (Left) Left reverse shoulder arthroplasty, biceps tenodesis (Left) Patient reports pain as 0 on 0-10 scale.   Patient is well, and has had no acute complaints or problems Plan is to go Home after hospital stay. Negative for chest pain and shortness of breath Fever: no Gastrointestinal:Negative for nausea and vomiting  Objective: Vital signs in last 24 hours: Temp:  [97 F (36.1 C)-98.7 F (37.1 C)] 97.9 F (36.6 C) (11/23 0737) Pulse Rate:  [72-93] 93 (11/23 0737) Resp:  [10-18] 17 (11/23 0737) BP: (113-190)/(58-98) 113/60 (11/23 0737) SpO2:  [92 %-100 %] 95 % (11/23 0737) Weight:  [108.9 kg] 108.9 kg (11/22 0826)  Intake/Output from previous day:  Intake/Output Summary (Last 24 hours) at 01/28/2023 0749 Last data filed at 01/27/2023 1545 Gross per 24 hour  Intake 1300 ml  Output 80 ml  Net 1220 ml    Intake/Output this shift: No intake/output data recorded.  Labs: Recent Labs    01/25/23 1058 01/28/23 0437  HGB 14.0 12.3*   Recent Labs    01/25/23 1058 01/28/23 0437  WBC 6.4 10.0  RBC 4.74 4.07*  HCT 42.6 37.1*  PLT 217 206   Recent Labs    01/25/23 1058 01/28/23 0437  NA 137 134*  K 3.2* 3.9  CL 105 102  CO2 25 26  BUN 11 10  CREATININE 0.82 0.72  GLUCOSE 122* 185*  CALCIUM 8.3* 8.0*   No results for input(s): "LABPT", "INR" in the last 72 hours.   EXAM General - Patient is Alert, Appropriate, and Oriented Extremity - Sling intact to the left arm, moderate ecchymosis. Decreased sensation to the left arm from recent nerve block. He is able to flex and extend fingers well this AM. Mild bloody drainage to honeycomb dressing. Hemovac removed without issue this morning.  New 4x4 with tegaderm applied. Motor Function - intact, moving foot and toes well on exam.  Abdomen soft with intact bowels sounds.  Past Medical History:  Diagnosis Date    Anal condylomata    a.) s/p laser ablation 12/20/2018   Anginal pain (HCC)    Anxiety    Aortic atherosclerosis (HCC)    Coronary artery calcification seen on CT scan    Depression    Diastolic dysfunction    a.) TTE 04/24/2020: EF 60-65%, no RWMAs, mild LVH, G2DD, mild LAE, mild MR   ED (erectile dysfunction)    a.) on PDE5i (tadalafil)   History of adenomatous polyp of colon    History of bilateral cataract extraction 2019   History of exercise stress test 04/17/2007   a.) ETT 04/17/2007: exercised 8:15 on Bruce protocol; max HR 155 bpm; no ECG changes (rest/stress) concerning for ischemia -- negative study   Humeral surgical neck fracture (LEFT) 01/21/2023   a.) s/p mechanical fall (lost balance); radiographs (+) for comminuted impacted LEFT humeral neck fracture   Hyperlipidemia    LDL in 120's   Hypertension    Intraductal papilloma of breast, left    a.) s/p ductal system excision 11/26/2021   Long term current use of aspirin    Near syncope    OSA on CPAP    study in epic 08-11-2017 moderate osa   Osteopenia    Seasonal allergic rhinitis    Type 2 diabetes mellitus (HCC)    Wears glasses    Wears hearing aid in both ears     Assessment/Plan: 1 Day Post-Op Procedure(s) (LRB): Left  reverse shoulder arthroplasty, biceps tenodesis (Left) Left reverse shoulder arthroplasty, biceps tenodesis (Left) Principal Problem:   Closed 4-part fracture of proximal humerus  Estimated body mass index is 35.44 kg/m as calculated from the following:   Height as of this encounter: 5\' 9"  (1.753 m).   Weight as of this encounter: 108.9 kg. Advance diet Up with therapy D/C IV fluids when tolerating po intake.  Labs and vitals reviewed.  BP 113/60 Hg 12.3 this morning.   Hemovac removed this AM without issue. Up with PT today, he is already getting up and using the bathroom without issue. Plan for discharge home today after completion of PT.  DVT Prophylaxis - Aspirin and TED  hose Non-weightbearing to the left arm.  Valeria Batman, PA-C [facility] 01/28/2023, 7:49 AM

## 2023-01-28 NOTE — Discharge Summary (Signed)
Physician Discharge Summary  Patient ID: James Camacho MRN: [id number] DOB/AGE: [birth year] 72 y.o.  Admit date: 01/27/2023 Discharge date: 01/28/2023  Admission Diagnoses:  Closed 4-part fracture of proximal humerus [S42.293A] S/P left reverse total shoulder replacement.  Discharge Diagnoses: Patient Active Problem List   Diagnosis Date Noted   Closed 4-part fracture of proximal humerus 01/27/2023   Morbid obesity (HCC) 01/12/2023   Infected sebaceous cyst 05/31/2022   Hypertension 11/24/2021   GERD (gastroesophageal reflux disease) 02/20/2019   OSA (obstructive sleep apnea)    Advance directive discussed with patient 06/20/2017   Type 2 diabetes mellitus with other circulatory complications (HCC) 01/04/2017   Dyslipidemia    Routine general medical examination at a health care facility 01/11/2011   Sleep disturbance 07/06/2010   Mood disorder (HCC) 07/06/2007   Essential hypertension, benign 07/06/2007   ERECTILE DYSFUNCTION, 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    Past Medical History:  Diagnosis Date   Anal condylomata    a.) s/p laser ablation 12/20/2018   Anginal pain (HCC)    Anxiety    Aortic atherosclerosis (HCC)    Coronary artery calcification seen on CT scan    Depression    Diastolic dysfunction    a.) TTE 04/24/2020: EF 60-65%, no RWMAs, mild LVH, G2DD, mild LAE, mild MR   ED (erectile dysfunction)    a.) on PDE5i (tadalafil)   History of adenomatous polyp of colon    History of bilateral cataract extraction 2019   History of exercise stress test 04/17/2007   a.) ETT 04/17/2007: exercised 8:15 on Bruce protocol; max HR 155 bpm; no ECG changes (rest/stress) concerning for ischemia -- negative study   Humeral surgical neck fracture (LEFT) 01/21/2023   a.) s/p mechanical fall (lost balance); radiographs (+) for comminuted impacted LEFT humeral neck fracture   Hyperlipidemia    LDL in 120's   Hypertension    Intraductal papilloma of breast, left    a.) s/p ductal  system excision 11/26/2021   Long term current use of aspirin    Near syncope    OSA on CPAP    study in epic 08-11-2017 moderate osa   Osteopenia    Seasonal allergic rhinitis    Type 2 diabetes mellitus (HCC)    Wears glasses    Wears hearing aid in both ears      Transfusion: None.   Consultants (if any):   Discharged Condition: Improved  Hospital Course: Sauel Robnett is an 72 y.o. male who was admitted 01/27/2023 with a diagnosis of a left 4-part promial humerus fracture and went to the operating room on 01/27/2023 and underwent the above named procedures.    Surgeries: Procedure(s): Left reverse shoulder arthroplasty, biceps tenodesis Left reverse shoulder arthroplasty, biceps tenodesis on 01/27/2023 Patient tolerated the surgery well. Taken to PACU where he was stabilized and then transferred to the orthopedic floor.  Started on Aspirin 325mg  every 24 hrs. Heels elevated on bed with rolled towels. No evidence of DVT. Negative Homan. Physical therapy started on day #1 for gait training and transfer. OT started day #1 for ADL and assisted devices.  Patient's IV was removed on POD1.  Implants: S&N [employer] 10 deg Augmented baseplate w/central + 3 peripheral 4.29mm screws, 38mm concentric glenosphere; Titan Reverse Body Standard and Body Screw; TSS Press Fit Size 11 stem; +3 Standard Poly Liner   He was given perioperative antibiotics:  Anti-infectives (From admission, onward)    Start     Dose/Rate Route Frequency Ordered Stop   01/27/23 1800  ceFAZolin (ANCEF) IVPB 2g/100 mL premix        2 g 200 mL/hr over 30 Minutes Intravenous Every 6 hours 01/27/23 1624 01/28/23 0611   01/27/23 1227  vancomycin (VANCOCIN) powder  Status:  Discontinued          As needed 01/27/23 1227 01/27/23 1431   01/27/23 0600  ceFAZolin (ANCEF) IVPB 2g/100 mL premix        2 g 200 mL/hr over 30 Minutes Intravenous On call to O.R. 01/26/23 2235 01/27/23 1207     .  He was given sequential  compression devices, early ambulation, and Aspirin for DVT prophylaxis.  He benefited maximally from the hospital stay and there were no complications.    Recent vital signs:  Vitals:   01/27/23 1639 01/28/23 0737  BP: (!) 190/79 113/60  Pulse: 81 93  Resp: 18 17  Temp: 98.7 F (37.1 C) 97.9 F (36.6 C)  SpO2: 92% 95%    Recent laboratory studies:  Lab Results  Component Value Date   HGB 12.3 (L) 01/28/2023   HGB 14.0 01/25/2023   HGB 15.8 11/28/2022   Lab Results  Component Value Date   WBC 10.0 01/28/2023   PLT 206 01/28/2023   No results found for: "INR" Lab Results  Component Value Date   NA 134 (L) 01/28/2023   K 3.9 01/28/2023   CL 102 01/28/2023   CO2 26 01/28/2023   BUN 10 01/28/2023   CREATININE 0.72 01/28/2023   GLUCOSE 185 (H) 01/28/2023    Discharge Medications:   Allergies as of 01/28/2023       Reactions   Aleve [naproxen Sodium] Hives   Ibuprofen Hives   Shrimp (diagnostic) Other (See Comments)   Itching and dry mouth   Shrimp Extract Other (See Comments)   Itching and dry mouth   Other    Pt can only take Tylenol for pain- anything prescribed that he has tried breaks him out in a rash.    Penicillin G Rash   Penicillins Rash        Medication List     STOP taking these medications    Acular 0.5 % ophthalmic solution Generic drug: ketorolac   amLODipine 10 MG tablet Commonly known as: NORVASC   atorvastatin 10 MG tablet Commonly known as: LIPITOR   carvedilol 12.5 MG tablet Commonly known as: COREG   citalopram 40 MG tablet Commonly known as: CELEXA   Farxiga 5 MG Tabs tablet Generic drug: dapagliflozin propanediol   fluticasone 50 MCG/ACT nasal spray Commonly known as: FLONASE   furosemide 40 MG tablet Commonly known as: LASIX   lisinopril 40 MG tablet Commonly known as: ZESTRIL   metoprolol tartrate 100 MG tablet Commonly known as: LOPRESSOR   Mounjaro 10 MG/0.5ML Pen Generic drug: tirzepatide    omeprazole 20 MG capsule Commonly known as: PRILOSEC   Ozempic (1 MG/DOSE) 4 MG/3ML Sopn Generic drug: Semaglutide (1 MG/DOSE)   Pen Needles 32G X 6 MM Misc   prednisoLONE acetate 1 % ophthalmic suspension Commonly known as: PRED FORTE   tadalafil 20 MG tablet Commonly known as: CIALIS   tirzepatide 5 MG/0.5ML Pen Commonly known as: MOUNJARO       TAKE these medications    aspirin EC 325 MG tablet Take 1 tablet (325 mg total) by mouth daily. What changed:  medication strength how much to take when to take this   ondansetron 4 MG disintegrating tablet Commonly known as: ZOFRAN-ODT Take 1 tablet (4 mg  total) by mouth every 6 (six) hours as needed for nausea or vomiting.   oxyCODONE 5 MG immediate release tablet Commonly known as: Oxy IR/ROXICODONE Take 1-2 tablets (5-10 mg total) by mouth every 4 (four) hours as needed for severe pain (pain score 7-10). What changed:  how much to take when to take this reasons to take this   traMADol 50 MG tablet Commonly known as: Ultram Take 1 tablet (50 mg total) by mouth every 6 (six) hours as needed.        Diagnostic Studies: DG Shoulder Left Port  Result Date: 01/27/2023 CLINICAL DATA:  Status post shoulder replacement. EXAM: LEFT SHOULDER COMPARISON:  Preoperative imaging. FINDINGS: Reverse left shoulder arthroplasty in expected alignment. No periprosthetic lucency or fracture. Recent postsurgical change includes air and edema in the joint space and soft tissues. Overlying skin staples in place. IMPRESSION: Reverse left shoulder arthroplasty without immediate postoperative complication. Electronically Signed   By: Narda Rutherford M.D.   On: 01/27/2023 18:27   [place] OR NERVE BLOCK-IMAGE ONLY [facility])  Result Date: 01/27/2023 There is no interpretation for this exam.  This order is for images obtained during a surgical procedure.  Please See "Surgeries" Tab for more information regarding the procedure.   CT Shoulder Left  Wo Contrast  Result Date: 01/21/2023 CLINICAL DATA:  Tripped and fell today sustaining a left shoulder fracture. EXAM: CT OF THE LEFT SHOULDER WITHOUT CONTRAST TECHNIQUE: Multidetector CT imaging of the left shoulder was performed according to the standard protocol. RADIATION DOSE REDUCTION: This exam was performed according to the departmental dose-optimization program which includes automated exposure control, adjustment of the mA and/or kV according to patient size and/or use of iterative reconstruction technique. COMPARISON:  Left shoulder series from earlier today. FINDINGS: Bones/Joint/Cartilage Mild osteopenia. There is acute transverse oblique surgical neck proximal left humeral fracture with moderate impaction, distal fragment anterior translation up to 1/2 of a shaft width and a few mm of lateral translation of the distal fragment, and slight distal fragment posterior angulation. There is separate longitudinal fracture through the greater tuberosity with slight comminution and dorsal distraction. There is a tiny nondisplaced chip fracture of the inferior articular surface of the glenoid process. There is no dislocation including the AC joint. There is narrowing and circumferential spurring at the [facility] joint. Calcification in the superior AC joint space. There small left glenohumeral joint hemarthrosis. There are no further fractures. No primary pathologic process apart from the osteopenia. A bone island is incidentally noted in the glenoid neck and distal left clavicle. 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 Visualized left ribs are intact. Ligaments Suboptimally assessed by CT. Muscles and Tendons Normal muscle bulk for age. No intramuscular hematoma is evident. Regional tendons are poorly evaluated with this technique. Based on the coronal reformatting there is probably at least some degree of impingement of the supraspinatus tendon between the acromial tip and the humeral head but  no evidence of a through and through tear. The other area tendons are probably intact as well as can be seen, but partial tendon tears are not evaluated for with CT. Soft tissues Subcutaneous stranding anterior left upper arm, likely posttraumatic. No axillary adenopathy. The visualized left lung is clear. No pleural effusion. No vascular calcifications are seen. IMPRESSION: 1. Acute transverse oblique surgical neck proximal left humeral fracture with moderate impaction, anterior translation and slight posterior angulation of the distal fragment. 2. Separate longitudinal fracture through the greater tuberosity with slight comminution and dorsal distraction.  3. Tiny nondisplaced chip fracture of the inferior articular surface of the glenoid. 4. No dislocation. 5. Osteopenia and degenerative change. 6. Probable at least some degree of impingement of the supraspinatus tendon between the acromial tip and the humeral head but no evidence of a through and through tear. 7. Small glenohumeral joint hemarthrosis. 8. Subcutaneous stranding anterior left upper arm, likely posttraumatic. Electronically Signed   By: Almira Bar M.D.   On: 01/21/2023 23:39   DG Shoulder Left  Result Date: 01/21/2023 CLINICAL DATA:  Post injury. EXAM: LEFT SHOULDER - 2+ VIEW COMPARISON:  None Available. FINDINGS: Comminuted left humeral neck fracture with impaction. There is no evidence of dislocation. Visualized lung parenchyma is clear. IMPRESSION: Comminuted impacted left humeral neck fracture. Electronically Signed   By: Ted Mcalpine M.D.   On: 01/21/2023 20:15    Disposition: Plan for discharge home pending completion of PT.     Follow-up Information     Signa Kell, MD Follow up in 14 day(s).   Specialty: Orthopedic Surgery Why: Staple Removal. Contact information: [address] [place] [address] [phone]                  Signed: Meriel Pica PA-C 01/28/2023, 8:00 AM

## 2023-01-28 NOTE — Evaluation (Signed)
Physical Therapy Evaluation Patient Details Name: James Camacho MRN: [id number] DOB: 1950-05-14 Today's Date: 01/28/2023  History of Present Illness  Pt admitted to [facility] on 01/27/23 under observation for L rTSA and L bicep tenodesis to address closed 4-part fracture of proximal humerus. Significant PMH includes: HTN, dyslipidemia, , T2DM, OSA, GERD, and obesity.   Clinical Impression  Pt is a 72 year old M admitted to hospital on 01/27/23 for rTSA. At baseline, pt is IND with ADL's, IADL's, ambulation without AD, driving, and working as a Editor, commissioning. Job duties include heavy lifting from floor to stretcher with multi-person assist.   Pt presents with decreased LUE strength, AROM, and L thumb N/T due to acuity of sx; otherwise pt with adequate strength, balance, endurance, and safety required for safe functional mobility. Due to deficits, pt required mod assist for bed mobility due to LUE immobilization, but was able to be sup-IND for transfers and to walk 115ft without AD. Supervision for safety with stair training, demonstrating reciprocal gait pattern and good balance. Pt educated to sleep in recliner at home due to difficulty navigating bed mobility with LUE restrictions.   All pt needs can be met at next venue of care. He is scheduled for OPPT eval 11/27. No DME needs identified at this time. Pt appropriate for IND ambulation in room and hallway while admitted to hospital; pt verbalized understanding. PT to sign off.          If plan is discharge home, recommend the following: A little help with bathing/dressing/bathroom;Assistance with cooking/housework     Equipment Recommendations None recommended by PT     Functional Status Assessment Patient has had a recent decline in their functional status and/or demonstrates limited ability to make significant improvements in function in a reasonable and predictable amount of time     Precautions / Restrictions  Precautions Precautions: Shoulder Type of Shoulder Precautions: rTSA; NWB LUE, no A/PROM to shoulder; elbow/wrist/hand AROM ok Shoulder Interventions: Shoulder abduction pillow;At all times;Off for dressing/bathing/exercises;Shoulder sling/immobilizer Restrictions Weight Bearing Restrictions: Yes LUE Weight Bearing: Non weight bearing      Mobility  Bed Mobility               General bed mobility comments: mod assist for trunk facilitation to sit EOB due to LUE immobilization.    Transfers                   General transfer comment: IND to stand from EOB and sit in recliner with use of RUE for support, no AD    Ambulation/Gait Ambulation/Gait assistance: Supervision Gait Distance (Feet): 140 Feet (94ft x2) Assistive device: None         General Gait Details: Supervision for safety to ambulate to stairwell and back without AD, demonstrates reciprocal gait pattern without obvious gait deficits  Stairs Stairs: Yes Stairs assistance: Supervision   Number of Stairs: 5 General stair comments: Supervision for safety to navigate 5 steps with UUE support on wall/railing for stability, and reciprocal gait pattern.     Balance Overall balance assessment: No apparent balance deficits (not formally assessed)                                           Pertinent Vitals/Pain Pain Assessment Pain Assessment: 0-10 Pain Score: 3  Pain Location: L shoulder Pain Intervention(s): Monitored during session, Premedicated before session, Repositioned, Ice  applied    Home Living Family/patient expects to be discharged to:: Private residence Living Arrangements: Spouse/significant other (has dementia) Available Help at Discharge: Family;Friend(s);Available PRN/intermittently (x2 daughters and work friends, able to provide PRN care. Spouse unable to provide any sup/assist due to dementia) Type of Home: House Home Access: Stairs to enter Entrance  Stairs-Rails: None (wall on R side) Entrance Stairs-Number of Steps: 3 Alternate Level Stairs-Number of Steps: 20 (10+platform+10) Home Layout: Two level (bedroom on 2nd level; able to stay on main level in recliner) Home Equipment: Shower seat - built in;Grab bars - tub/shower;Transport chair;Hand held shower head Additional Comments: suction cup grab bar    Prior Function Prior Level of Function : Independent/Modified Independent;Driving;Working/employed;History of Falls (last six months)             Mobility Comments: IND ambulation without AD. 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 Tripped over clothes resulting in humeral fracture. Otherwise no hx of falls. ADLs Comments: IND with ADL's, IADL's, medication management     Extremity/Trunk Assessment   Upper Extremity Assessment Upper Extremity Assessment: Defer to OT evaluation (LUE immobilized in sling ABD pillow; c/o N/T in L thumb likely due to nerve block)    Lower Extremity Assessment Lower Extremity Assessment: Overall WFL for tasks assessed          Cognition Arousal: Alert Behavior During Therapy: WFL for tasks assessed/performed Overall Cognitive Status: Within Functional Limits for tasks assessed                                 General Comments: follows multi-step commands        General Comments General comments (skin integrity, edema, etc.): bruising to LUE. Assisted with sling adjustment in sitting    Exercises Other Exercises Other Exercises: Participates in bed mobility, transfers, gait, and stair training with supervision-IND for safety. Other Exercises: Pt educated re: PT role/POC, DC recommendations, safety with functional mobility, LUE WB precautions, pain management, LUE ROM restrictions, sling ABD management and wear time, ice pack management and wear time. He verbalized understanding.   Assessment/Plan     PT Assessment All further PT needs can be met in the next venue of care  PT Problem List Decreased strength;Decreased range of motion;Pain           PT Goals (Current goals can be found in the Care Plan section)  Acute Rehab PT Goals Patient Stated Goal: "get back to working and cooking" PT Goal Formulation: With patient Time For Goal Achievement: 02/11/23 Potential to Achieve Goals: Good    Frequency  Eval only        AM-PAC PT "6 Clicks" Mobility  Outcome Measure Help needed turning from your back to your side while in a flat bed without using bedrails?: None Help needed moving from lying on your back to sitting on the side of a flat bed without using bedrails?: None Help needed moving to and from a bed to a chair (including a wheelchair)?: None Help needed standing up from a chair using your arms (e.g., wheelchair or bedside chair)?: None Help needed to walk in hospital room?: None Help needed climbing 3-5 steps with a railing? : A Little 6 Click Score: 23    End of Session Equipment Utilized During Treatment: Gait belt (LUE sling ABD pillow) Activity Tolerance: Patient tolerated treatment well Patient left: in chair (no chair alarm  warranted; pt safe for IND ambulation in room and hallway) Nurse Communication: Mobility status      Time: 2952-8413 PT Time Calculation (min) (ACUTE ONLY): 33 min   Charges:   PT Evaluation $PT Eval Low Complexity: 1 Low PT Treatments $Therapeutic Activity: 8-22 mins PT General Charges $$ ACUTE PT VISIT: 1 Visit         Vira Blanco, PT, DPT 9:05 AM,01/28/23 Physical Therapist - [place] [facility]

## 2023-01-28 NOTE — TOC CM/SW Note (Signed)
Notified by PT and OT that patient will follow up with already scheduled OPPT visit next week, no DME needs.  Alfonso Ramus, LCSW Transitions of Care Department [phone]

## 2023-01-30 ENCOUNTER — Encounter: Payer: Self-pay | Admitting: Orthopedic Surgery

## 2023-02-01 DIAGNOSIS — M25512 Pain in left shoulder: Secondary | ICD-10-CM | POA: Diagnosis not present

## 2023-02-01 DIAGNOSIS — M6281 Muscle weakness (generalized): Secondary | ICD-10-CM | POA: Diagnosis not present

## 2023-02-01 DIAGNOSIS — G8929 Other chronic pain: Secondary | ICD-10-CM | POA: Diagnosis not present

## 2023-02-01 DIAGNOSIS — M25612 Stiffness of left shoulder, not elsewhere classified: Secondary | ICD-10-CM | POA: Diagnosis not present

## 2023-02-01 DIAGNOSIS — Z96612 Presence of left artificial shoulder joint: Secondary | ICD-10-CM | POA: Diagnosis not present

## 2023-02-01 NOTE — Anesthesia Postprocedure Evaluation (Signed)
Anesthesia Post Note  Patient: James Camacho  Procedure(s) Performed: Left reverse shoulder arthroplasty, biceps tenodesis (Left: Shoulder) Left reverse shoulder arthroplasty, biceps tenodesis (Left: Shoulder)  Patient location during evaluation: PACU Anesthesia Type: General Level of consciousness: awake and awake and alert Pain management: satisfactory to patient Vital Signs Assessment: post-procedure vital signs reviewed and stable Cardiovascular status: stable Anesthetic complications: yes   Encounter Notable Events  Notable Event Outcome Phase Comment  Difficult to intubate - expected  Intraprocedure Filed from anesthesia note documentation.     Last Vitals:  Vitals:   01/27/23 1639 01/28/23 0737  BP: (!) 190/79 113/60  Pulse: 81 93  Resp: 18 17  Temp: 37.1 C 36.6 C  SpO2: 92% 95%    Last Pain:  Vitals:   01/28/23 0818  TempSrc:   PainSc: 6                  VAN STAVEREN,[name]

## 2023-02-08 DIAGNOSIS — Z96612 Presence of left artificial shoulder joint: Secondary | ICD-10-CM | POA: Diagnosis not present

## 2023-02-10 ENCOUNTER — Other Ambulatory Visit: Payer: Self-pay | Admitting: Internal Medicine

## 2023-02-10 NOTE — Telephone Encounter (Signed)
Called to speak to pt about the refill because it was removed from his med list when he went to the ER 01-28-23. He stated the orthopedic surgeon wanted him to stop it. He wants to go back on it now, but needs a refill. Will forward to Dr Alphonsus Sias to approve.

## 2023-02-13 DIAGNOSIS — M25512 Pain in left shoulder: Secondary | ICD-10-CM | POA: Diagnosis not present

## 2023-02-13 DIAGNOSIS — M6281 Muscle weakness (generalized): Secondary | ICD-10-CM | POA: Diagnosis not present

## 2023-02-13 DIAGNOSIS — M25612 Stiffness of left shoulder, not elsewhere classified: Secondary | ICD-10-CM | POA: Diagnosis not present

## 2023-02-13 DIAGNOSIS — G8929 Other chronic pain: Secondary | ICD-10-CM | POA: Diagnosis not present

## 2023-02-13 DIAGNOSIS — Z96612 Presence of left artificial shoulder joint: Secondary | ICD-10-CM | POA: Diagnosis not present

## 2023-02-14 DIAGNOSIS — H35351 Cystoid macular degeneration, right eye: Secondary | ICD-10-CM | POA: Diagnosis not present

## 2023-02-17 ENCOUNTER — Other Ambulatory Visit: Payer: Self-pay | Admitting: Internal Medicine

## 2023-02-21 DIAGNOSIS — Z96612 Presence of left artificial shoulder joint: Secondary | ICD-10-CM | POA: Diagnosis not present

## 2023-02-21 DIAGNOSIS — M25612 Stiffness of left shoulder, not elsewhere classified: Secondary | ICD-10-CM | POA: Diagnosis not present

## 2023-03-09 ENCOUNTER — Encounter: Payer: Self-pay | Admitting: Internal Medicine

## 2023-03-10 ENCOUNTER — Telehealth: Payer: Self-pay

## 2023-03-10 ENCOUNTER — Other Ambulatory Visit (HOSPITAL_COMMUNITY): Payer: Self-pay

## 2023-03-10 NOTE — Telephone Encounter (Signed)
 Pharmacy Patient Advocate Encounter  Received notification from [employer] that Prior Authorization for Ozempic  (2 MG/DOSE) 8MG /3ML pen-injectors has been APPROVED from 03/10/23 to 03/09/24. Ran test claim, Copay is $0.00. This test claim was processed through [facility]- copay amounts may vary at other pharmacies due to pharmacy/plan contracts, or as the patient moves through the different stages of their insurance plan.   PA #/Case ID/Reference #: [id number]

## 2023-03-15 DIAGNOSIS — Z96612 Presence of left artificial shoulder joint: Secondary | ICD-10-CM | POA: Diagnosis not present

## 2023-03-16 ENCOUNTER — Encounter: Payer: Self-pay | Admitting: Internal Medicine

## 2023-03-27 ENCOUNTER — Encounter: Payer: Self-pay | Admitting: Internal Medicine

## 2023-03-27 DIAGNOSIS — H35351 Cystoid macular degeneration, right eye: Secondary | ICD-10-CM | POA: Diagnosis not present

## 2023-03-27 MED ORDER — CITALOPRAM HYDROBROMIDE 20 MG PO TABS: 20.0000 mg | ORAL_TABLET | Freq: Every day | ORAL | 3 refills | Status: DC

## 2023-04-05 ENCOUNTER — Encounter: Payer: Self-pay | Admitting: Internal Medicine

## 2023-04-06 ENCOUNTER — Encounter: Payer: Self-pay | Admitting: Internal Medicine

## 2023-04-06 ENCOUNTER — Ambulatory Visit: Payer: PPO | Admitting: Internal Medicine

## 2023-04-06 VITALS — BP 158/86 | HR 75 | Temp 97.8°F | Ht 68.0 in | Wt 234.0 lb

## 2023-04-06 DIAGNOSIS — I10 Essential (primary) hypertension: Secondary | ICD-10-CM | POA: Diagnosis not present

## 2023-04-06 MED ORDER — ASPIRIN 81 MG PO TBEC: 81.0000 mg | DELAYED_RELEASE_TABLET | Freq: Every day | ORAL | 0 refills | Status: AC

## 2023-04-06 MED ORDER — LISINOPRIL 40 MG PO TABS: 40.0000 mg | ORAL_TABLET | Freq: Every day | ORAL | 3 refills | Status: DC

## 2023-04-06 MED ORDER — FUROSEMIDE 40 MG PO TABS: 40.0000 mg | ORAL_TABLET | Freq: Every day | ORAL | 3 refills | Status: AC

## 2023-04-06 MED ORDER — CARVEDILOL 12.5 MG PO TABS: 12.5000 mg | ORAL_TABLET | Freq: Two times a day (BID) | ORAL | 3 refills | Status: AC

## 2023-04-06 MED ORDER — OMEPRAZOLE 20 MG PO CPDR: 20.0000 mg | DELAYED_RELEASE_CAPSULE | Freq: Every day | ORAL | 3 refills | Status: AC

## 2023-04-06 MED ORDER — DAPAGLIFLOZIN PROPANEDIOL 5 MG PO TABS: 5.0000 mg | ORAL_TABLET | Freq: Every day | ORAL | 3 refills | Status: DC

## 2023-04-06 MED ORDER — AMLODIPINE BESYLATE 10 MG PO TABS: 10.0000 mg | ORAL_TABLET | Freq: Every day | ORAL | 3 refills | Status: AC

## 2023-04-06 NOTE — Telephone Encounter (Signed)
Spoke to pt. Made OV today at 12pm.

## 2023-04-06 NOTE — Assessment & Plan Note (Signed)
BP Readings from Last 3 Encounters:  04/06/23 (!) 158/86  01/28/23 113/60  01/22/23 (!) 156/87   Thinks he is on the amlodipine 10 and carvedilol 12.5 bid--but doesn't think he is taking the lisinopril 40 Will refill all these meds and he will monitor

## 2023-04-06 NOTE — Progress Notes (Signed)
Subjective:    Patient ID: James Camacho, male    DOB: [birth year], 73 y.o.   MRN: [id number]  HPI Here due to blood pressure elevation  Having headache all the time Was as high as 197/91 last night Carvedilol twice a day On the amlodipine and took an extra last night He isn't sure if he is still on the lisinopril  No chest pain or SOB  Reviewed hospital records from surgery in November--didn't seem that meds were taken off his list (but many were off)  Current Outpatient Medications on File Prior to Visit  Medication Sig Dispense Refill   aspirin EC 325 MG tablet Take 1 tablet (325 mg total) by mouth daily. 30 tablet 0   citalopram (CELEXA) 20 MG tablet Take 1 tablet (20 mg total) by mouth daily. 90 tablet 3   ondansetron (ZOFRAN-ODT) 4 MG disintegrating tablet Take 1 tablet (4 mg total) by mouth every 6 (six) hours as needed for nausea or vomiting. 30 tablet 0   oxyCODONE (OXY IR/ROXICODONE) 5 MG immediate release tablet Take 1-2 tablets (5-10 mg total) by mouth every 4 (four) hours as needed for severe pain (pain score 7-10). 40 tablet 0   tirzepatide (MOUNJARO) 5 MG/0.5ML Pen INJECT 5 MG SUBCUTANEOUSLY WEEKLY 2 mL 5   traMADol (ULTRAM) 50 MG tablet Take 1 tablet (50 mg total) by mouth every 6 (six) hours as needed. 30 tablet 0   No current facility-administered medications on file prior to visit.    Allergies  Allergen Reactions   Aleve [Naproxen Sodium] Hives   Ibuprofen Hives   Shrimp (Diagnostic) Other (See Comments)    Itching and dry mouth   Shrimp Extract Other (See Comments)    Itching and dry mouth   Other     Pt can only take Tylenol for pain- anything prescribed that he has tried breaks him out in a rash.    Penicillin G Rash   Penicillins Rash    Past Medical History:  Diagnosis Date   Anal condylomata    a.) s/p laser ablation 12/20/2018   Anginal pain (HCC)    Anxiety    Aortic atherosclerosis (HCC)    Coronary artery calcification seen on CT  scan    Depression    Diastolic dysfunction    a.) TTE 04/24/2020: EF 60-65%, no RWMAs, mild LVH, G2DD, mild LAE, mild MR   ED (erectile dysfunction)    a.) on PDE5i (tadalafil)   History of adenomatous polyp of colon    History of bilateral cataract extraction 2019   History of exercise stress test 04/17/2007   a.) ETT 04/17/2007: exercised 8:15 on Bruce protocol; max HR 155 bpm; no ECG changes (rest/stress) concerning for ischemia -- negative study   Humeral surgical neck fracture (LEFT) 01/21/2023   a.) s/p mechanical fall (lost balance); radiographs (+) for comminuted impacted LEFT humeral neck fracture   Hyperlipidemia    LDL in 120's   Hypertension    Intraductal papilloma of breast, left    a.) s/p ductal system excision 11/26/2021   Long term current use of aspirin    Near syncope    OSA on CPAP    study in epic 08-11-2017 moderate osa   Osteopenia    Seasonal allergic rhinitis    Type 2 diabetes mellitus (HCC)    Wears glasses    Wears hearing aid in both ears     Past Surgical History:  Procedure Laterality Date   BICEPT TENODESIS Left 01/27/2023  Procedure: Left reverse shoulder arthroplasty, biceps tenodesis;  Surgeon: Signa Kell, MD;  Location: [facility];  Service: Orthopedics;  Laterality: Left;   BREAST DUCTAL SYSTEM EXCISION Left 11/26/2021   Procedure: EXCISION DUCTAL SYSTEM BREAST;  Surgeon: Earline Mayotte, MD;  Location: [facility];  Service: General;  Laterality: Left;   CATARACT EXTRACTION W/PHACO Right 06/28/2017   Procedure: CATARACT EXTRACTION PHACO AND INTRAOCULAR LENS PLACEMENT (IOC) RIGHT;  Surgeon: Lockie Mola, MD;  Location: [facility];  Service: Ophthalmology;  Laterality: Right;   CATARACT EXTRACTION W/PHACO Left 07/19/2017   Procedure: CATARACT EXTRACTION PHACO AND INTRAOCULAR LENS PLACEMENT (IOC) LEFT TOPICAL;  Surgeon: Lockie Mola, MD;  Location: [facility];  Service: Ophthalmology;  Laterality: Left;  IVA  TOPICALLEFTDIABETIC   CHOLECYSTECTOMY OPEN  1980s   dr  Okey Dupre   AND APPENDECTOMY   COLONOSCOPY WITH PROPOFOL  last one 03-28-2018   dr pyrtle   Corneal replacements Bilateral    12/23 and 2/24   LASER ABLATION CONDOLAMATA N/A 12/20/2018   Procedure: LASER ABLATION OF CONDOLAMATA, EXAMINATION UNDER ANESTHESIA;  Surgeon: Karie Soda, MD;  Location: [facility];  Service: General;  Laterality: N/A;   REVERSE SHOULDER ARTHROPLASTY Left 01/27/2023   Procedure: Left reverse shoulder arthroplasty, biceps tenodesis;  Surgeon: Signa Kell, MD;  Location: [facility];  Service: Orthopedics;  Laterality: Left;    Family History  Problem Relation Age of Onset   Diabetes Mother    Arthritis Mother    Hypertension Mother    Chronic Renal Failure Mother    Parkinsonism Father    Heart disease Father    Healthy Sister    Healthy Sister    Healthy Brother    Cancer Neg Hx        no colon or prostate cancer   Colon cancer Neg Hx    Esophageal cancer Neg Hx    Stomach cancer Neg Hx    Rectal cancer Neg Hx    Breast cancer Neg Hx     Social History   Socioeconomic History   Marital status: Married    Spouse name: Not on file   Number of children: 3   Years of education: Not on file   Highest education level: Associate degree: academic program  Occupational History   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   Occupation: Futures trader business    Comment:    Tobacco Use   Smoking status: Never    Passive exposure: Never   Smokeless tobacco: Never  Vaping Use   Vaping status: Never Used  Substance and Sexual Activity   Alcohol use: Not Currently    Comment: rare   Drug use: Never   Sexual activity: Not on file  Other Topics Concern   Not on file  Social History Narrative   Has living will   2 daughters should now be his health care POA   Would accept resuscitation   No prolonged tube feeds   Social  Drivers of Health   Financial Resource Strain: Low Risk  (05/30/2022)   Overall Financial Resource Strain (CARDIA)    Difficulty of Paying Living Expenses: Not hard at all  Food Insecurity: No Food Insecurity (01/27/2023)   Hunger Vital Sign    Worried About Running Out of Food in the Last Year: Never true    Ran Out of Food in the Last Year: Never true  Transportation Needs: No Transportation Needs (01/27/2023)  PRAPARE - Administrator, Civil Service (Medical): No    Lack of Transportation (Non-Medical): No  Physical Activity: Insufficiently Active (05/30/2022)   Exercise Vital Sign    Days of Exercise per Week: 4 days    Minutes of Exercise per Session: 30 min  Stress: Stress Concern Present (05/30/2022)   [employer] of Occupational Health - Occupational Stress Questionnaire    Feeling of Stress : To some extent  Social Connections: Socially Integrated (05/30/2022)   Social Connection and Isolation Panel [NHANES]    Frequency of Communication with Friends and Family: More than three times a week    Frequency of Social Gatherings with Friends and Family: Twice a week    Attends Religious Services: More than 4 times per year    Active Member of [employer] or Organizations: Yes    Attends Engineer, structural: More than 4 times per year    Marital Status: Married  Catering manager Violence: Not At Risk (01/27/2023)   Humiliation, Afraid, Rape, and Kick questionnaire    Fear of Current or Ex-Partner: No    Emotionally Abused: No    Physically Abused: No    Sexually Abused: No   Review of Systems Still with anxiety---dealing with wife with dementia Awakens with anxiety--and then has it at night also Didn't fill the mounjaro this month--too expensive     Objective:   Physical Exam Constitutional:      Appearance: Normal appearance.  Cardiovascular:     Rate and Rhythm: Normal rate and regular rhythm.     Heart sounds: No murmur heard.    No gallop.   Pulmonary:     Effort: Pulmonary effort is normal.     Breath sounds: Normal breath sounds. No wheezing or rales.  Musculoskeletal:     Cervical back: Neck supple.     Right lower leg: No edema.     Left lower leg: No edema.  Lymphadenopathy:     Cervical: No cervical adenopathy.  Neurological:     Mental Status: He is alert.            Assessment & Plan:

## 2023-04-10 DIAGNOSIS — Z96612 Presence of left artificial shoulder joint: Secondary | ICD-10-CM | POA: Diagnosis not present

## 2023-04-10 DIAGNOSIS — M25512 Pain in left shoulder: Secondary | ICD-10-CM | POA: Diagnosis not present

## 2023-04-17 DIAGNOSIS — M25512 Pain in left shoulder: Secondary | ICD-10-CM | POA: Diagnosis not present

## 2023-04-17 DIAGNOSIS — Z96612 Presence of left artificial shoulder joint: Secondary | ICD-10-CM | POA: Diagnosis not present

## 2023-04-20 DIAGNOSIS — Z96612 Presence of left artificial shoulder joint: Secondary | ICD-10-CM | POA: Diagnosis not present

## 2023-04-24 DIAGNOSIS — Z96612 Presence of left artificial shoulder joint: Secondary | ICD-10-CM | POA: Diagnosis not present

## 2023-04-24 DIAGNOSIS — M25612 Stiffness of left shoulder, not elsewhere classified: Secondary | ICD-10-CM | POA: Diagnosis not present

## 2023-05-01 DIAGNOSIS — M25512 Pain in left shoulder: Secondary | ICD-10-CM | POA: Diagnosis not present

## 2023-05-01 DIAGNOSIS — Z96612 Presence of left artificial shoulder joint: Secondary | ICD-10-CM | POA: Diagnosis not present

## 2023-05-04 DIAGNOSIS — M6281 Muscle weakness (generalized): Secondary | ICD-10-CM | POA: Diagnosis not present

## 2023-05-04 DIAGNOSIS — Z96612 Presence of left artificial shoulder joint: Secondary | ICD-10-CM | POA: Diagnosis not present

## 2023-05-08 DIAGNOSIS — H35351 Cystoid macular degeneration, right eye: Secondary | ICD-10-CM | POA: Diagnosis not present

## 2023-05-09 DIAGNOSIS — Z96612 Presence of left artificial shoulder joint: Secondary | ICD-10-CM | POA: Diagnosis not present

## 2023-05-09 DIAGNOSIS — M6281 Muscle weakness (generalized): Secondary | ICD-10-CM | POA: Diagnosis not present

## 2023-05-10 DIAGNOSIS — Z96612 Presence of left artificial shoulder joint: Secondary | ICD-10-CM | POA: Diagnosis not present

## 2023-05-11 DIAGNOSIS — Z96612 Presence of left artificial shoulder joint: Secondary | ICD-10-CM | POA: Diagnosis not present

## 2023-05-16 DIAGNOSIS — M25512 Pain in left shoulder: Secondary | ICD-10-CM | POA: Diagnosis not present

## 2023-05-16 DIAGNOSIS — Z96612 Presence of left artificial shoulder joint: Secondary | ICD-10-CM | POA: Diagnosis not present

## 2023-05-18 DIAGNOSIS — M6281 Muscle weakness (generalized): Secondary | ICD-10-CM | POA: Diagnosis not present

## 2023-05-18 DIAGNOSIS — Z96612 Presence of left artificial shoulder joint: Secondary | ICD-10-CM | POA: Diagnosis not present

## 2023-05-23 ENCOUNTER — Other Ambulatory Visit: Payer: Self-pay | Admitting: Internal Medicine

## 2023-05-23 DIAGNOSIS — Z96612 Presence of left artificial shoulder joint: Secondary | ICD-10-CM | POA: Diagnosis not present

## 2023-05-26 DIAGNOSIS — Z96612 Presence of left artificial shoulder joint: Secondary | ICD-10-CM | POA: Diagnosis not present

## 2023-05-26 DIAGNOSIS — M6281 Muscle weakness (generalized): Secondary | ICD-10-CM | POA: Diagnosis not present

## 2023-05-30 DIAGNOSIS — Z96612 Presence of left artificial shoulder joint: Secondary | ICD-10-CM | POA: Diagnosis not present

## 2023-05-30 DIAGNOSIS — M25512 Pain in left shoulder: Secondary | ICD-10-CM | POA: Diagnosis not present

## 2023-06-01 DIAGNOSIS — Z96612 Presence of left artificial shoulder joint: Secondary | ICD-10-CM | POA: Diagnosis not present

## 2023-06-02 ENCOUNTER — Other Ambulatory Visit (HOSPITAL_COMMUNITY): Payer: Self-pay

## 2023-06-05 DIAGNOSIS — H35351 Cystoid macular degeneration, right eye: Secondary | ICD-10-CM | POA: Diagnosis not present

## 2023-06-06 DIAGNOSIS — H35351 Cystoid macular degeneration, right eye: Secondary | ICD-10-CM | POA: Diagnosis not present

## 2023-06-08 DIAGNOSIS — M25512 Pain in left shoulder: Secondary | ICD-10-CM | POA: Diagnosis not present

## 2023-06-08 DIAGNOSIS — Z96612 Presence of left artificial shoulder joint: Secondary | ICD-10-CM | POA: Diagnosis not present

## 2023-06-13 DIAGNOSIS — Z96612 Presence of left artificial shoulder joint: Secondary | ICD-10-CM | POA: Diagnosis not present

## 2023-06-14 ENCOUNTER — Other Ambulatory Visit (HOSPITAL_COMMUNITY): Payer: Self-pay

## 2023-06-16 DIAGNOSIS — M25512 Pain in left shoulder: Secondary | ICD-10-CM | POA: Diagnosis not present

## 2023-06-16 DIAGNOSIS — Z96612 Presence of left artificial shoulder joint: Secondary | ICD-10-CM | POA: Diagnosis not present

## 2023-06-20 DIAGNOSIS — Z96612 Presence of left artificial shoulder joint: Secondary | ICD-10-CM | POA: Diagnosis not present

## 2023-06-20 DIAGNOSIS — M6281 Muscle weakness (generalized): Secondary | ICD-10-CM | POA: Diagnosis not present

## 2023-06-26 DIAGNOSIS — Z96612 Presence of left artificial shoulder joint: Secondary | ICD-10-CM | POA: Diagnosis not present

## 2023-06-26 DIAGNOSIS — M6281 Muscle weakness (generalized): Secondary | ICD-10-CM | POA: Diagnosis not present

## 2023-06-28 DIAGNOSIS — H35351 Cystoid macular degeneration, right eye: Secondary | ICD-10-CM | POA: Diagnosis not present

## 2023-06-28 DIAGNOSIS — H04123 Dry eye syndrome of bilateral lacrimal glands: Secondary | ICD-10-CM | POA: Diagnosis not present

## 2023-06-28 DIAGNOSIS — Z961 Presence of intraocular lens: Secondary | ICD-10-CM | POA: Diagnosis not present

## 2023-06-29 DIAGNOSIS — M25612 Stiffness of left shoulder, not elsewhere classified: Secondary | ICD-10-CM | POA: Diagnosis not present

## 2023-06-29 DIAGNOSIS — H538 Other visual disturbances: Secondary | ICD-10-CM | POA: Diagnosis not present

## 2023-06-29 DIAGNOSIS — Z96612 Presence of left artificial shoulder joint: Secondary | ICD-10-CM | POA: Diagnosis not present

## 2023-06-29 DIAGNOSIS — Z947 Corneal transplant status: Secondary | ICD-10-CM | POA: Diagnosis not present

## 2023-06-29 DIAGNOSIS — H04123 Dry eye syndrome of bilateral lacrimal glands: Secondary | ICD-10-CM | POA: Diagnosis not present

## 2023-07-03 DIAGNOSIS — M6281 Muscle weakness (generalized): Secondary | ICD-10-CM | POA: Diagnosis not present

## 2023-07-03 DIAGNOSIS — Z96612 Presence of left artificial shoulder joint: Secondary | ICD-10-CM | POA: Diagnosis not present

## 2023-07-05 DIAGNOSIS — M6281 Muscle weakness (generalized): Secondary | ICD-10-CM | POA: Diagnosis not present

## 2023-07-05 DIAGNOSIS — Z96612 Presence of left artificial shoulder joint: Secondary | ICD-10-CM | POA: Diagnosis not present

## 2023-07-10 DIAGNOSIS — Z96612 Presence of left artificial shoulder joint: Secondary | ICD-10-CM | POA: Diagnosis not present

## 2023-07-10 DIAGNOSIS — M6281 Muscle weakness (generalized): Secondary | ICD-10-CM | POA: Diagnosis not present

## 2023-07-12 DIAGNOSIS — M6281 Muscle weakness (generalized): Secondary | ICD-10-CM | POA: Diagnosis not present

## 2023-07-12 DIAGNOSIS — Z96612 Presence of left artificial shoulder joint: Secondary | ICD-10-CM | POA: Diagnosis not present

## 2023-07-17 ENCOUNTER — Ambulatory Visit (INDEPENDENT_AMBULATORY_CARE_PROVIDER_SITE_OTHER): Payer: PPO | Admitting: Internal Medicine

## 2023-07-17 VITALS — BP 104/70 | HR 73 | Temp 98.6°F | Ht 68.5 in | Wt 234.0 lb

## 2023-07-17 DIAGNOSIS — E1159 Type 2 diabetes mellitus with other circulatory complications: Secondary | ICD-10-CM | POA: Diagnosis not present

## 2023-07-17 DIAGNOSIS — Z0000 Encounter for general adult medical examination without abnormal findings: Secondary | ICD-10-CM | POA: Diagnosis not present

## 2023-07-17 DIAGNOSIS — F39 Unspecified mood [affective] disorder: Secondary | ICD-10-CM | POA: Diagnosis not present

## 2023-07-17 DIAGNOSIS — I10 Essential (primary) hypertension: Secondary | ICD-10-CM | POA: Diagnosis not present

## 2023-07-17 LAB — HM DIABETES FOOT EXAM

## 2023-07-17 MED ORDER — TIRZEPATIDE 7.5 MG/0.5ML ~~LOC~~ SOAJ: 7.5000 mg | SUBCUTANEOUS | 11 refills | Status: DC

## 2023-07-17 NOTE — Assessment & Plan Note (Signed)
 Seems to be well controlled with farxiga  5mg  and mounjaro 5mg  weekly Will increase the mounjaro and titrate till his appetite is down more

## 2023-07-17 NOTE — Assessment & Plan Note (Signed)
 Has done better lately Some reactive stress with wife's dementia Continue the citalopram  20

## 2023-07-17 NOTE — Assessment & Plan Note (Signed)
 BP Readings from Last 3 Encounters:  07/17/23 104/70  04/06/23 (!) 158/86  01/28/23 113/60   Controlled well on carvedilol  12.5 I, lisinopril  40mg  daily

## 2023-07-17 NOTE — Assessment & Plan Note (Signed)
 BMI 35 with DM, HTN, OSA, etc Is working on lifestyle and increasing mounjaro

## 2023-07-17 NOTE — Progress Notes (Signed)
 Hearing Screening - Comments:: Has hearing aids. Wearing them today Vision Screening - Comments:: November 2024

## 2023-07-17 NOTE — Progress Notes (Signed)
 Subjective:    Patient ID: James Camacho, male    DOB: 09-18-1950, 73 y.o.   MRN: [id number]  HPI Here for Medicare wellness visit and follow up of chronic health conditions Reviewed advanced directives Reviewed other doctors---Dr McGhee/Patel---ortho, Dr Artie Bimler, Dr Kemp--dentist, Dr Awanda Bogaert Had left reverse total shoulder arthroplasty in November--has been doing rehab after that. Still has pain and limited ROM Had fallen in November---fractured humerus--prompted the surgery Does walk regularly--some resistance work with PT No hospitalizations Still works part time Had retinal infection on right--now cleared up. Some trouble with left eye Hearing aides---they help Rare glass of wine No tobacco Had one fall--with injury Independent with instrumental ADLs No memory issues  Checking sugars--- averaging 125-130 Majority of time in green zone No problems with mounjaro---may need more kick No foot numbness, burning, tingling  Mood has been okay No persistent depression and enjoys work and other things Stress with wife and her dementia--not too severe as yet (just worse in afternoon) Does have help at home--and cameras to watch her when he is at work  No chest pain or SOB No dizziness or syncope No edema No palpitations Rare headache  Current Outpatient Medications on File Prior to Visit  Medication Sig Dispense Refill   amLODipine  (NORVASC ) 10 MG tablet Take 1 tablet (10 mg total) by mouth daily. 90 tablet 3   aspirin  EC 81 MG tablet Take 1 tablet (81 mg total) by mouth daily. Swallow whole. 1 tablet 0   atorvastatin  (LIPITOR) 10 MG tablet TAKE 1 TABLET BY MOUTH DAILY 90 tablet 3   carvedilol  (COREG ) 12.5 MG tablet Take 1 tablet (12.5 mg total) by mouth 2 (two) times daily with a meal. 180 tablet 3   citalopram  (CELEXA ) 20 MG tablet Take 1 tablet (20 mg total) by mouth daily. 90 tablet 3   dapagliflozin  propanediol (FARXIGA ) 5 MG TABS tablet Take 1 tablet  (5 mg total) by mouth daily before breakfast. 90 tablet 3   furosemide  (LASIX ) 40 MG tablet Take 1 tablet (40 mg total) by mouth daily. 90 tablet 3   lisinopril  (ZESTRIL ) 40 MG tablet Take 1 tablet (40 mg total) by mouth daily. 90 tablet 3   omeprazole  (PRILOSEC) 20 MG capsule Take 1 capsule (20 mg total) by mouth daily. 90 capsule 3   ondansetron  (ZOFRAN -ODT) 4 MG disintegrating tablet Take 1 tablet (4 mg total) by mouth every 6 (six) hours as needed for nausea or vomiting. 30 tablet 0   tirzepatide (MOUNJARO) 5 MG/0.5ML Pen INJECT 5 MG SUBCUTANEOUSLY WEEKLY 2 mL 5   traMADol  (ULTRAM ) 50 MG tablet Take 1 tablet (50 mg total) by mouth every 6 (six) hours as needed. 30 tablet 0   No current facility-administered medications on file prior to visit.    Allergies  Allergen Reactions   Aleve [Naproxen Sodium] Hives   Ibuprofen Hives   Shrimp (Diagnostic) Other (See Comments)    Itching and dry mouth   Shrimp Extract Other (See Comments)    Itching and dry mouth   Other     Pt can only take Tylenol  for pain- anything prescribed that he has tried breaks him out in a rash.    Penicillin G Rash   Penicillins Rash    Past Medical History:  Diagnosis Date   Anal condylomata    a.) s/p laser ablation 12/20/2018   Anginal pain (HCC)    Anxiety    Aortic atherosclerosis (HCC)    Coronary artery calcification seen on CT  scan    Depression    Diastolic dysfunction    a.) TTE 04/24/2020: EF 60-65%, no RWMAs, mild LVH, G2DD, mild LAE, mild MR   ED (erectile dysfunction)    a.) on PDE5i (tadalafil )   History of adenomatous polyp of colon    History of bilateral cataract extraction 2019   History of exercise stress test 04/17/2007   a.) ETT 04/17/2007: exercised 8:15 on Bruce protocol; max HR 155 bpm; no ECG changes (rest/stress) concerning for ischemia -- negative study   Humeral surgical neck fracture (LEFT) 01/21/2023   a.) s/p mechanical fall (lost balance); radiographs (+) for comminuted  impacted LEFT humeral neck fracture   Hyperlipidemia    LDL in 120's   Hypertension    Intraductal papilloma of breast, left    a.) s/p ductal system excision 11/26/2021   Long term current use of aspirin     Near syncope    OSA on CPAP    study in epic 08-11-2017 moderate osa   Osteopenia    Seasonal allergic rhinitis    Type 2 diabetes mellitus (HCC)    Wears glasses    Wears hearing aid in both ears     Past Surgical History:  Procedure Laterality Date   BICEPT TENODESIS Left 01/27/2023   Procedure: Left reverse shoulder arthroplasty, biceps tenodesis;  Surgeon: Lorri Rota, MD;  Location: [facility];  Service: Orthopedics;  Laterality: Left;   BREAST DUCTAL SYSTEM EXCISION Left 11/26/2021   Procedure: EXCISION DUCTAL SYSTEM BREAST;  Surgeon: Marshall Skeeter, MD;  Location: [facility];  Service: General;  Laterality: Left;   CATARACT EXTRACTION W/PHACO Right 06/28/2017   Procedure: CATARACT EXTRACTION PHACO AND INTRAOCULAR LENS PLACEMENT (IOC) RIGHT;  Surgeon: Annell Kidney, MD;  Location: [facility];  Service: Ophthalmology;  Laterality: Right;   CATARACT EXTRACTION W/PHACO Left 07/19/2017   Procedure: CATARACT EXTRACTION PHACO AND INTRAOCULAR LENS PLACEMENT (IOC) LEFT TOPICAL;  Surgeon: Annell Kidney, MD;  Location: [facility];  Service: Ophthalmology;  Laterality: Left;  IVA TOPICALLEFTDIABETIC   CHOLECYSTECTOMY OPEN  1980s   dr  Nicolette Barrio   AND APPENDECTOMY   COLONOSCOPY WITH PROPOFOL   last one 03-28-2018   dr pyrtle   Corneal replacements Bilateral    12/23 and 2/24   LASER ABLATION CONDOLAMATA N/A 12/20/2018   Procedure: LASER ABLATION OF CONDOLAMATA, EXAMINATION UNDER ANESTHESIA;  Surgeon: Candyce Champagne, MD;  Location: [facility];  Service: General;  Laterality: N/A;   REVERSE SHOULDER ARTHROPLASTY Left 01/27/2023   Procedure: Left reverse shoulder arthroplasty, biceps tenodesis;  Surgeon: Lorri Rota, MD;  Location: [facility];  Service: Orthopedics;  Laterality: Left;    Family History  Problem Relation Age of Onset   Diabetes Mother    Arthritis Mother    Hypertension Mother    Chronic Renal Failure Mother    Parkinsonism Father    Heart disease Father    Healthy Sister    Healthy Sister    Healthy Brother    Cancer Neg Hx        no colon or prostate cancer   Colon cancer Neg Hx    Esophageal cancer Neg Hx    Stomach cancer Neg Hx    Rectal cancer Neg Hx    Breast cancer Neg Hx     Social History   Socioeconomic History   Marital status: Married    Spouse name: Not on file   Number of children: 3   Years of education: Not on file  Highest education level: Associate degree: occupational, Scientist, product/process development, or vocational program  Occupational History   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   Occupation: Futures trader business    Comment:    Tobacco Use   Smoking status: Never    Passive exposure: Never   Smokeless tobacco: Never  Vaping Use   Vaping status: Never Used  Substance and Sexual Activity   Alcohol use: Not Currently    Comment: rare   Drug use: Never   Sexual activity: Not on file  Other Topics Concern   Not on file  Social History Narrative   Has living will   2 daughters should now be his health care POA   Would accept resuscitation   No prolonged tube feeds   Social Drivers of Health   Financial Resource Strain: Low Risk  (07/17/2023)   Overall Financial Resource Strain (CARDIA)    Difficulty of Paying Living Expenses: Not hard at all  Food Insecurity: No Food Insecurity (07/17/2023)   Hunger Vital Sign    Worried About Running Out of Food in the Last Year: Never true    Ran Out of Food in the Last Year: Never true  Transportation Needs: No Transportation Needs (07/17/2023)   PRAPARE - Administrator, Civil Service (Medical): No    Lack of Transportation (Non-Medical): No  Physical Activity:  Sufficiently Active (07/17/2023)   Exercise Vital Sign    Days of Exercise per Week: 3 days    Minutes of Exercise per Session: 60 min  Stress: Stress Concern Present (07/17/2023)   [employer] of Occupational Health - Occupational Stress Questionnaire    Feeling of Stress : Rather much  Social Connections: Socially Integrated (07/17/2023)   Social Connection and Isolation Panel [NHANES]    Frequency of Communication with Friends and Family: More than three times a week    Frequency of Social Gatherings with Friends and Family: Once a week    Attends Religious Services: More than 4 times per year    Active Member of [employer] or Organizations: Yes    Attends Engineer, structural: More than 4 times per year    Marital Status: Married  Catering manager Violence: Not At Risk (01/27/2023)   Humiliation, Afraid, Rape, and Kick questionnaire    Fear of Current or Ex-Partner: No    Emotionally Abused: No    Physically Abused: No    Sexually Abused: No   Review of Systems Weight is down a few pounds--still BMI 35 Appetite still not depressed Sleeps okay--as long as wife lets him Wears seat belt Teeth are fine---keeps up with dentist No suspicious skin lesions---hasn't seen derm in a while No other joint or back pains No heartburn or dysphagia Bowels move okay Voids fine---flow is okay, empties well    Objective:   Physical Exam Constitutional:      Appearance: Normal appearance.  HENT:     Mouth/Throat:     Pharynx: No oropharyngeal exudate or posterior oropharyngeal erythema.  Eyes:     Conjunctiva/sclera: Conjunctivae normal.     Pupils: Pupils are equal, round, and reactive to light.  Cardiovascular:     Rate and Rhythm: Normal rate and regular rhythm.     Pulses: Normal pulses.     Heart sounds: No murmur heard.    No gallop.  Pulmonary:     Effort: Pulmonary effort is normal.     Breath sounds: Normal breath  sounds. No wheezing or rales.  Abdominal:      Palpations: Abdomen is soft.     Tenderness: There is no abdominal tenderness.  Musculoskeletal:     Cervical back: Neck supple.     Right lower leg: No edema.     Left lower leg: No edema.  Lymphadenopathy:     Cervical: No cervical adenopathy.  Skin:    Findings: No lesion or rash.     Comments: No foot lesions  Neurological:     General: No focal deficit present.     Mental Status: He is alert and oriented to person, place, and time.     Comments: Normal sensation in feet Mini-cog normal  Psychiatric:        Mood and Affect: Mood normal.        Behavior: Behavior normal.            Assessment & Plan:

## 2023-07-17 NOTE — Assessment & Plan Note (Signed)
 I have personally reviewed the Medicare Annual Wellness questionnaire and have noted 1. The patient's medical and social history 2. Their use of alcohol, tobacco or illicit drugs 3. Their current medications and supplements 4. The patient's functional ability including ADL's, fall risks, home safety risks and hearing or visual             impairment. 5. Diet and physical activities 6. Evidence for depression or mood disorders  The patients weight, height, BMI and visual acuity have been recorded in the chart I have made referrals, counseling and provided education to the patient based review of the above and I have provided the pt with a written personalized care plan for preventive services.  I have provided you with a copy of your personalized plan for preventive services. Please take the time to review along with your updated medication list.  Due for colon---contacted Dr Bridgett Camps about this Done with PSA testing Is exercising Due for Td at pharmacy Flu vaccine in the fall--prefers no COVID

## 2023-07-18 ENCOUNTER — Ambulatory Visit: Payer: Self-pay | Admitting: Internal Medicine

## 2023-07-18 ENCOUNTER — Telehealth: Payer: Self-pay

## 2023-07-18 LAB — CBC
HCT: 47.6 % (ref 39.0–52.0)
Hemoglobin: 15.5 g/dL (ref 13.0–17.0)
MCHC: 32.6 g/dL (ref 30.0–36.0)
MCV: 89.7 fl (ref 78.0–100.0)
Platelets: 206 10*3/uL (ref 150.0–400.0)
RBC: 5.3 Mil/uL (ref 4.22–5.81)
RDW: 16.8 % — ABNORMAL HIGH (ref 11.5–15.5)
WBC: 6.8 10*3/uL (ref 4.0–10.5)

## 2023-07-18 LAB — LIPID PANEL
Cholesterol: 144 mg/dL (ref 0–200)
HDL: 38.6 mg/dL — ABNORMAL LOW (ref >39.00)
LDL Cholesterol: 75 mg/dL (ref 0–99)
NonHDL: 105.48
Total CHOL/HDL Ratio: 4
Triglycerides: 154 mg/dL — ABNORMAL HIGH (ref 0.0–149.0)
VLDL: 30.8 mg/dL (ref 0.0–40.0)

## 2023-07-18 LAB — COMPREHENSIVE METABOLIC PANEL WITH GFR
ALT: 27 U/L (ref 0–53)
AST: 19 U/L (ref 0–37)
Albumin: 4.3 g/dL (ref 3.5–5.2)
Alkaline Phosphatase: 67 U/L (ref 39–117)
BUN: 7 mg/dL (ref 6–23)
CO2: 32 meq/L (ref 19–32)
Calcium: 9.1 mg/dL (ref 8.4–10.5)
Chloride: 101 meq/L (ref 96–112)
Creatinine, Ser: 0.82 mg/dL (ref 0.40–1.50)
GFR: 88.59 mL/min (ref >60.00)
Glucose, Bld: 103 mg/dL — ABNORMAL HIGH (ref 70–99)
Potassium: 4 meq/L (ref 3.5–5.1)
Sodium: 139 meq/L (ref 135–145)
Total Bilirubin: 1.1 mg/dL (ref 0.2–1.2)
Total Protein: 7.1 g/dL (ref 6.0–8.3)

## 2023-07-18 LAB — MICROALBUMIN / CREATININE URINE RATIO
Creatinine,U: 182.2 mg/dL
Microalb Creat Ratio: 10.4 mg/g (ref 0.0–30.0)
Microalb, Ur: 1.9 mg/dL (ref 0.0–1.9)

## 2023-07-18 LAB — HEMOGLOBIN A1C: Hgb A1c MFr Bld: 6.3 % (ref 4.6–6.5)

## 2023-07-18 NOTE — Telephone Encounter (Signed)
 Patient scheduled colonoscopy for 08/21/23 at 2:30 pm and telephone nurse visit for 08/09/23 at 4:00 pm.

## 2023-07-18 NOTE — Telephone Encounter (Signed)
-----   Message from Amber Bail Pyrtle sent at 07/17/2023  5:09 PM EDT ----- Can you please arrange surveillance colon please Hx of polyps JMP ----- Message ----- From: Helaine Llanos, MD Sent: 07/17/2023   4:12 PM EDT To: Nannette Babe, MD  Marijean Shouts, He had a 5 year colon recall from 03/2018 but doesn't remember getting a letter. Can you have your staff contact him? Rich

## 2023-07-19 DIAGNOSIS — M25512 Pain in left shoulder: Secondary | ICD-10-CM | POA: Diagnosis not present

## 2023-07-19 DIAGNOSIS — Z96612 Presence of left artificial shoulder joint: Secondary | ICD-10-CM | POA: Diagnosis not present

## 2023-07-21 ENCOUNTER — Encounter: Payer: Self-pay | Admitting: Internal Medicine

## 2023-07-21 ENCOUNTER — Telehealth: Admitting: Family Medicine

## 2023-07-21 DIAGNOSIS — J40 Bronchitis, not specified as acute or chronic: Secondary | ICD-10-CM

## 2023-07-21 MED ORDER — PROMETHAZINE-DM 6.25-15 MG/5ML PO SYRP: 5.0000 mL | ORAL_SOLUTION | Freq: Four times a day (QID) | ORAL | 0 refills | Status: DC | PRN

## 2023-07-21 MED ORDER — AZITHROMYCIN 250 MG PO TABS: ORAL_TABLET | ORAL | 0 refills | Status: AC

## 2023-07-21 NOTE — Patient Instructions (Signed)

## 2023-07-21 NOTE — Progress Notes (Signed)
 Virtual Visit Consent   James Camacho, you are scheduled for a virtual visit with a [facility] provider today. Just as with appointments in the office, your consent must be obtained to participate. Your consent will be active for this visit and any virtual visit you may have with one of our providers in the next 365 days. If you have a MyChart account, a copy of this consent can be sent to you electronically.  As this is a virtual visit, video technology does not allow for your provider to perform a traditional examination. This may limit your provider's ability to fully assess your condition. If your provider identifies any concerns that need to be evaluated in person or the need to arrange testing (such as labs, EKG, etc.), we will make arrangements to do so. Although advances in technology are sophisticated, we cannot ensure that it will always work on either your end or our end. If the connection with a video visit is poor, the visit may have to be switched to a telephone visit. With either a video or telephone visit, we are not always able to ensure that we have a secure connection.  By engaging in this virtual visit, you consent to the provision of healthcare and authorize for your insurance to be billed (if applicable) for the services provided during this visit. Depending on your insurance coverage, you may receive a charge related to this service.  I need to obtain your verbal consent now. Are you willing to proceed with your visit today? James Camacho has provided verbal consent on 07/21/2023 for a virtual visit (video or telephone). Albertha Huger, FNP  Date: 07/21/2023 10:18 AM   Virtual Visit via Video Note   I, Albertha Huger, connected with  James Camacho  ([id number], 1951/02/21) on 07/21/23 at 10:15 AM EDT by a video-enabled telemedicine application and verified that I am speaking with the correct person using two identifiers.  Location: Patient: Virtual Visit Location  Patient: Home Provider: Virtual Visit Location Provider: Home Office   I discussed the limitations of evaluation and management by telemedicine and the availability of in person appointments. The patient expressed understanding and agreed to proceed.    History of Present Illness: James Camacho is a 73 y.o. who identifies as a male who was assigned male at birth, and is being seen today for croupy cough with post nasal drainage, cough is keeping him up at night, no wheezing or sob, no fever. In no distress. 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    Penicillin G Rash  Penicillins Rash   Medications:  Current Outpatient Medications:    azithromycin (ZITHROMAX) 250 MG tablet, Take 2 tablets on day 1, then 1 tablet daily on days 2 through 5, Disp: 6 tablet, Rfl: 0   promethazine-dextromethorphan (PROMETHAZINE-DM) 6.25-15 MG/5ML syrup, Take 5 mLs by mouth 4 (four) times daily as needed for up to 10 days for cough., Disp: 118 mL, Rfl: 0   amLODipine  (NORVASC ) 10 MG tablet, Take 1 tablet (10 mg total) by mouth daily., Disp:  90 tablet, Rfl: 3   aspirin  EC 81 MG tablet, Take 1 tablet (81 mg total) by mouth daily. Swallow whole., Disp: 1 tablet, Rfl: 0   atorvastatin  (LIPITOR) 10 MG tablet, TAKE 1 TABLET BY MOUTH DAILY, Disp: 90 tablet, Rfl: 3   carvedilol  (COREG ) 12.5 MG tablet, Take 1 tablet (12.5 mg total) by mouth 2 (two) times daily with a meal., Disp: 180 tablet, Rfl: 3   citalopram  (CELEXA ) 20 MG tablet, Take 1 tablet (20 mg total) by mouth daily., Disp: 90 tablet, Rfl: 3   dapagliflozin  propanediol (FARXIGA ) 5 MG TABS tablet, Take 1 tablet (5 mg total) by mouth daily before breakfast., Disp: 90 tablet, Rfl: 3   furosemide  (LASIX ) 40 MG tablet, Take 1 tablet (40 mg total) by mouth daily., Disp: 90 tablet, Rfl: 3   lisinopril  (ZESTRIL ) 40 MG tablet, Take 1 tablet (40 mg total) by mouth daily., Disp: 90 tablet, Rfl: 3   omeprazole  (PRILOSEC) 20 MG capsule, Take 1 capsule (20 mg total) by mouth daily., Disp: 90 capsule, Rfl: 3   ondansetron  (ZOFRAN -ODT) 4 MG disintegrating tablet, Take 1 tablet (4 mg total) by mouth every 6 (six) hours as needed for nausea or vomiting., Disp: 30 tablet, Rfl: 0   tirzepatide (MOUNJARO) 7.5 MG/0.5ML Pen, Inject 7.5 mg into the skin once a week., Disp: 2 mL, Rfl: 11   traMADol  (ULTRAM ) 50 MG tablet, Take 1 tablet (50 mg total) by mouth every 6 (six) hours as needed., Disp: 30 tablet, Rfl: 0  Observations/Objective: Patient is well-developed, well-nourished in no acute distress.  Resting comfortably  at home.  Head is normocephalic, atraumatic.  No labored breathing.  Speech is clear and coherent with logical content.  Patient is alert and oriented at baseline.    Assessment and Plan: 1. Bronchitis (Primary)  Increase fluids, humidifier at night, UC if sx persist or worsen.   Follow Up Instructions: I discussed the assessment and treatment plan with the patient. The patient was provided an opportunity to ask questions and all were answered. The patient agreed with the plan and  demonstrated an understanding of the instructions.  A copy of instructions were sent to the patient via MyChart unless otherwise noted below.     The patient was advised to call back or seek an in-person evaluation if the symptoms worsen or if the condition fails to improve as anticipated.    Donavon Kimrey, FNP

## 2023-07-23 ENCOUNTER — Telehealth: Admitting: Family

## 2023-07-23 DIAGNOSIS — J209 Acute bronchitis, unspecified: Secondary | ICD-10-CM

## 2023-07-23 MED ORDER — PROMETHAZINE-DM 6.25-15 MG/5ML PO SYRP: 5.0000 mL | ORAL_SOLUTION | Freq: Four times a day (QID) | ORAL | 0 refills | Status: AC | PRN

## 2023-07-23 MED ORDER — PREDNISONE 10 MG (21) PO TBPK: ORAL_TABLET | ORAL | 0 refills | Status: DC

## 2023-07-23 MED ORDER — BENZONATATE 200 MG PO CAPS: 200.0000 mg | ORAL_CAPSULE | Freq: Two times a day (BID) | ORAL | 0 refills | Status: DC | PRN

## 2023-07-23 NOTE — Progress Notes (Signed)
 Virtual Visit Consent   James Camacho, you are scheduled for a virtual visit with a [facility] provider today. Just as with appointments in the office, your consent must be obtained to participate. Your consent will be active for this visit and any virtual visit you may have with one of our providers in the next 365 days. If you have a MyChart account, a copy of this consent can be sent to you electronically.  As this is a virtual visit, video technology does not allow for your provider to perform a traditional examination. This may limit your provider's ability to fully assess your condition. If your provider identifies any concerns that need to be evaluated in person or the need to arrange testing (such as labs, EKG, etc.), we will make arrangements to do so. Although advances in technology are sophisticated, we cannot ensure that it will always work on either your end or our end. If the connection with a video visit is poor, the visit may have to be switched to a telephone visit. With either a video or telephone visit, we are not always able to ensure that we have a secure connection.  By engaging in this virtual visit, you consent to the provision of healthcare and authorize for your insurance to be billed (if applicable) for the services provided during this visit. Depending on your insurance coverage, you may receive a charge related to this service.  I need to obtain your verbal consent now. Are you willing to proceed with your visit today? James Camacho has provided verbal consent on 07/23/2023 for a virtual visit (video or telephone). James Fragmin, FNP  Date: 07/23/2023 7:14 PM   Virtual Visit via Video Note   I, James Camacho, connected with  James Camacho  ([id number], [birth year]) on 07/23/23 at  7:15 PM EDT by a video-enabled telemedicine application and verified that I am speaking with the correct person using two identifiers.  Location: Patient: Virtual Visit Location  Patient: Home Provider: Virtual Visit Location Provider: Home Office   I discussed the limitations of evaluation and management by telemedicine and the availability of in person appointments. The patient expressed understanding and agreed to proceed.    History of Present Illness: James Camacho is a 73 y.o. who identifies as a male who was assigned male at birth, and is being seen today for cough and hoarse voice that started 5 days ago. He completed a Zpak and cough medication from his PCP.  HPI: Cough This is a new problem. The current episode started in the past 7 days. The problem has been gradually worsening. The problem occurs every few minutes. The cough is Productive of sputum. Associated symptoms include ear congestion, ear pain and wheezing. Pertinent negatives include no chills, fever, headaches, myalgias, nasal congestion, postnasal drip or shortness of breath. Associated symptoms comments: Rib pain from coughing . He has tried rest and OTC cough suppressant (zpak) for the symptoms. The treatment provided mild relief.    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    Penicillin G Rash   Penicillins Rash   Medications:  Current Outpatient Medications:    benzonatate  (TESSALON ) 200 MG capsule, Take 1 capsule (200 mg total) by mouth 2 (two) times daily as needed for cough., Disp: 20 capsule, Rfl: 0   predniSONE  (STERAPRED UNI-PAK 21 TAB) 10 MG (21) TBPK tablet, Use as directed, Disp: 21 tablet, Rfl: 0   amLODipine  (NORVASC ) 10 MG tablet, Take 1 tablet (10 mg total) by mouth daily., Disp: 90 tablet, Rfl: 3   aspirin  EC 81 MG tablet, Take 1 tablet (81 mg total) by mouth daily. Swallow whole., Disp: 1 tablet, Rfl: 0   atorvastatin  (LIPITOR) 10 MG tablet, TAKE 1 TABLET BY MOUTH DAILY, Disp: 90 tablet, Rfl: 3   azithromycin  (ZITHROMAX ) 250 MG tablet, Take 2 tablets on day 1, then 1 tablet daily on days 2 through 5, Disp: 6 tablet, Rfl: 0   carvedilol  (COREG ) 12.5 MG tablet, Take 1 tablet (12.5 mg total) by mouth 2 (two) times daily with a meal., Disp: 180 tablet, Rfl: 3   citalopram  (CELEXA ) 20 MG tablet, Take 1 tablet (20 mg total) by mouth daily., Disp: 90 tablet, Rfl: 3   dapagliflozin  propanediol (FARXIGA ) 5 MG TABS tablet, Take 1 tablet (5 mg total) by mouth daily before breakfast., Disp: 90 tablet, Rfl: 3   furosemide  (LASIX ) 40 MG tablet, Take 1 tablet (40 mg total) by mouth daily., Disp: 90 tablet, Rfl: 3   lisinopril  (ZESTRIL ) 40 MG tablet, Take 1 tablet (40 mg total) by mouth daily., Disp: 90 tablet, Rfl: 3   omeprazole  (PRILOSEC) 20 MG capsule, Take 1 capsule (20 mg total) by mouth daily., Disp: 90 capsule, Rfl: 3   ondansetron  (ZOFRAN -ODT) 4 MG disintegrating tablet, Take 1 tablet (4 mg total) by mouth every 6 (six) hours as needed for nausea or vomiting., Disp: 30 tablet, Rfl: 0   promethazine -dextromethorphan (PROMETHAZINE -DM) 6.25-15 MG/5ML syrup, Take 5 mLs by mouth 4 (four) times daily as needed for up to 10 days for cough., Disp: 118 mL, Rfl: 0   tirzepatide (MOUNJARO) 7.5 MG/0.5ML Pen, Inject 7.5 mg into the  skin once a week., Disp: 2 mL, Rfl: 11   traMADol  (ULTRAM ) 50 MG tablet, Take 1 tablet (50 mg total) by mouth every 6 (six) hours as needed., Disp: 30 tablet, Rfl: 0  Observations/Objective: Patient is well-developed, well-nourished in no acute distress.  Resting comfortably  at home.  Head is normocephalic, atraumatic.  No labored breathing.  Speech is clear and coherent with logical content.  Patient is alert and oriented at baseline.  Hoarse voice Tight nonproductive cough  Assessment and Plan: 1. Acute bronchitis, unspecified organism (Primary) - predniSONE  (STERAPRED UNI-PAK 21 TAB) 10 MG (21) TBPK tablet; Use as directed  Dispense: 21 tablet; Refill: 0 - benzonatate  (TESSALON ) 200 MG capsule; Take 1 capsule (200 mg total) by mouth 2 (two) times daily as needed for cough.  Dispense: 20 capsule; Refill: 0 - promethazine -dextromethorphan (PROMETHAZINE -DM) 6.25-15 MG/5ML syrup; Take 5 mLs by mouth 4 (four) times daily as needed for up to 10 days for  cough.  Dispense: 118 mL; Refill: 0  - Take meds as prescribed - Use a cool mist humidifier  -Use saline nose sprays frequently -Force fluids -For any cough or congestion  Use plain Mucinex- regular strength or max strength is fine -For fever or aces or pains- take tylenol  or ibuprofen. -Throat lozenges if help -Follow up if symptoms worsen or do not improve   Follow Up Instructions: I discussed the assessment and treatment plan with the patient. The patient was provided an opportunity to ask questions and all were answered. The patient agreed with the plan and demonstrated an understanding of the instructions.  A copy of instructions were sent to the patient via MyChart unless otherwise noted below.     The patient was advised to call back or seek an in-person evaluation if the symptoms worsen or if the condition fails to improve as anticipated.    James Fragmin, FNP

## 2023-07-24 DIAGNOSIS — H35351 Cystoid macular degeneration, right eye: Secondary | ICD-10-CM | POA: Diagnosis not present

## 2023-08-01 DIAGNOSIS — H0100A Unspecified blepharitis right eye, upper and lower eyelids: Secondary | ICD-10-CM | POA: Diagnosis not present

## 2023-08-01 DIAGNOSIS — H04123 Dry eye syndrome of bilateral lacrimal glands: Secondary | ICD-10-CM | POA: Diagnosis not present

## 2023-08-02 DIAGNOSIS — M25512 Pain in left shoulder: Secondary | ICD-10-CM | POA: Diagnosis not present

## 2023-08-02 DIAGNOSIS — Z96612 Presence of left artificial shoulder joint: Secondary | ICD-10-CM | POA: Diagnosis not present

## 2023-08-04 DIAGNOSIS — Z96612 Presence of left artificial shoulder joint: Secondary | ICD-10-CM | POA: Diagnosis not present

## 2023-08-04 DIAGNOSIS — M25612 Stiffness of left shoulder, not elsewhere classified: Secondary | ICD-10-CM | POA: Diagnosis not present

## 2023-08-08 DIAGNOSIS — M25612 Stiffness of left shoulder, not elsewhere classified: Secondary | ICD-10-CM | POA: Diagnosis not present

## 2023-08-08 DIAGNOSIS — Z96612 Presence of left artificial shoulder joint: Secondary | ICD-10-CM | POA: Diagnosis not present

## 2023-08-09 ENCOUNTER — Ambulatory Visit (AMBULATORY_SURGERY_CENTER)

## 2023-08-09 ENCOUNTER — Encounter: Payer: Self-pay | Admitting: Internal Medicine

## 2023-08-09 VITALS — Ht 68.5 in | Wt 226.0 lb

## 2023-08-09 DIAGNOSIS — Z860100 Personal history of colon polyps, unspecified: Secondary | ICD-10-CM

## 2023-08-09 DIAGNOSIS — Z96612 Presence of left artificial shoulder joint: Secondary | ICD-10-CM | POA: Diagnosis not present

## 2023-08-09 MED ORDER — NA SULFATE-K SULFATE-MG SULF 17.5-3.13-1.6 GM/177ML PO SOLN: 1.0000 | Freq: Once | ORAL | 0 refills | Status: AC

## 2023-08-09 NOTE — Patient Instructions (Signed)
  ORAL DIABETIC MEDICATION INSTRUCTIONS Metformin  Glipizide  Jardiance  Glimepiride Farixga Januvia  Rybelsus , Xigduo, Sitagliptin, Synjardy Tradjenta Actos, Alogliptin Invokana  The day before your procedure: 6/15  Do not take your diabetic pill   The day of your procedure: 6/16  Do not take your diabetic pill   We will check your blood sugar levels during the admission process and again in Recovery before discharging you home  _____________________________________________________________________________  ONCE A WEEK INJECTIONS Ozempic ,  Mounjaro, Wegovy , Trulicity , Tanzeum, Byetta, Victoza , Bydureon, & SymlinPen  -DO NOT TAKE 7 days prior to the procedure.  Last dose on or before Sunday 6/8 failure to hold this medication will result in a cancellation or rescheduling of your procedure

## 2023-08-09 NOTE — Progress Notes (Signed)

## 2023-08-11 DIAGNOSIS — M25612 Stiffness of left shoulder, not elsewhere classified: Secondary | ICD-10-CM | POA: Diagnosis not present

## 2023-08-11 DIAGNOSIS — Z96612 Presence of left artificial shoulder joint: Secondary | ICD-10-CM | POA: Diagnosis not present

## 2023-08-13 ENCOUNTER — Encounter: Payer: Self-pay | Admitting: Internal Medicine

## 2023-08-14 ENCOUNTER — Telehealth: Payer: Self-pay | Admitting: *Deleted

## 2023-08-14 NOTE — Telephone Encounter (Signed)
 Dr. Bridgett Camps,  This pt is scheduled with you on 6/16.  He is a documented difficult intubation and his procedure will need to be done at the hospital.

## 2023-08-15 NOTE — Telephone Encounter (Signed)
 Appt cancelled for 6/16. Pt knows we will contact him to reschedule the colon at the hospital.

## 2023-08-15 NOTE — Telephone Encounter (Signed)
 patient's procedure needs to be moved from the [facility] to the outpatient hospital setting due to difficult airway history Please cancel the [facility] procedure, schedule at the hospital and notify the patient

## 2023-08-16 ENCOUNTER — Other Ambulatory Visit: Payer: Self-pay

## 2023-08-16 DIAGNOSIS — Z860100 Personal history of colon polyps, unspecified: Secondary | ICD-10-CM

## 2023-08-16 MED ORDER — NA SULFATE-K SULFATE-MG SULF 17.5-3.13-1.6 GM/177ML PO SOLN: ORAL | 0 refills | Status: AC

## 2023-08-16 NOTE — Telephone Encounter (Signed)
 Pts colon rescheduled at [facility] 09/28/23 @11 :30am, arrival time 10am. New instructions sent to pt via mychart, and [facility]. Pt aware. [id number].

## 2023-08-17 DIAGNOSIS — M25512 Pain in left shoulder: Secondary | ICD-10-CM | POA: Diagnosis not present

## 2023-08-17 DIAGNOSIS — Z96612 Presence of left artificial shoulder joint: Secondary | ICD-10-CM | POA: Diagnosis not present

## 2023-08-21 ENCOUNTER — Encounter: Admitting: Internal Medicine

## 2023-08-22 DIAGNOSIS — Z96612 Presence of left artificial shoulder joint: Secondary | ICD-10-CM | POA: Diagnosis not present

## 2023-08-22 DIAGNOSIS — M25512 Pain in left shoulder: Secondary | ICD-10-CM | POA: Diagnosis not present

## 2023-08-24 DIAGNOSIS — Z96612 Presence of left artificial shoulder joint: Secondary | ICD-10-CM | POA: Diagnosis not present

## 2023-08-24 DIAGNOSIS — M25612 Stiffness of left shoulder, not elsewhere classified: Secondary | ICD-10-CM | POA: Diagnosis not present

## 2023-08-29 DIAGNOSIS — M6281 Muscle weakness (generalized): Secondary | ICD-10-CM | POA: Diagnosis not present

## 2023-08-29 DIAGNOSIS — Z96612 Presence of left artificial shoulder joint: Secondary | ICD-10-CM | POA: Diagnosis not present

## 2023-08-31 ENCOUNTER — Other Ambulatory Visit: Payer: Self-pay | Admitting: Internal Medicine

## 2023-09-01 DIAGNOSIS — H02883 Meibomian gland dysfunction of right eye, unspecified eyelid: Secondary | ICD-10-CM | POA: Diagnosis not present

## 2023-09-01 DIAGNOSIS — Z96612 Presence of left artificial shoulder joint: Secondary | ICD-10-CM | POA: Diagnosis not present

## 2023-09-01 DIAGNOSIS — H04123 Dry eye syndrome of bilateral lacrimal glands: Secondary | ICD-10-CM | POA: Diagnosis not present

## 2023-09-01 DIAGNOSIS — M25512 Pain in left shoulder: Secondary | ICD-10-CM | POA: Diagnosis not present

## 2023-09-05 DIAGNOSIS — M25512 Pain in left shoulder: Secondary | ICD-10-CM | POA: Diagnosis not present

## 2023-09-05 DIAGNOSIS — Z96612 Presence of left artificial shoulder joint: Secondary | ICD-10-CM | POA: Diagnosis not present

## 2023-09-11 DIAGNOSIS — M6281 Muscle weakness (generalized): Secondary | ICD-10-CM | POA: Diagnosis not present

## 2023-09-11 DIAGNOSIS — Z96612 Presence of left artificial shoulder joint: Secondary | ICD-10-CM | POA: Diagnosis not present

## 2023-09-14 DIAGNOSIS — Z96612 Presence of left artificial shoulder joint: Secondary | ICD-10-CM | POA: Diagnosis not present

## 2023-09-14 DIAGNOSIS — M6281 Muscle weakness (generalized): Secondary | ICD-10-CM | POA: Diagnosis not present

## 2023-09-19 DIAGNOSIS — Z96612 Presence of left artificial shoulder joint: Secondary | ICD-10-CM | POA: Diagnosis not present

## 2023-09-19 DIAGNOSIS — M25512 Pain in left shoulder: Secondary | ICD-10-CM | POA: Diagnosis not present

## 2023-09-21 ENCOUNTER — Telehealth: Payer: Self-pay | Admitting: Gastroenterology

## 2023-09-21 NOTE — Telephone Encounter (Signed)
 Procedure:Colonoscopy Procedure date: 09/28/23 Procedure location: [facility] Arrival Time: 10:00 am Spoke with the patient Y/N: Yes Any prep concerns? No  Has the patient obtained the prep from the pharmacy ? Yes Do you have a care partner and transportation: Yes Any additional concerns? No

## 2023-09-22 ENCOUNTER — Telehealth: Admitting: Physician Assistant

## 2023-09-22 DIAGNOSIS — B9689 Other specified bacterial agents as the cause of diseases classified elsewhere: Secondary | ICD-10-CM

## 2023-09-22 DIAGNOSIS — J069 Acute upper respiratory infection, unspecified: Secondary | ICD-10-CM

## 2023-09-22 MED ORDER — BENZONATATE 100 MG PO CAPS: 100.0000 mg | ORAL_CAPSULE | Freq: Three times a day (TID) | ORAL | 0 refills | Status: DC | PRN

## 2023-09-22 MED ORDER — AZITHROMYCIN 250 MG PO TABS: ORAL_TABLET | ORAL | 0 refills | Status: AC

## 2023-09-22 NOTE — Patient Instructions (Signed)
 James Camacho, thank you for joining James CHRISTELLA Dickinson, PA-C for today's virtual visit.  While this provider is not your primary care provider (PCP), if your PCP is located in our provider database this encounter information will be shared with them immediately following your visit.   A [place] MyChart account gives you access to today's visit and all your visits, tests, and labs performed at [facility]  click here if you don't have a  MyChart account or go to [email or web address]  Consent: (Patient) James Camacho provided verbal consent for this virtual visit at the beginning of the encounter.  Current Medications:  Current Outpatient Medications:    azithromycin  (ZITHROMAX ) 250 MG tablet, Take 2 tablets on day 1, then 1 tablet daily on days 2 through 5, Disp: 6 tablet, Rfl: 0   benzonatate  (TESSALON ) 100 MG capsule, Take 1-2 capsules (100-200 mg total) by mouth 3 (three) times daily as needed., Disp: 30 capsule, Rfl: 0   Na Sulfate-K Sulfate-Mg Sulfate concentrate (SUPREP) 17.5-3.13-1.6 GM/177ML SOLN, Take as directed., Disp: 354 mL, Rfl: 0   amLODipine  (NORVASC ) 10 MG tablet, Take 1 tablet (10 mg total) by mouth daily., Disp: 90 tablet, Rfl: 3   aspirin  EC 81 MG tablet, Take 1 tablet (81 mg total) by mouth daily. Swallow whole., Disp: 1 tablet, Rfl: 0   atorvastatin  (LIPITOR) 10 MG tablet, TAKE 1 TABLET BY MOUTH DAILY, Disp: 90 tablet, Rfl: 3   carvedilol  (COREG ) 12.5 MG tablet, Take 1 tablet (12.5 mg total) by mouth 2 (two) times daily with a meal., Disp: 180 tablet, Rfl: 3   citalopram  (CELEXA ) 20 MG tablet, Take 1 tablet (20 mg total) by mouth daily., Disp: 90 tablet, Rfl: 3   dapagliflozin  propanediol (FARXIGA ) 5 MG TABS tablet, Take 1 tablet (5 mg total) by mouth daily before breakfast., Disp: 90 tablet, Rfl: 3   DULoxetine  (CYMBALTA ) 30 MG capsule, Take 30 mg by mouth daily., Disp: , Rfl:    erythromycin ophthalmic ointment, 1 Application.,  Disp: , Rfl:    furosemide  (LASIX ) 40 MG tablet, Take 1 tablet (40 mg total) by mouth daily., Disp: 90 tablet, Rfl: 3   ibuprofen (ADVIL) 800 MG tablet, Take 800 mg by mouth., Disp: , Rfl:    ketorolac  (ACULAR ) 0.5 % ophthalmic solution, SMARTSIG:In Eye(s), Disp: , Rfl:    lisinopril  (ZESTRIL ) 40 MG tablet, Take 1 tablet (40 mg total) by mouth daily., Disp: 90 tablet, Rfl: 3   metoprolol  tartrate (LOPRESSOR ) 100 MG tablet, Take 100 mg by mouth., Disp: , Rfl:    MIEBO 1.338 GM/ML SOLN, , Disp: , Rfl:    omeprazole  (PRILOSEC) 20 MG capsule, Take 1 capsule (20 mg total) by mouth daily., Disp: 90 capsule, Rfl: 3   ondansetron  (ZOFRAN -ODT) 4 MG disintegrating tablet, Take 1 tablet (4 mg total) by mouth every 6 (six) hours as needed for nausea or vomiting. (Patient not taking: Reported on 08/09/2023), Disp: 30 tablet, Rfl: 0   prednisoLONE  acetate (PRED FORTE ) 1 % ophthalmic suspension, , Disp: , Rfl:    tadalafil  (CIALIS ) 20 MG tablet, TAKE ONE-HALF TO ONE TABLET BY MOUTH ONE TIME EVERY OTHER DAY AS NEEDED FOR ERECTILE DYSFUNCTION, Disp: 10 tablet, Rfl: 11   tirzepatide  (MOUNJARO ) 7.5 MG/0.5ML Pen, Inject 7.5 mg into the skin once a week., Disp: 2 mL, Rfl: 11   traMADol  (ULTRAM ) 50 MG tablet, Take 1 tablet (50 mg total) by mouth every 6 (six) hours as needed., Disp: 30 tablet, Rfl: 0   Medications  ordered in this encounter:  Meds ordered this encounter  Medications   azithromycin  (ZITHROMAX ) 250 MG tablet    Sig: Take 2 tablets on day 1, then 1 tablet daily on days 2 through 5    Dispense:  6 tablet    Refill:  0    Supervising Provider:   LAMPTEY, PHILIP O [[id number]]   benzonatate  (TESSALON ) 100 MG capsule    Sig: Take 1-2 capsules (100-200 mg total) by mouth 3 (three) times daily as needed.    Dispense:  30 capsule    Refill:  0    Supervising Provider:   BLAISE ALEENE KIDD [[id number]]     *If you need refills on other medications prior to your next appointment, please contact your  pharmacy*  Follow-Up: Call back or seek an in-person evaluation if the symptoms worsen or if the condition fails to improve as anticipated.  [facility] [phone]  Other Instructions Upper Respiratory Infection, Adult An upper respiratory infection (URI) is a common viral infection of the nose, throat, and upper air passages that lead to the lungs. The most common type of URI is the common cold. URIs usually get better on their own, without medical treatment. What are the causes? A URI is caused by a virus. You may catch a virus by: Breathing in droplets from an infected person's cough or sneeze. Touching something that has been exposed to the virus (is contaminated) and then touching your mouth, nose, or eyes. What increases the risk? You are more likely to get a URI if: You are very young or very old. You have close contact with others, such as at work, school, or a health care facility. You smoke. You have long-term (chronic) heart or lung disease. You have a weakened disease-fighting system (immune system). You have nasal allergies or asthma. You are experiencing a lot of stress. You have poor nutrition. What are the signs or symptoms? A URI usually involves some of the following symptoms: Runny or stuffy (congested) nose. Cough. Sneezing. Sore throat. Headache. Fatigue. Fever. Loss of appetite. Pain in your forehead, behind your eyes, and over your cheekbones (sinus pain). Muscle aches. Redness or irritation of the eyes. Pressure in the ears or face. How is this diagnosed? This condition may be diagnosed based on your medical history and symptoms, and a physical exam. Your health care provider may use a swab to take a mucus sample from your nose (nasal swab). This sample can be tested to determine what virus is causing the illness. How is this treated? URIs usually get better on their own within 7-10 days. Medicines cannot cure URIs, but your health  care provider may recommend certain medicines to help relieve symptoms, such as: Over-the-counter cold medicines. Cough suppressants. Coughing is a type of defense against infection that helps to clear the respiratory system, so take these medicines only as recommended by your health care provider. Fever-reducing medicines. Follow these instructions at home: Activity Rest as needed. If you have a fever, stay home from work or school until your fever is gone or until your health care provider says your URI cannot spread to other people (is no longer contagious). Your health care provider may have you wear a face mask to prevent your infection from spreading. Relieving symptoms Gargle with a mixture of salt and water 3-4 times a day or as needed. To make salt water, completely dissolve -1 tsp (3-6 g) of salt in 1 cup (237 mL) of warm water.  Use a cool-mist humidifier to add moisture to the air. This can help you breathe more easily. Eating and drinking  Drink enough fluid to keep your urine pale yellow. Eat soups and other clear broths. General instructions  Take over-the-counter and prescription medicines only as told by your health care provider. These include cold medicines, fever reducers, and cough suppressants. Do not use any products that contain nicotine or tobacco. These products include cigarettes, chewing tobacco, and vaping devices, such as e-cigarettes. If you need help quitting, ask your health care provider. Stay away from secondhand smoke. Stay up to date on all immunizations, including the yearly (annual) flu vaccine. Keep all follow-up visits. This is important. How to prevent the spread of infection to others URIs can be contagious. To prevent the infection from spreading: Wash your hands with soap and water for at least 20 seconds. If soap and water are not available, use hand sanitizer. Avoid touching your mouth, face, eyes, or nose. Cough or sneeze into a tissue or your  sleeve or elbow instead of into your hand or into the air.  Contact a health care provider if: You are getting worse instead of better. You have a fever or chills. Your mucus is brown or red. You have yellow or brown discharge coming from your nose. You have pain in your face, especially when you bend forward. You have swollen neck glands. You have pain while swallowing. You have white areas in the back of your throat. Get help right away if: You have shortness of breath that gets worse. You have severe or persistent: Headache. Ear pain. Sinus pain. Chest pain. You have chronic lung disease along with any of the following: Making high-pitched whistling sounds when you breathe, most often when you breathe out (wheezing). Prolonged cough (more than 14 days). Coughing up blood. A change in your usual mucus. You have a stiff neck. You have changes in your: Vision. Hearing. Thinking. Mood. These symptoms may be an emergency. Get help right away. Call 911. Do not wait to see if the symptoms will go away. Do not drive yourself to the hospital. Summary An upper respiratory infection (URI) is a common infection of the nose, throat, and upper air passages that lead to the lungs. A URI is caused by a virus. URIs usually get better on their own within 7-10 days. Medicines cannot cure URIs, but your health care provider may recommend certain medicines to help relieve symptoms. This information is not intended to replace advice given to you by your health care provider. Make sure you discuss any questions you have with your health care provider. Document Revised: 09/23/2020 Document Reviewed: 09/23/2020 Elsevier Patient Education  2024 Elsevier Inc.   If you have been instructed to have an in-person evaluation today at a local Urgent Care facility, please use the link below. It will take you to a list of all of our available [facility], including address, phone number and  hours of operation. Please do not delay care.  [facility]  If you or a family member do not have a primary care provider, use the link below to schedule a visit and establish care. When you choose a [facility] primary care physician or advanced practice provider, you gain a long-term partner in health. Find a Primary Care Provider  Learn more about [facility] in-office and virtual care options: [facility] - Get Care Now

## 2023-09-22 NOTE — Progress Notes (Signed)
 Virtual Visit Consent   James Camacho, you are scheduled for a virtual visit with a [facility] provider today. Just as with appointments in the office, your consent must be obtained to participate. Your consent will be active for this visit and any virtual visit you may have with one of our providers in the next 365 days. If you have a MyChart account, a copy of this consent can be sent to you electronically.  As this is a virtual visit, video technology does not allow for your provider to perform a traditional examination. This may limit your provider's ability to fully assess your condition. If your provider identifies any concerns that need to be evaluated in person or the need to arrange testing (such as labs, EKG, etc.), we will make arrangements to do so. Although advances in technology are sophisticated, we cannot ensure that it will always work on either your end or our end. If the connection with a video visit is poor, the visit may have to be switched to a telephone visit. With either a video or telephone visit, we are not always able to ensure that we have a secure connection.  By engaging in this virtual visit, you consent to the provision of healthcare and authorize for your insurance to be billed (if applicable) for the services provided during this visit. Depending on your insurance coverage, you may receive a charge related to this service.  I need to obtain your verbal consent now. Are you willing to proceed with your visit today? James Camacho has provided verbal consent on 09/22/2023 for a virtual visit (video or telephone). James CHRISTELLA Dickinson, PA-C  Date: 09/22/2023 10:08 AM   Virtual Visit via Video Note   I, James Camacho, connected with  James Camacho  ([id number], Jul 07, 1950) on 09/22/23 at 10:00 AM EDT by a video-enabled telemedicine application and verified that I am speaking with the correct person using two identifiers.  Location: Patient: Virtual Visit  Location Patient: Home Provider: Virtual Visit Location Provider: Home Office   I discussed the limitations of evaluation and management by telemedicine and the availability of in person appointments. The patient expressed understanding and agreed to proceed.    History of Present Illness: James Camacho is a 73 y.o. who identifies as a male who was assigned male at birth, and is being seen today for cough and congestion.  HPI: URI  This is a new problem. The current episode started 1 to 4 weeks ago (one week). The problem has been gradually worsening. There has been no fever. Associated symptoms include congestion, coughing (productive of thick discolored mucus), headaches, rhinorrhea (and post nasal drainage) and a sore throat (mild). Pertinent negatives include no chest pain, diarrhea, ear pain, nausea, plugged ear sensation, sinus pain, vomiting or wheezing. Treatments tried: antihistamine, tylenol . The treatment provided no relief.     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    Penicillin G Rash   Penicillins Rash   Medications:   Current Outpatient Medications:    azithromycin  (ZITHROMAX ) 250 MG tablet, Take 2 tablets on day 1, then 1 tablet daily on days 2 through 5, Disp: 6 tablet, Rfl: 0   benzonatate  (TESSALON ) 100 MG capsule, Take 1-2 capsules (100-200 mg total) by mouth 3 (three) times daily as needed., Disp: 30 capsule, Rfl: 0   Na Sulfate-K Sulfate-Mg Sulfate concentrate (SUPREP) 17.5-3.13-1.6 GM/177ML SOLN, Take as directed., Disp: 354 mL, Rfl: 0   amLODipine  (NORVASC ) 10 MG tablet, Take 1 tablet (10 mg total) by mouth daily., Disp: 90 tablet, Rfl: 3   aspirin  EC 81 MG tablet, Take 1 tablet (81 mg total) by mouth daily. Swallow whole., Disp: 1 tablet, Rfl: 0   atorvastatin  (LIPITOR) 10 MG tablet, TAKE 1 TABLET BY MOUTH DAILY, Disp: 90 tablet, Rfl: 3   carvedilol  (COREG ) 12.5 MG tablet, Take 1 tablet (12.5 mg total) by mouth 2 (two) times daily with a meal., Disp: 180 tablet, Rfl: 3   citalopram  (CELEXA ) 20 MG tablet, Take 1 tablet (20 mg total) by mouth daily., Disp: 90 tablet, Rfl: 3   dapagliflozin  propanediol (FARXIGA ) 5 MG TABS tablet, Take 1 tablet (5 mg total) by mouth daily before breakfast., Disp: 90 tablet, Rfl: 3   DULoxetine  (CYMBALTA ) 30 MG capsule, Take 30 mg by mouth daily., Disp: , Rfl:    erythromycin ophthalmic ointment, 1 Application., Disp: , Rfl:    furosemide  (LASIX ) 40 MG tablet, Take 1 tablet (40 mg total) by mouth daily., Disp: 90 tablet, Rfl: 3   ibuprofen (ADVIL) 800 MG tablet, Take 800 mg by mouth., Disp: , Rfl:    ketorolac  (ACULAR ) 0.5 % ophthalmic solution, SMARTSIG:In Eye(s), Disp: , Rfl:    lisinopril  (ZESTRIL ) 40 MG tablet, Take 1 tablet (40 mg total) by mouth daily., Disp: 90 tablet, Rfl: 3   metoprolol  tartrate (LOPRESSOR ) 100 MG tablet, Take 100 mg by mouth., Disp: , Rfl:    MIEBO 1.338 GM/ML SOLN, , Disp: , Rfl:    omeprazole  (PRILOSEC) 20 MG capsule, Take 1 capsule (20 mg total) by mouth daily., Disp: 90 capsule, Rfl: 3   ondansetron  (ZOFRAN -ODT) 4 MG disintegrating tablet,  Take 1 tablet (4 mg total) by mouth every 6 (six) hours as needed for nausea or vomiting. (Patient not taking: Reported on 08/09/2023), Disp: 30 tablet, Rfl: 0   prednisoLONE  acetate (PRED FORTE ) 1 % ophthalmic suspension, , Disp: , Rfl:    tadalafil  (CIALIS ) 20 MG tablet, TAKE ONE-HALF TO ONE TABLET BY MOUTH ONE TIME EVERY OTHER DAY AS NEEDED FOR ERECTILE DYSFUNCTION, Disp: 10 tablet, Rfl: 11   tirzepatide  (MOUNJARO ) 7.5 MG/0.5ML Pen, Inject 7.5 mg into the skin once a week., Disp: 2 mL, Rfl: 11   traMADol  (ULTRAM ) 50 MG tablet, Take 1 tablet (50 mg total) by mouth every 6 (six) hours as needed., Disp: 30 tablet, Rfl: 0  Observations/Objective: Patient is well-developed, well-nourished in no acute distress.  Resting comfortably at home.  Head is normocephalic, atraumatic.  No labored breathing.  Speech is clear and coherent with logical content.  Patient is alert and oriented at baseline.  Assessment and Plan: 1. Bacterial upper respiratory infection (Primary) - azithromycin  (ZITHROMAX ) 250 MG tablet; Take 2 tablets on day 1, then 1 tablet daily on days 2 through 5  Dispense: 6 tablet; Refill: 0 - benzonatate  (TESSALON ) 100 MG capsule; Take 1-2 capsules (100-200 mg total) by mouth 3 (three) times daily as needed.  Dispense: 30 capsule; Refill: 0  - Worsening over a week despite OTC medications - Will treat with Z-pack and tessalon  perles - Can add Mucinex plain - Push fluids.  - Rest.  - Steam and humidifier can help - Seek in person evaluation if worsening or symptoms fail to improve    Follow Up Instructions: I discussed the assessment and treatment plan with the patient. The patient was provided an opportunity to ask questions and all were answered. The patient agreed with the plan and demonstrated an understanding of the instructions.  A copy of instructions were sent to the patient via MyChart unless otherwise noted below.    The patient was advised to call back or seek an  in-person evaluation if the symptoms worsen or if the condition fails to improve as anticipated.    James CHRISTELLA Dickinson, PA-C

## 2023-09-24 ENCOUNTER — Telehealth: Admitting: Family

## 2023-09-24 DIAGNOSIS — J029 Acute pharyngitis, unspecified: Secondary | ICD-10-CM

## 2023-09-24 MED ORDER — CLINDAMYCIN HCL 300 MG PO CAPS: 300.0000 mg | ORAL_CAPSULE | Freq: Three times a day (TID) | ORAL | 0 refills | Status: AC

## 2023-09-24 NOTE — Progress Notes (Signed)
E-Visit for Sore Throat - Strep Symptoms  We are sorry that you are not feeling well.  Here is how we plan to help!  Based on what you have shared with me it is likely that you have strep pharyngitis.  Strep pharyngitis is inflammation and infection in the back of the throat.  This is an infection cause by bacteria and is treated with antibiotics.  I have prescribed Clindamycin 300 mg three times a day for 10 days. For throat pain, we recommend over the counter oral pain relief medications such as acetaminophen or aspirin, or anti-inflammatory medications such as ibuprofen or naproxen sodium. Topical treatments such as oral throat lozenges or sprays may be used as needed. Strep infections are not as easily transmitted as other respiratory infections, however we still recommend that you avoid close contact with loved ones, especially the very young and elderly.  Remember to wash your hands thoroughly throughout the day as this is the number one way to prevent the spread of infection and wipe down door knobs and counters with disinfectant.   Home Care: Only take medications as instructed by your medical team. Complete the entire course of an antibiotic. Do not take these medications with alcohol. A steam or ultrasonic humidifier can help congestion.  You can place a towel over your head and breathe in the steam from hot water coming from a faucet. Avoid close contacts especially the very young and the elderly. Cover your mouth when you cough or sneeze. Always remember to wash your hands.  Get Help Right Away If: You develop worsening fever or sinus pain. You develop a severe head ache or visual changes. Your symptoms persist after you have completed your treatment plan.  Make sure you Understand these instructions. Will watch your condition. Will get help right away if you are not doing well or get worse.   Thank you for choosing an e-visit.  Your e-visit answers were reviewed by a board  certified advanced clinical practitioner to complete your personal care plan. Depending upon the condition, your plan could have included both over the counter or prescription medications.  Please review your pharmacy choice. Make sure the pharmacy is open so you can pick up prescription now. If there is a problem, you may contact your provider through MyChart messaging and have the prescription routed to another pharmacy.  Your safety is important to us. If you have drug allergies check your prescription carefully.   For the next 24 hours you can use MyChart to ask questions about today's visit, request a non-urgent call back, or ask for a work or school excuse. You will get an email in the next two days asking about your experience. I hope that your e-visit has been valuable and will speed your recovery.  Approximately 5 minutes was spent documenting and reviewing patient's chart.    

## 2023-09-25 ENCOUNTER — Telehealth (HOSPITAL_COMMUNITY): Payer: Self-pay

## 2023-09-25 NOTE — Telephone Encounter (Signed)
 Pt will need to be rescheduled JMP

## 2023-09-25 NOTE — Telephone Encounter (Signed)
 I have spoken to patient to advise that unfortunately, we must cancel his 09/28/23 colonoscopy at the hospital. I do not currently have a schedule available for October but advised as I do, I will be back in touch with him to reschedule. He verbalizes understanding.

## 2023-09-25 NOTE — Telephone Encounter (Signed)
 James Camacho was scheduled for Colonoscopy with Dr. Albertus on 09/28/23, at [facility]   Patient was called on 7/21 to confirm appointment for 7/24. During that call, pt stated he is experiencing respiratory infection / chest cold and has been placed on antibiotics. Pt inquired as to what this means for his procedure. Pt was seen via E-visit yesterday (7/20) for presumptive strep pharyngitis. Advised patient that we would need to cancel his procedure for Thursday.  Dr Albertus & office notified. Patient instructed to call physician's office to reschedule their procedure. Patient demonstrated understanding.

## 2023-09-26 DIAGNOSIS — Z96612 Presence of left artificial shoulder joint: Secondary | ICD-10-CM | POA: Diagnosis not present

## 2023-09-26 DIAGNOSIS — M6281 Muscle weakness (generalized): Secondary | ICD-10-CM | POA: Diagnosis not present

## 2023-09-28 ENCOUNTER — Ambulatory Visit (HOSPITAL_COMMUNITY): Admit: 2023-09-28 | Admitting: Internal Medicine

## 2023-09-28 ENCOUNTER — Encounter (HOSPITAL_COMMUNITY): Payer: Self-pay

## 2023-09-28 DIAGNOSIS — Z96612 Presence of left artificial shoulder joint: Secondary | ICD-10-CM | POA: Diagnosis not present

## 2023-09-28 DIAGNOSIS — M25512 Pain in left shoulder: Secondary | ICD-10-CM | POA: Diagnosis not present

## 2023-09-28 SURGERY — COLONOSCOPY
Anesthesia: Monitor Anesthesia Care

## 2023-10-03 ENCOUNTER — Encounter: Payer: Self-pay | Admitting: Internal Medicine

## 2023-10-03 DIAGNOSIS — Z96612 Presence of left artificial shoulder joint: Secondary | ICD-10-CM | POA: Diagnosis not present

## 2023-10-03 DIAGNOSIS — M6281 Muscle weakness (generalized): Secondary | ICD-10-CM | POA: Diagnosis not present

## 2023-10-04 MED ORDER — TIRZEPATIDE 10 MG/0.5ML ~~LOC~~ SOAJ: 10.0000 mg | SUBCUTANEOUS | 3 refills | Status: AC

## 2023-10-05 DIAGNOSIS — M25512 Pain in left shoulder: Secondary | ICD-10-CM | POA: Diagnosis not present

## 2023-10-05 DIAGNOSIS — Z96612 Presence of left artificial shoulder joint: Secondary | ICD-10-CM | POA: Diagnosis not present

## 2023-10-11 DIAGNOSIS — Z96612 Presence of left artificial shoulder joint: Secondary | ICD-10-CM | POA: Diagnosis not present

## 2023-10-11 DIAGNOSIS — M6281 Muscle weakness (generalized): Secondary | ICD-10-CM | POA: Diagnosis not present

## 2023-10-16 ENCOUNTER — Telehealth: Admitting: Physician Assistant

## 2023-10-16 DIAGNOSIS — J069 Acute upper respiratory infection, unspecified: Secondary | ICD-10-CM | POA: Diagnosis not present

## 2023-10-16 MED ORDER — BENZONATATE 100 MG PO CAPS: 100.0000 mg | ORAL_CAPSULE | Freq: Three times a day (TID) | ORAL | 0 refills | Status: DC | PRN

## 2023-10-16 MED ORDER — FLUTICASONE PROPIONATE 50 MCG/ACT NA SUSP: 2.0000 | Freq: Every day | NASAL | 0 refills | Status: DC

## 2023-10-16 NOTE — Progress Notes (Signed)
 E-Visit for [employer] Virus / COVID Screening  Your current symptoms could be consistent with COVID.  Please complete a Covid test either at home or check with your local pharmacy to see if they provide testing.    If you have tested positive for COVID-19, meaning that you were infected with the novel coronavirus and could give the virus to others.  Most people with COVID-19 have mild illness and can recover at home without medical care. Do not leave your home, except to get medical care. Do not visit public areas and do not go to places where you are unable to wear a mask. It is important that you stay home  to take care for yourself and to help protect other people in your home and community.      Isolation Instructions:   You are to isolate at home until you have been fever free for at least 24 hours without a fever-reducing medication, and symptoms have been steadily improving for 24 hours. At that time,  you can end isolation but need to mask for an additional 5 days.  If you must be around other household members who do not have symptoms, you need to make sure that both you and the family members are masking consistently with a high-quality mask.  If you note any worsening of symptoms despite treatment, please seek an in-person evaluation ASAP. If you note any significant shortness of breath or any chest pain, please seek ER evaluation. Please do not delay care!  Go to the nearest hospital ED for assessment if fever/cough/breathlessness are severe or illness seems like a threat to life.    The following symptoms may appear 2-14 days after exposure: Fever Cough Shortness of breath or difficulty breathing Chills Repeated shaking with chills Muscle pain Headache Sore throat New loss of taste or smell Fatigue Congestion or runny nose Nausea or vomiting Diarrhea  You can use medication such as I have prescribed Tessalon  Perles 100 mg. You may take 1-2 capsules every 8 hours as needed for  cough and I have prescribed Fluticasone  nasal spray 2 sprays in each nostril one time per dayasal spray 2 sprays in each nostril one time per day  You may also take acetaminophen  (Tylenol ) as needed for fever.  HOME CARE Only take medications as instructed by your medical team. Drink plenty of fluids and get plenty of rest. A steam or ultrasonic humidifier can help if you have congestion.  GET HELP RIGHT AWAY IF YOU HAVE EMERGENCY WARNING SIGNS.  Call 911 or proceed to your closest emergency facility if: You develop worsening high fever. Trouble breathing Bluish lips or face Persistent pain or pressure in the chest New confusion Inability to wake or stay awake You cough up blood. Your symptoms become more severe Inability to hold down food or fluids  This list is not all possible symptoms. Contact your medical provider for any symptoms that are severe or concerning to you.   Your e-visit answers were reviewed by a board certified advanced clinical practitioner to complete your personal care plan.  Depending on the condition, your plan could have included both over the counter or prescription medications.  If there is a problem, please reply once you have received a response from your provider.  Your safety is important to us .  If you have drug allergies check your prescription carefully.    You can use MyChart to ask questions about today's visit, request a non-urgent call back, or ask for a work  or school excuse for 24 hours related to this e-Visit. If it has been greater than 24 hours you will need to follow up with your provider or enter a new e-Visit to address those concerns. You will get an e-mail in the next two days asking about your experience.  I hope that your e-visit has been valuable and will speed your recovery. Thank you for using e-visits.    I have spent 5 minutes in review of e-visit questionnaire, review and updating patient chart, medical decision making and  response to patient.   Delon CHRISTELLA Dickinson, PA-C

## 2023-10-26 ENCOUNTER — Telehealth: Admitting: Physician Assistant

## 2023-10-26 DIAGNOSIS — M549 Dorsalgia, unspecified: Secondary | ICD-10-CM

## 2023-10-26 DIAGNOSIS — R2689 Other abnormalities of gait and mobility: Secondary | ICD-10-CM

## 2023-10-26 NOTE — Progress Notes (Signed)
  Because of severity of pain despite use of Tramadol  and limited range of motion requiring need for exam, I feel your condition warrants further evaluation and I recommend that you be seen in a face-to-face visit.   NOTE: There will be NO CHARGE for this E-Visit   If you are having a true medical emergency, please call 911.     For an urgent face to face visit, [place] has multiple urgent care centers for your convenience.  Click the link below for the full list of locations and hours, walk-in wait times, appointment scheduling options and driving directions:  Urgent Care - [place], [place], [place], [place], [place], [place]  [place]     Your MyChart E-visit questionnaire answers were reviewed by a board certified advanced clinical practitioner to complete your personal care plan based on your specific symptoms.    Thank you for using e-Visits.

## 2023-11-01 ENCOUNTER — Other Ambulatory Visit: Payer: Self-pay | Admitting: *Deleted

## 2023-11-01 ENCOUNTER — Telehealth: Payer: Self-pay | Admitting: *Deleted

## 2023-11-01 DIAGNOSIS — Z961 Presence of intraocular lens: Secondary | ICD-10-CM | POA: Diagnosis not present

## 2023-11-01 DIAGNOSIS — Z947 Corneal transplant status: Secondary | ICD-10-CM | POA: Diagnosis not present

## 2023-11-01 DIAGNOSIS — H35351 Cystoid macular degeneration, right eye: Secondary | ICD-10-CM | POA: Diagnosis not present

## 2023-11-01 DIAGNOSIS — Z860100 Personal history of colon polyps, unspecified: Secondary | ICD-10-CM

## 2023-11-01 NOTE — Telephone Encounter (Signed)
 Contacted patient to reschedule his colonoscopy previously set for 09/28/23 (patient had bacterial upper respiratory infection). Patient has scheduled next available colonoscopy at [facility] on 01/18/24. He has scheduled for 11:15 am care, 9:45 am arrival (asked for latest morning appointment as he is his wifes primary caregiver. She has dementia and it is easier to get coverage for her later in the morning/day). He has also scheduled an in person previsit for 01/01/24 at 1 pm. He already has Suprep kit at home but just needs updated on prep information.

## 2023-11-02 DIAGNOSIS — Z96612 Presence of left artificial shoulder joint: Secondary | ICD-10-CM | POA: Diagnosis not present

## 2023-11-02 DIAGNOSIS — M25512 Pain in left shoulder: Secondary | ICD-10-CM | POA: Diagnosis not present

## 2023-11-07 DIAGNOSIS — M6281 Muscle weakness (generalized): Secondary | ICD-10-CM | POA: Diagnosis not present

## 2023-11-07 DIAGNOSIS — Z96612 Presence of left artificial shoulder joint: Secondary | ICD-10-CM | POA: Diagnosis not present

## 2023-11-09 DIAGNOSIS — M25512 Pain in left shoulder: Secondary | ICD-10-CM | POA: Diagnosis not present

## 2023-11-09 DIAGNOSIS — Z96612 Presence of left artificial shoulder joint: Secondary | ICD-10-CM | POA: Diagnosis not present

## 2023-11-14 DIAGNOSIS — M25512 Pain in left shoulder: Secondary | ICD-10-CM | POA: Diagnosis not present

## 2023-11-14 DIAGNOSIS — Z96612 Presence of left artificial shoulder joint: Secondary | ICD-10-CM | POA: Diagnosis not present

## 2023-11-29 ENCOUNTER — Encounter: Payer: Self-pay | Admitting: Internal Medicine

## 2023-12-01 ENCOUNTER — Telehealth: Payer: Self-pay

## 2023-12-01 MED ORDER — DAPAGLIFLOZIN PROPANEDIOL 5 MG PO TABS: 5.0000 mg | ORAL_TABLET | Freq: Every day | ORAL | 2 refills | Status: AC

## 2023-12-01 NOTE — Telephone Encounter (Signed)
 Rx sent electronically.

## 2023-12-18 ENCOUNTER — Encounter: Payer: Self-pay | Admitting: Physician Assistant

## 2023-12-18 ENCOUNTER — Telehealth: Admitting: Physician Assistant

## 2023-12-18 DIAGNOSIS — F32A Depression, unspecified: Secondary | ICD-10-CM | POA: Diagnosis not present

## 2023-12-18 DIAGNOSIS — F430 Acute stress reaction: Secondary | ICD-10-CM

## 2023-12-18 DIAGNOSIS — F419 Anxiety disorder, unspecified: Secondary | ICD-10-CM | POA: Diagnosis not present

## 2023-12-18 MED ORDER — HYDROXYZINE PAMOATE 25 MG PO CAPS: 25.0000 mg | ORAL_CAPSULE | Freq: Three times a day (TID) | ORAL | 0 refills | Status: AC | PRN

## 2023-12-18 MED ORDER — CITALOPRAM HYDROBROMIDE 40 MG PO TABS: 40.0000 mg | ORAL_TABLET | Freq: Every day | ORAL | 1 refills | Status: DC

## 2023-12-18 NOTE — Patient Instructions (Addendum)
 James Camacho, thank you for joining James Velma Lunger, PA-C for today's virtual visit.  While this provider is not your primary care provider (PCP), if your PCP is located in our provider database this encounter information will be shared with them immediately following your visit.   A [facility] MyChart account gives you access to today's visit and all your visits, tests, and labs performed at [facility]  click here if you don't have a [facility] MyChart account or go to [email or web address]  Consent: (Patient) James Camacho provided verbal consent for this virtual visit at the beginning of the encounter.  Current Medications:  Current Outpatient Medications:    Na Sulfate-K Sulfate-Mg Sulfate concentrate (SUPREP) 17.5-3.13-1.6 GM/177ML SOLN, Take as directed., Disp: 354 mL, Rfl: 0   amLODipine  (NORVASC ) 10 MG tablet, Take 1 tablet (10 mg total) by mouth daily., Disp: 90 tablet, Rfl: 3   aspirin  EC 81 MG tablet, Take 1 tablet (81 mg total) by mouth daily. Swallow whole., Disp: 1 tablet, Rfl: 0   atorvastatin  (LIPITOR) 10 MG tablet, TAKE 1 TABLET BY MOUTH DAILY, Disp: 90 tablet, Rfl: 3   benzonatate  (TESSALON ) 100 MG capsule, Take 1-2 capsules (100-200 mg total) by mouth 3 (three) times daily as needed., Disp: 30 capsule, Rfl: 0   carvedilol  (COREG ) 12.5 MG tablet, Take 1 tablet (12.5 mg total) by mouth 2 (two) times daily with a meal., Disp: 180 tablet, Rfl: 3   citalopram  (CELEXA ) 20 MG tablet, Take 1 tablet (20 mg total) by mouth daily., Disp: 90 tablet, Rfl: 3   dapagliflozin  propanediol (FARXIGA ) 5 MG TABS tablet, Take 1 tablet (5 mg total) by mouth daily before breakfast., Disp: 90 tablet, Rfl: 2   DULoxetine  (CYMBALTA ) 30 MG capsule, Take 30 mg by mouth daily., Disp: , Rfl:    erythromycin ophthalmic ointment, 1 Application., Disp: , Rfl:    fluticasone  (FLONASE ) 50 MCG/ACT nasal spray, Place 2 sprays into both nostrils daily., Disp: 16 g, Rfl: 0    furosemide  (LASIX ) 40 MG tablet, Take 1 tablet (40 mg total) by mouth daily., Disp: 90 tablet, Rfl: 3   ibuprofen (ADVIL) 800 MG tablet, Take 800 mg by mouth., Disp: , Rfl:    ketorolac  (ACULAR ) 0.5 % ophthalmic solution, SMARTSIG:In Eye(s), Disp: , Rfl:    lisinopril  (ZESTRIL ) 40 MG tablet, Take 1 tablet (40 mg total) by mouth daily., Disp: 90 tablet, Rfl: 3   metoprolol  tartrate (LOPRESSOR ) 100 MG tablet, Take 100 mg by mouth., Disp: , Rfl:    MIEBO 1.338 GM/ML SOLN, , Disp: , Rfl:    omeprazole  (PRILOSEC) 20 MG capsule, Take 1 capsule (20 mg total) by mouth daily., Disp: 90 capsule, Rfl: 3   ondansetron  (ZOFRAN -ODT) 4 MG disintegrating tablet, Take 1 tablet (4 mg total) by mouth every 6 (six) hours as needed for nausea or vomiting. (Patient not taking: Reported on 08/09/2023), Disp: 30 tablet, Rfl: 0   prednisoLONE  acetate (PRED FORTE ) 1 % ophthalmic suspension, , Disp: , Rfl:    tadalafil  (CIALIS ) 20 MG tablet, TAKE ONE-HALF TO ONE TABLET BY MOUTH ONE TIME EVERY OTHER DAY AS NEEDED FOR ERECTILE DYSFUNCTION, Disp: 10 tablet, Rfl: 11   tirzepatide  (MOUNJARO ) 10 MG/0.5ML Pen, Inject 10 mg into the skin once a week., Disp: 6 mL, Rfl: 3   traMADol  (ULTRAM ) 50 MG tablet, Take 1 tablet (50 mg total) by mouth every 6 (six) hours as needed., Disp: 30 tablet, Rfl: 0   Medications ordered in this encounter:  No orders  of the defined types were placed in this encounter.    *If you need refills on other medications prior to your next appointment, please contact your pharmacy*  Follow-Up: Call back or seek an in-person evaluation if the symptoms worsen or if the condition fails to improve as anticipated.  [facility] [phone]  Other Instructions Please hydrate. Try to keep a well-balanced diet. See the resources below for counseling. Start the new dose of Citalopram  (40 mg) daily. Take the hydroxyzine as directed, when needed, for more acute anxiety. Please call your PCP office  tomorrow to schedule a follow-up within 2 weeks. If you note any non-resolving, new, or worsening symptoms despite treatment, please seek an in-person evaluation ASAP.  Counseling Services:  [facility] [phone]  [facility] - [phone]  [facility] -- [phone]  [facility] -- [phone]  [facility] -- [phone]  [facility] -- [phone]  [facility] - [phone]  [facility] -- [phone]  [facility] [place], [facility] - [phone]  [facility]  [facility] -- [phone]  Mental Health Resources  Emergency Numbers:  70 Suicide and Crisis Lifeline (Formerly National Suicide Prevention Lifeline)  O Call or SMS text 988  O For Deaf or Hard-of-Hearing-- Use preferred relay service or dial 711 and then  988  O For Spanish-speaking patients -- I -[phone]  [employer] or SMS text [phone]  Disaster Distress Robin [facility] Call,Text [phone]  National Domestic Violence Hotline  O call 202 093 9090 or Text START to (636) 779-8179  National Sexual Assault Hotline  o call (667)565-1132      If you have been instructed to have an in-person evaluation today at a local Urgent Care facility, please use the link below. It will take you to a list of all of our available [facility]gent [facility], including address, phone number and hours of operation. Please do not delay care.  [facility]  If you or a family member do not have a primary care provider, use the link below to schedule a visit and establish care. When you choose a [facility] primary care physician or advanced practice provider, you gain a long-term partner in health. Find a Primary Care Provider  Learn more about [facility]'s in-office and virtual care  options: [facility] - Get Care Now

## 2023-12-18 NOTE — Progress Notes (Signed)
 Virtual Visit Consent   James Camacho, you are scheduled for a virtual visit with a [facility] provider today. Just as with appointments in the office, your consent must be obtained to participate. Your consent will be active for this visit and any virtual visit you may have with one of our providers in the next 365 days. If you have a MyChart account, a copy of this consent can be sent to you electronically.  As this is a virtual visit, video technology does not allow for your provider to perform a traditional examination. This may limit your provider's ability to fully assess your condition. If your provider identifies any concerns that need to be evaluated in person or the need to arrange testing (such as labs, EKG, etc.), we will make arrangements to do so. Although advances in technology are sophisticated, we cannot ensure that it will always work on either your end or our end. If the connection with a video visit is poor, the visit may have to be switched to a telephone visit. With either a video or telephone visit, we are not always able to ensure that we have a secure connection.  By engaging in this virtual visit, you consent to the provision of healthcare and authorize for your insurance to be billed (if applicable) for the services provided during this visit. Depending on your insurance coverage, you may receive a charge related to this service.  I need to obtain your verbal consent now. Are you willing to proceed with your visit today? Yuan Gann has provided verbal consent on 12/18/2023 for a virtual visit (video or telephone). James Camacho, [place]  Date: 12/18/2023 6:54 PM   Virtual Visit via Video Note   I, James Camacho, connected with  James Camacho  ([id number], Nov 03, 1950) on 12/18/23 at  5:45 PM EDT by a video-enabled telemedicine application and verified that I am speaking with the correct person using two identifiers.  Location: Patient: Virtual  Visit Location Patient: Home Provider: Virtual Visit Location Provider: Home Office   I discussed the limitations of evaluation and management by telemedicine and the availability of in person appointments. The patient expressed understanding and agreed to proceed.    History of Present Illness: Vidal Lampkins is a 73 y.o. who identifies as a male who was assigned male at birth, and is being seen today for increasing anxiety levels over the past month, after the recent passing of his wife, for whom he was the primary caregiver for over the past few years due to her dementia. Notes some increased stressors handling legal issues with the facility she was placed in previously, as well as other family stressors that are also increasing level of anxiety. Does note depressed mood without anhedonia. Denies big change in appetite. Mild change with sleep. Denies SI/HI. Is currently on Citalopram  20 mg daily from his PCP who recently retired. Is awaiting appointment with replacement PCP.  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    Penicillin G  Rash   Penicillins Rash   Medications:  Current Outpatient Medications:    citalopram  (CELEXA ) 40 MG tablet, Take 1 tablet (40 mg total) by mouth daily., Disp: 30 tablet, Rfl: 1   hydrOXYzine (VISTARIL) 25 MG capsule, Take 1 capsule (25 mg total) by mouth every 8 (eight) hours as needed., Disp: 15 capsule, Rfl: 0   Na Sulfate-K Sulfate-Mg Sulfate concentrate (SUPREP) 17.5-3.13-1.6 GM/177ML SOLN, Take as directed., Disp: 354 mL, Rfl: 0   amLODipine  (NORVASC ) 10 MG tablet, Take 1 tablet (10 mg total) by mouth daily., Disp: 90 tablet, Rfl: 3   aspirin  EC 81 MG tablet, Take 1 tablet (81 mg total) by mouth daily. Swallow whole., Disp: 1 tablet, Rfl: 0   atorvastatin  (LIPITOR) 10 MG tablet, TAKE 1 TABLET BY MOUTH DAILY, Disp: 90 tablet, Rfl: 3   carvedilol  (COREG ) 12.5 MG tablet, Take 1 tablet (12.5 mg total) by mouth 2 (two) times daily with a meal., Disp: 180 tablet, Rfl: 3   dapagliflozin  propanediol (FARXIGA ) 5 MG TABS tablet, Take 1 tablet (5 mg total) by mouth daily before breakfast., Disp: 90 tablet, Rfl: 2   DULoxetine  (CYMBALTA ) 30 MG capsule, Take 30 mg by mouth daily., Disp: , Rfl:    erythromycin ophthalmic ointment, 1 Application., Disp: , Rfl:    furosemide  (LASIX ) 40 MG tablet, Take 1 tablet (40 mg total) by mouth daily., Disp: 90 tablet, Rfl: 3   ketorolac  (ACULAR ) 0.5 % ophthalmic solution, SMARTSIG:In Eye(s), Disp: , Rfl:    lisinopril  (ZESTRIL ) 40 MG tablet, Take 1 tablet (40 mg total) by mouth daily., Disp: 90 tablet, Rfl: 3   metoprolol  tartrate (LOPRESSOR ) 100 MG tablet, Take 100 mg by mouth., Disp: , Rfl:    MIEBO 1.338 GM/ML SOLN, , Disp: , Rfl:    omeprazole  (PRILOSEC) 20 MG capsule, Take 1 capsule (20 mg total) by mouth daily., Disp: 90 capsule, Rfl: 3   prednisoLONE  acetate (PRED FORTE ) 1 % ophthalmic suspension, , Disp: , Rfl:    tadalafil  (CIALIS ) 20 MG tablet, TAKE ONE-HALF TO ONE TABLET BY MOUTH ONE TIME EVERY OTHER DAY AS NEEDED FOR ERECTILE DYSFUNCTION, Disp: 10  tablet, Rfl: 11   tirzepatide  (MOUNJARO ) 10 MG/0.5ML Pen, Inject 10 mg into the skin once a week., Disp: 6 mL, Rfl: 3   traMADol  (ULTRAM ) 50 MG tablet, Take 1 tablet (50 mg total) by mouth every 6 (six) hours as needed., Disp: 30 tablet, Rfl: 0  Observations/Objective: Patient is well-developed, well-nourished in no acute distress.  Resting comfortably at home.  Head is normocephalic, atraumatic.  No labored breathing. Speech is clear and coherent with logical content.  Patient is alert and oriented at baseline.   Assessment and Plan: 1. Anxiety and depression (Primary) - hydrOXYzine (VISTARIL) 25 MG capsule; Take 1 capsule (25 mg total) by mouth every 8 (eight) hours as needed.  Dispense: 15 capsule; Refill: 0 - citalopram  (CELEXA ) 40 MG tablet; Take 1 tablet (40 mg total) by mouth daily.  Dispense: 30 tablet; Refill: 1  2. Acute stress reaction - hydrOXYzine (VISTARIL) 25 MG capsule; Take 1  capsule (25 mg total) by mouth every 8 (eight) hours as needed.  Dispense: 15 capsule; Refill: 0  History of Anxiety now with acute stress reaction and increased anxiety. Supportive measures and counseling resources reviewed. Since he has been on Citalopram  20 mg from some time and tolerating well, will increase to 40 mg daily. Will give low-dose Hydroxyzine as needed for panic as we cannot prescribe other acute anxiolytics via virtual urgent care. He is to call PCP office to schedule follow-up visit with one of the providers there within 2 weeks. ER for any acutely worsening symptoms.  Follow Up Instructions: I discussed the assessment and treatment plan with the patient. The patient was provided an opportunity to ask questions and all were answered. The patient agreed with the plan and demonstrated an understanding of the instructions.  A copy of instructions were sent to the patient via MyChart unless otherwise noted below.   The patient was advised to call back or seek an in-person evaluation if the  symptoms worsen or if the condition fails to improve as anticipated.    James Velma Lunger, PA-C

## 2023-12-20 DIAGNOSIS — H35351 Cystoid macular degeneration, right eye: Secondary | ICD-10-CM | POA: Diagnosis not present

## 2023-12-20 DIAGNOSIS — H04123 Dry eye syndrome of bilateral lacrimal glands: Secondary | ICD-10-CM | POA: Diagnosis not present

## 2023-12-20 DIAGNOSIS — Z947 Corneal transplant status: Secondary | ICD-10-CM | POA: Diagnosis not present

## 2023-12-27 ENCOUNTER — Telehealth: Payer: Self-pay | Admitting: *Deleted

## 2023-12-27 NOTE — Telephone Encounter (Signed)
 Team,  This pt is a documented difficult intubation and his procedure will need to be done at the hospital.   Thanks,  Cathryn Cobb

## 2023-12-27 NOTE — Telephone Encounter (Signed)
 Chart reviewed. Pts procedure is scheduled with Dr. Albertus at [facility].   Thank you

## 2024-01-01 ENCOUNTER — Encounter

## 2024-01-02 ENCOUNTER — Ambulatory Visit (AMBULATORY_SURGERY_CENTER)

## 2024-01-02 VITALS — Ht 68.5 in | Wt 215.0 lb

## 2024-01-02 DIAGNOSIS — Z860100 Personal history of colon polyps, unspecified: Secondary | ICD-10-CM

## 2024-01-02 NOTE — Progress Notes (Signed)
 No egg or soy allergy known to patient  No issues known to pt with past sedation with any surgeries or procedures Patient denies ever being told they had issues or difficulty with intubation  No FH of Malignant Hyperthermia Pt is not on diet pills Pt is not on  home 02  Pt is not on blood thinners  Pt denies issues with constipation  No A fib or A flutter Have any cardiac testing pending-- no  LOA: independent  Prep: suprep   [facility] hx of difficult intubation  PV completed with patient. Prep instructions sent via mychart and home address.

## 2024-01-03 ENCOUNTER — Encounter: Payer: Self-pay | Admitting: Internal Medicine

## 2024-01-10 ENCOUNTER — Telehealth: Payer: Self-pay | Admitting: Gastroenterology

## 2024-01-10 NOTE — Telephone Encounter (Addendum)
 Procedure:Colonoscopy Procedure date: 01/18/24 Procedure location: [facility]  Arrival Time: 9:55 am Spoke with the patient Y/N: Yes Any prep concerns? No Has the patient obtained the prep from the pharmacy ? Yes Do you have a care partner and transportation: Yes Any additional concerns? No

## 2024-01-12 ENCOUNTER — Other Ambulatory Visit (HOSPITAL_COMMUNITY): Payer: Self-pay

## 2024-01-12 ENCOUNTER — Telehealth: Payer: Self-pay

## 2024-01-12 NOTE — Telephone Encounter (Signed)
 Was a patient of Dr Jimmy. Sent to all provider pools because he doesn't have a new provider listed at clinic.  Pharmacy Patient Advocate Encounter  Received notification from [employer] that Prior Authorization for Mounjaro  10 has been APPROVED from 01/12/24 to 01/11/25. Ran test claim, Copay is $0.00. This test claim was processed through [facility]- copay amounts may vary at other pharmacies due to pharmacy/plan contracts, or as the patient moves through the different stages of their insurance plan.   PA #/Case ID/Reference #: # [id number]

## 2024-01-12 NOTE — Telephone Encounter (Signed)
 Clinical questions have been answered and PA submitted. PA currently Pending. Please be advised that most companies allow up to 30 days to make a decision. We will advise when a determination has been made, or follow up in 1 week.   Please reach out to our team, Rx Prior Auth Pool, if you haven't heard back in a week.

## 2024-01-12 NOTE — Telephone Encounter (Signed)
 Pharmacy Patient Advocate Encounter   Received notification from Onbase that prior authorization for Mounjaro  10 is required/requested.   Insurance verification completed.   The patient is insured through [facility] [employer]VANCE.   Per test claim: PA required; PA started via CoverMyMeds. KEY [id number] . Waiting for clinical questions to populate.

## 2024-01-16 NOTE — Telephone Encounter (Signed)
 Called patient he is aware of approval. He will reach out if any questions.

## 2024-01-17 ENCOUNTER — Telehealth: Payer: Self-pay | Admitting: Internal Medicine

## 2024-01-17 NOTE — Telephone Encounter (Signed)
 I contacted [facility] and spoke to [place] who says they can accommodate patient at 3:30 or 4 pm today for colonoscopy. Patient would need to arrive at 2:30 pm.  I have spoken to patient to ask if he could come back with a care partner to [facility] at 230 pm today for colonoscopy. Patient states I just ate lunch because I had just gotten so weak. Therefore, patient will be unable to have colonoscopy today OR at his originally scheduled colonoscopy time tomorrow, 01/18/24.

## 2024-01-17 NOTE — Telephone Encounter (Signed)
 I have spoken to patient again and offered hospital colonoscopy reschedule for 03/11/24 at 730 am, 600 am arrival. Patient states that he needs a later time since his transportation does not get up that early. States he will call back at the beginning of the year to reschedule his procedure at the hospital.  Patient will likely benefit from another previsit prior to any rescheduling of colonoscopy.

## 2024-01-17 NOTE — Telephone Encounter (Signed)
 Inbound call from patient stating that he thought his procedure was schedule for today when it was actual scheduled for tomorrow. Patient is wanting to know if he can be schedule in for today. Please advise.

## 2024-01-17 NOTE — Telephone Encounter (Signed)
 Dr Albertus-  Patient is scheduled for tomorrow, 01/18/24 at 1125 am at [facility] for colonoscopy. Patient apparently thought his colonoscopy was today so he prepped yesterday. Is he ok to just remain on clear liquids only today and keep his scheduled procedure for tomorrow?

## 2024-01-17 NOTE — Telephone Encounter (Signed)
 Should be okay, but I will check with hospital team to see if any availability today, he should remain NPO

## 2024-01-18 ENCOUNTER — Ambulatory Visit (HOSPITAL_COMMUNITY): Admission: RE | Admit: 2024-01-18 | Source: Home / Self Care | Admitting: Internal Medicine

## 2024-01-18 ENCOUNTER — Encounter (HOSPITAL_COMMUNITY): Admission: RE | Payer: Self-pay | Source: Home / Self Care

## 2024-01-18 SURGERY — COLONOSCOPY
Anesthesia: Monitor Anesthesia Care

## 2024-02-14 DIAGNOSIS — Z96612 Presence of left artificial shoulder joint: Secondary | ICD-10-CM | POA: Diagnosis not present

## 2024-02-21 ENCOUNTER — Other Ambulatory Visit: Payer: Self-pay

## 2024-02-21 DIAGNOSIS — F419 Anxiety disorder, unspecified: Secondary | ICD-10-CM

## 2024-02-21 MED ORDER — CITALOPRAM HYDROBROMIDE 40 MG PO TABS: 40.0000 mg | ORAL_TABLET | Freq: Every day | ORAL | 0 refills | Status: AC

## 2024-02-21 NOTE — Telephone Encounter (Signed)
 Pt needs a sooner TOC than the appt he has in May 2026. Thank you.

## 2024-02-23 NOTE — Telephone Encounter (Signed)
 Called and schedule pt for toc

## 2024-02-27 ENCOUNTER — Telehealth: Admitting: Physician Assistant

## 2024-02-27 DIAGNOSIS — J069 Acute upper respiratory infection, unspecified: Secondary | ICD-10-CM

## 2024-02-27 MED ORDER — BENZONATATE 200 MG PO CAPS: 200.0000 mg | ORAL_CAPSULE | Freq: Three times a day (TID) | ORAL | 0 refills | Status: AC | PRN

## 2024-02-27 MED ORDER — FLUTICASONE PROPIONATE 50 MCG/ACT NA SUSP: NASAL | 6 refills | Status: AC

## 2024-02-27 NOTE — Patient Instructions (Signed)
 " James Camacho, thank you for joining James Dicenso, PA-C for today's virtual visit.  While this provider is not your primary care provider (PCP), if your PCP is located in our provider database this encounter information will be shared with them immediately following your visit.   A [facility] MyChart account gives you access to today's visit and all your visits, tests, and labs performed at [facility]  click here if you don't have a [facility] MyChart account or go to [email or web address]  Consent: (Patient) James Camacho provided verbal consent for this virtual visit at the beginning of the encounter.  Current Medications:  Current Outpatient Medications:    benzonatate  (TESSALON ) 200 MG capsule, Take 1 capsule (200 mg total) by mouth 3 (three) times daily as needed for up to 7 days for cough., Disp: 21 capsule, Rfl: 0   fluticasone  (FLONASE ) 50 MCG/ACT nasal spray, One spray in each nostril twice daily, Disp: 16 g, Rfl: 6   amLODipine  (NORVASC ) 10 MG tablet, Take 1 tablet (10 mg total) by mouth daily., Disp: 90 tablet, Rfl: 3   aspirin  EC 81 MG tablet, Take 1 tablet (81 mg total) by mouth daily. Swallow whole., Disp: 1 tablet, Rfl: 0   atorvastatin  (LIPITOR) 10 MG tablet, TAKE 1 TABLET BY MOUTH DAILY, Disp: 90 tablet, Rfl: 3   carvedilol  (COREG ) 12.5 MG tablet, Take 1 tablet (12.5 mg total) by mouth 2 (two) times daily with a meal., Disp: 180 tablet, Rfl: 3   citalopram  (CELEXA ) 40 MG tablet, Take 1 tablet (40 mg total) by mouth daily., Disp: 90 tablet, Rfl: 0   dapagliflozin  propanediol (FARXIGA ) 5 MG TABS tablet, Take 1 tablet (5 mg total) by mouth daily before breakfast., Disp: 90 tablet, Rfl: 2   DULoxetine  (CYMBALTA ) 30 MG capsule, Take 30 mg by mouth daily., Disp: , Rfl:    erythromycin ophthalmic ointment, 1 Application., Disp: , Rfl:    furosemide  (LASIX ) 40 MG tablet, Take 1 tablet (40 mg total) by mouth daily., Disp: 90 tablet, Rfl: 3   hydrOXYzine   (VISTARIL ) 25 MG capsule, Take 1 capsule (25 mg total) by mouth every 8 (eight) hours as needed., Disp: 15 capsule, Rfl: 0   ketorolac  (ACULAR ) 0.5 % ophthalmic solution, SMARTSIG:In Eye(s), Disp: , Rfl:    lisinopril  (ZESTRIL ) 40 MG tablet, Take 1 tablet (40 mg total) by mouth daily., Disp: 90 tablet, Rfl: 3   metoprolol  tartrate (LOPRESSOR ) 100 MG tablet, Take 100 mg by mouth., Disp: , Rfl:    MIEBO 1.338 GM/ML SOLN, , Disp: , Rfl:    Na Sulfate-K Sulfate-Mg Sulfate concentrate (SUPREP) 17.5-3.13-1.6 GM/177ML SOLN, Take as directed., Disp: 354 mL, Rfl: 0   omeprazole  (PRILOSEC) 20 MG capsule, Take 1 capsule (20 mg total) by mouth daily., Disp: 90 capsule, Rfl: 3   prednisoLONE  acetate (PRED FORTE ) 1 % ophthalmic suspension, , Disp: , Rfl:    tadalafil  (CIALIS ) 20 MG tablet, TAKE ONE-HALF TO ONE TABLET BY MOUTH ONE TIME EVERY OTHER DAY AS NEEDED FOR ERECTILE DYSFUNCTION, Disp: 10 tablet, Rfl: 11   tirzepatide  (MOUNJARO ) 10 MG/0.5ML Pen, Inject 10 mg into the skin once a week., Disp: 6 mL, Rfl: 3   traMADol  (ULTRAM ) 50 MG tablet, Take 1 tablet (50 mg total) by mouth every 6 (six) hours as needed., Disp: 30 tablet, Rfl: 0   Medications ordered in this encounter:  Meds ordered this encounter  Medications   fluticasone  (FLONASE ) 50 MCG/ACT nasal spray    Sig: One spray in each  nostril twice daily    Dispense:  16 g    Refill:  6    Supervising Provider:   LAMPTEY, PHILIP O [[id number]]   benzonatate  (TESSALON ) 200 MG capsule    Sig: Take 1 capsule (200 mg total) by mouth 3 (three) times daily as needed for up to 7 days for cough.    Dispense:  21 capsule    Refill:  0    Supervising Provider:   BLAISE ALEENE KIDD [[id number]]     *If you need refills on other medications prior to your next appointment, please contact your pharmacy*  Follow-Up: Call back or seek an in-person evaluation if the symptoms worsen or if the condition fails to improve as anticipated.  [facility] [phone]  Other Instructions Get rest and adequate sleep  Drink plenty of water, broth, and other clear fluids to stay hydrated.  Use a cool-mist humidifier or take steamy showers to relieve congestion.  Elevate the head of the bed to help with post nasal drainage Sip warm liquids, gargle with salt water, use lozenges, or suck on hard candy.  Use over-the-counter medications like acetaminophen  (Tylenol ) or ibuprofen (Advil, Motrin) as needed for fever and pain Honey cough drops can help alleviate cough symptoms.  Use saline nasal sprays or washes.  Over the counter mucinex, max strength ( blue and white box) to help loosen sinus congestion.  Please to the emergency room if any new or worsening symptoms  Please seek an in-person evaluation if the symptoms worsen or if the condition fails to improve as anticipated.  PCP follow-up in 5-7 days    If you have been instructed to have an in-person evaluation today at a local Urgent Care facility, please use the link below. It will take you to a list of all of our available [facility], including address, phone number and hours of operation. Please do not delay care.  [facility]  If you or a family member do not have a primary care provider, use the link below to schedule a visit and establish care. When you choose a [facility] primary care physician or advanced practice provider, you gain a long-term partner in health. Find a Primary Care Provider  Learn more about [facility]'s in-office and virtual care options: [facility] - Get Care Now  "

## 2024-02-27 NOTE — Progress Notes (Signed)
 " Virtual Visit Consent   James Camacho, you are scheduled for a virtual visit with a [facility] provider today. Just as with appointments in the office, your consent must be obtained to participate. Your consent will be active for this visit and any virtual visit you may have with one of our providers in the next 365 days. If you have a MyChart account, a copy of this consent can be sent to you electronically.  As this is a virtual visit, video technology does not allow for your provider to perform a traditional examination. This may limit your provider's ability to fully assess your condition. If your provider identifies any concerns that need to be evaluated in person or the need to arrange testing (such as labs, EKG, etc.), we will make arrangements to do so. Although advances in technology are sophisticated, we cannot ensure that it will always work on either your end or our end. If the connection with a video visit is poor, the visit may have to be switched to a telephone visit. With either a video or telephone visit, we are not always able to ensure that we have a secure connection.  By engaging in this virtual visit, you consent to the provision of healthcare and authorize for your insurance to be billed (if applicable) for the services provided during this visit. Depending on your insurance coverage, you may receive a charge related to this service.  I need to obtain your verbal consent now. Are you willing to proceed with your visit today? James Camacho has provided verbal consent on 02/27/2024 for a virtual visit (video or telephone). James Sabine, PA-C  Date: 02/27/2024 1:42 PM   Virtual Visit via Video Note   I, James Camacho, connected with  James Camacho  ([id number], Mar 25, 1950) on 02/27/2024 at  1:30 PM EST by a video-enabled telemedicine application and verified that I am speaking with the correct person using two identifiers.  Location: Patient: Virtual Visit Location  Patient: Home Provider: Virtual Visit Location Provider: Home Office   I discussed the limitations of evaluation and management by telemedicine and the availability of in person appointments. The patient expressed understanding and agreed to proceed.    History of Present Illness: James Camacho is a 73 y.o. who identifies as a male who was assigned male at birth, and is being seen today for sinus congestion.  HPI: Patient presents for evaluation of sinus congestion for the last 3 days.  Associated with runny nose, mild intermittent cough, sore throat.  He also reports burning sensation in ears.  He also reports pressure in his head.  Patient states he has been around people at work who are positive for flu.  He denies any fever and bodyaches at this time.     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  Resting comfortably  at home.  Head is normocephalic, atraumatic.  No labored breathing.  Speech is  clear and coherent with logical content.  Patient is alert and oriented at baseline.    Assessment and Plan: 1. Acute URI (Primary) - fluticasone  (FLONASE ) 50 MCG/ACT nasal spray; One spray in each nostril twice daily  Dispense: 16 g; Refill: 6 - benzonatate  (TESSALON ) 200 MG capsule; Take 1 capsule (200 mg total) by mouth 3  (three) times daily as needed for up to 7 days for cough.  Dispense: 21 capsule; Refill: 0  Based on symptoms reported and duration of symptoms today likely viral uri.  Supportive treatment recommended at this time Patient was counseled extensively about use of antibiotics for viral illness. Patient advised to send a MyChart message and/or do e-visit if symptoms do not resolve in the next 3 to 5 days with advised treatment.  Patient was also advised to do an at-home COVID and flu test.   Follow Up Instructions: I discussed the assessment and treatment plan with the patient. The patient was provided an opportunity to ask questions and all were answered. The patient agreed with the plan and demonstrated an understanding of the instructions.  A copy of instructions were sent to the patient via MyChart unless otherwise noted below.    The patient was advised to call back or seek an in-person evaluation if the symptoms worsen or if the condition fails to improve as anticipated.    James Sudol, PA-C    [1]  Allergies Allergen Reactions   Aleve [Naproxen Sodium] Hives   Ibuprofen Hives   Shrimp (Diagnostic) Other (See Comments)    Itching and dry mouth   Shrimp Extract Other (See Comments)    Itching and dry mouth   Other     Pt can only take Tylenol  for pain- anything prescribed that he has tried breaks him out in a rash.    Penicillin G Rash   Penicillins Rash  [2]  Current Outpatient Medications:    benzonatate  (TESSALON ) 200 MG capsule, Take 1 capsule (200 mg total) by mouth 3 (three) times daily as needed for up to 7 days for cough., Disp: 21 capsule, Rfl: 0   fluticasone  (FLONASE ) 50 MCG/ACT nasal spray, One spray in each nostril twice daily, Disp: 16 g, Rfl: 6   amLODipine  (NORVASC ) 10 MG tablet, Take 1 tablet (10 mg total) by mouth daily., Disp: 90 tablet, Rfl: 3   aspirin  EC 81 MG tablet, Take 1 tablet (81 mg total) by mouth daily. Swallow whole., Disp: 1 tablet, Rfl: 0    atorvastatin  (LIPITOR) 10 MG tablet, TAKE 1 TABLET BY MOUTH DAILY, Disp: 90 tablet, Rfl: 3   carvedilol  (COREG ) 12.5 MG tablet, Take 1 tablet (12.5 mg total) by mouth 2 (two) times daily with a meal., Disp: 180 tablet, Rfl: 3   citalopram  (CELEXA ) 40 MG tablet, Take 1 tablet (40 mg total) by mouth daily., Disp: 90 tablet, Rfl: 0   dapagliflozin  propanediol (FARXIGA ) 5 MG TABS tablet, Take 1 tablet (5 mg total) by mouth daily before breakfast., Disp: 90 tablet, Rfl: 2   DULoxetine  (CYMBALTA ) 30 MG capsule, Take 30 mg by mouth daily., Disp: , Rfl:    erythromycin ophthalmic ointment, 1 Application., Disp: , Rfl:    furosemide  (LASIX ) 40 MG tablet, Take 1 tablet (40 mg total) by mouth daily., Disp: 90 tablet, Rfl: 3   hydrOXYzine  (VISTARIL ) 25 MG capsule, Take 1 capsule (25 mg total) by mouth every 8 (eight) hours as needed., Disp: 15 capsule, Rfl: 0   ketorolac  (ACULAR ) 0.5 % ophthalmic solution, SMARTSIG:In  Eye(s), Disp: , Rfl:    lisinopril  (ZESTRIL ) 40 MG tablet, Take 1 tablet (40 mg total) by mouth daily., Disp: 90 tablet, Rfl: 3   metoprolol  tartrate (LOPRESSOR ) 100 MG tablet, Take 100 mg by mouth., Disp: , Rfl:    MIEBO 1.338 GM/ML SOLN, , Disp: , Rfl:    Na Sulfate-K Sulfate-Mg Sulfate concentrate (SUPREP) 17.5-3.13-1.6 GM/177ML SOLN, Take as directed., Disp: 354 mL, Rfl: 0   omeprazole  (PRILOSEC) 20 MG capsule, Take 1 capsule (20 mg total) by mouth daily., Disp: 90 capsule, Rfl: 3   prednisoLONE  acetate (PRED FORTE ) 1 % ophthalmic suspension, , Disp: , Rfl:    tadalafil  (CIALIS ) 20 MG tablet, TAKE ONE-HALF TO ONE TABLET BY MOUTH ONE TIME EVERY OTHER DAY AS NEEDED FOR ERECTILE DYSFUNCTION, Disp: 10 tablet, Rfl: 11   tirzepatide  (MOUNJARO ) 10 MG/0.5ML Pen, Inject 10 mg into the skin once a week., Disp: 6 mL, Rfl: 3   traMADol  (ULTRAM ) 50 MG tablet, Take 1 tablet (50 mg total) by mouth every 6 (six) hours as needed., Disp: 30 tablet, Rfl: 0  "

## 2024-03-29 ENCOUNTER — Other Ambulatory Visit: Payer: Self-pay

## 2024-03-29 MED ORDER — LISINOPRIL 40 MG PO TABS: 40.0000 mg | ORAL_TABLET | Freq: Every day | ORAL | 0 refills | Status: AC

## 2024-03-29 NOTE — Telephone Encounter (Signed)
 SABRA

## 2024-04-01 ENCOUNTER — Telehealth: Admitting: Nurse Practitioner

## 2024-04-01 DIAGNOSIS — L2089 Other atopic dermatitis: Secondary | ICD-10-CM | POA: Diagnosis not present

## 2024-04-01 MED ORDER — TRIAMCINOLONE ACETONIDE 0.1 % EX CREA: 1.0000 | TOPICAL_CREAM | Freq: Two times a day (BID) | CUTANEOUS | 0 refills | Status: AC

## 2024-04-01 NOTE — Patient Instructions (Addendum)
 " [name], thank you for joining Hadassah Fireman, NP for today's virtual visit.  While this provider is not your primary care provider (PCP), if your PCP is located in our provider database this encounter information will be shared with them immediately following your visit.   A [facility] MyChart account gives you access to today's visit and all your visits, tests, and labs performed at [facility]  click here if you don't have a [facility] MyChart account or go to [email or web address]  Consent: (Patient) James Camacho provided verbal consent for this virtual visit at the beginning of the encounter.  Current Medications:  Current Outpatient Medications:    triamcinolone  cream (KENALOG ) 0.1 %, Apply 1 Application topically 2 (two) times daily for 15 days., Disp: 30 g, Rfl: 0   amLODipine  (NORVASC ) 10 MG tablet, Take 1 tablet (10 mg total) by mouth daily., Disp: 90 tablet, Rfl: 3   aspirin  EC 81 MG tablet, Take 1 tablet (81 mg total) by mouth daily. Swallow whole., Disp: 1 tablet, Rfl: 0   atorvastatin  (LIPITOR) 10 MG tablet, TAKE 1 TABLET BY MOUTH DAILY, Disp: 90 tablet, Rfl: 3   carvedilol  (COREG ) 12.5 MG tablet, Take 1 tablet (12.5 mg total) by mouth 2 (two) times daily with a meal., Disp: 180 tablet, Rfl: 3   citalopram  (CELEXA ) 40 MG tablet, Take 1 tablet (40 mg total) by mouth daily., Disp: 90 tablet, Rfl: 0   dapagliflozin  propanediol (FARXIGA ) 5 MG TABS tablet, Take 1 tablet (5 mg total) by mouth daily before breakfast., Disp: 90 tablet, Rfl: 2   DULoxetine  (CYMBALTA ) 30 MG capsule, Take 30 mg by mouth daily., Disp: , Rfl:    erythromycin ophthalmic ointment, 1 Application., Disp: , Rfl:    fluticasone  (FLONASE ) 50 MCG/ACT nasal spray, One spray in each nostril twice daily, Disp: 16 g, Rfl: 6   furosemide  (LASIX ) 40 MG tablet, Take 1 tablet (40 mg total) by mouth daily., Disp: 90 tablet, Rfl: 3   hydrOXYzine  (VISTARIL ) 25 MG capsule, Take 1 capsule (25 mg  total) by mouth every 8 (eight) hours as needed., Disp: 15 capsule, Rfl: 0   ketorolac  (ACULAR ) 0.5 % ophthalmic solution, SMARTSIG:In Eye(s), Disp: , Rfl:    lisinopril  (ZESTRIL ) 40 MG tablet, Take 1 tablet (40 mg total) by mouth daily., Disp: 90 tablet, Rfl: 0   metoprolol  tartrate (LOPRESSOR ) 100 MG tablet, Take 100 mg by mouth., Disp: , Rfl:    MIEBO 1.338 GM/ML SOLN, , Disp: , Rfl:    Na Sulfate-K Sulfate-Mg Sulfate concentrate (SUPREP) 17.5-3.13-1.6 GM/177ML SOLN, Take as directed., Disp: 354 mL, Rfl: 0   omeprazole  (PRILOSEC) 20 MG capsule, Take 1 capsule (20 mg total) by mouth daily., Disp: 90 capsule, Rfl: 3   prednisoLONE  acetate (PRED FORTE ) 1 % ophthalmic suspension, , Disp: , Rfl:    tadalafil  (CIALIS ) 20 MG tablet, TAKE ONE-HALF TO ONE TABLET BY MOUTH ONE TIME EVERY OTHER DAY AS NEEDED FOR ERECTILE DYSFUNCTION, Disp: 10 tablet, Rfl: 11   tirzepatide  (MOUNJARO ) 10 MG/0.5ML Pen, Inject 10 mg into the skin once a week., Disp: 6 mL, Rfl: 3   traMADol  (ULTRAM ) 50 MG tablet, Take 1 tablet (50 mg total) by mouth every 6 (six) hours as needed., Disp: 30 tablet, Rfl: 0   Medications ordered in this encounter:  Meds ordered this encounter  Medications   triamcinolone  cream (KENALOG ) 0.1 %    Sig: Apply 1 Application topically 2 (two) times daily for 15 days.    Dispense:  30 g    Refill:  0     *If you need refills on other medications prior to your next appointment, please contact your pharmacy*  Follow-Up: Call back or seek an in-person evaluation if the symptoms worsen or if the condition fails to improve as anticipated.  [facility] [phone]  Other Instructions    If you have been instructed to have an in-person evaluation today at a local Urgent Care facility, please use the link below. It will take you to a list of all of our available [facility], including address, phone number and hours of operation. Please do not delay care.  [facility]  If you or a family member do not have a primary care provider, use the link below to schedule a visit and establish care. When you choose a [facility] primary care physician or advanced practice provider, you gain a long-term partner in health. Find a Primary Care Provider  Learn more about [facility]'s in-office and virtual care options: [facility] - Get Care Now  "

## 2024-04-01 NOTE — Progress Notes (Signed)
 " Virtual Visit Consent   James Camacho, you are scheduled for a virtual visit with a [facility] provider today. Just as with appointments in the office, your consent must be obtained to participate. Your consent will be active for this visit and any virtual visit you may have with one of our providers in the next 365 days. If you have a MyChart account, a copy of this consent can be sent to you electronically.  As this is a virtual visit, video technology does not allow for your provider to perform a traditional examination. This may limit your provider's ability to fully assess your condition. If your provider identifies any concerns that need to be evaluated in person or the need to arrange testing (such as labs, EKG, etc.), we will make arrangements to do so. Although advances in technology are sophisticated, we cannot ensure that it will always work on either your end or our end. If the connection with a video visit is poor, the visit may have to be switched to a telephone visit. With either a video or telephone visit, we are not always able to ensure that we have a secure connection.  By engaging in this virtual visit, you consent to the provision of healthcare and authorize for your insurance to be billed (if applicable) for the services provided during this visit. Depending on your insurance coverage, you may receive a charge related to this service.  I need to obtain your verbal consent now. Are you willing to proceed with your visit today? James Camacho has provided verbal consent on 04/01/2024 for a virtual visit (video or telephone). Hadassah Fireman, NP  Date: 04/01/2024 11:28 AM   Virtual Visit via Video Note   I, Hadassah Fireman, connected with  James Camacho  ([id number], 1950/12/26) on 04/01/24 at 11:00 AM EST by a video-enabled telemedicine application and verified that I am speaking with the correct person using two identifiers.  Location: Patient: Virtual Visit Location Patient:  Home Provider: Virtual Visit Location Provider: Home Office   I discussed the limitations of evaluation and management by telemedicine and the availability of in person appointments. The patient expressed understanding and agreed to proceed.    History of Present Illness: James Camacho is a 74 y.o. who identifies as a male who was assigned male at birth, and is being seen today for continued burning, itching rash in the bend of his right elbow x8 days. He was seen by telehealth [facility] on 1/18 for allergic reaction due to suspected contamination of food with shellfish. Has shellfish allergy. Was given Rx prednisone  which he is still taking with some relief and an epipen  for worsening symptoms. Denies chest pain, SOB, fevers/chills, spreading of rash to other locations. Irritation mainly caused when he is wearing sleeves or something is touching the area. Endorses changing detergent around the same time as the initial allergic reaction, but changed back to his previous detergent.   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  Resting comfortably  at home.  Head is normocephalic, atraumatic.  No labored breathing.  Speech is clear and coherent with logical content.  Patient is alert and oriented at baseline.  Erythematous  non-blistering or non-bullous rash noted to right elbow crease. No active exudate or bleeding noted.   Assessment and Plan: 1. Other atopic dermatitis (Primary)  Localized rash to right elbow crease. Could be due to ongoing reaction from suspected food contamination with shellfish with hx of shellfish allergy or due to recent change of detergents.  No concerns for signs and symptoms of infection at this time.  Advised to wash area with warm water, non-irritant soaps, pat dry and apply Rx triamcinolone  0.1% topical cream to affected area 2 times daily as needed.  Please seek further care/in person evaluation if rash does not resolve or worsens  Follow Up Instructions: I discussed the assessment and treatment plan with the patient. The patient was provided an opportunity to ask questions and all were answered. The patient agreed with the plan and demonstrated an understanding of the instructions.  A copy of instructions were sent to the patient via MyChart unless otherwise noted below.    The patient was advised to call back or seek an in-person evaluation if the symptoms worsen or if the condition fails to improve as anticipated.    Hadassah Fireman, NP     [1]  Allergies Allergen Reactions   Aleve [Naproxen Sodium] Hives   Ibuprofen Hives   Shrimp (Diagnostic) Other (See Comments)    Itching and dry mouth   Shrimp Extract Other (See Comments)    Itching and dry mouth   Other     Pt can only take Tylenol  for pain- anything prescribed that he has tried breaks him out in a rash.    Penicillin G Rash   Penicillins Rash  [2]  Current Outpatient Medications:    amLODipine  (NORVASC ) 10 MG tablet, Take 1 tablet (10 mg total) by mouth daily., Disp: 90 tablet, Rfl: 3   aspirin  EC 81 MG tablet, Take 1 tablet (81 mg total) by mouth daily. Swallow whole., Disp: 1 tablet, Rfl: 0   atorvastatin  (LIPITOR) 10 MG tablet, TAKE 1 TABLET BY MOUTH DAILY, Disp: 90 tablet, Rfl: 3   carvedilol  (COREG ) 12.5  MG tablet, Take 1 tablet (12.5 mg total) by mouth 2 (two) times daily with a meal., Disp: 180 tablet, Rfl: 3   citalopram  (CELEXA ) 40 MG tablet, Take 1 tablet (40 mg total) by mouth daily., Disp: 90 tablet, Rfl: 0   dapagliflozin  propanediol (FARXIGA ) 5 MG TABS tablet, Take 1 tablet (5 mg total) by mouth daily before breakfast., Disp: 90 tablet, Rfl: 2   DULoxetine  (CYMBALTA ) 30 MG capsule, Take 30 mg by mouth daily., Disp: , Rfl:    erythromycin ophthalmic ointment, 1 Application., Disp: , Rfl:    fluticasone  (FLONASE ) 50 MCG/ACT nasal spray, One spray in each nostril twice daily, Disp: 16 g, Rfl: 6   furosemide  (LASIX ) 40 MG tablet, Take 1 tablet (40 mg total) by mouth daily., Disp: 90 tablet, Rfl: 3   hydrOXYzine  (VISTARIL ) 25 MG capsule, Take 1 capsule (25 mg total) by mouth every 8 (eight) hours as needed., Disp: 15 capsule, Rfl: 0   ketorolac  (ACULAR ) 0.5 % ophthalmic solution, SMARTSIG:In Eye(s), Disp: , Rfl:    lisinopril  (ZESTRIL ) 40 MG tablet, Take 1 tablet (40 mg total) by mouth daily., Disp: 90 tablet,  Rfl: 0   metoprolol  tartrate (LOPRESSOR ) 100 MG tablet, Take 100 mg by mouth., Disp: , Rfl:    MIEBO 1.338 GM/ML SOLN, , Disp: , Rfl:    Na Sulfate-K Sulfate-Mg Sulfate concentrate (SUPREP) 17.5-3.13-1.6 GM/177ML SOLN, Take as directed., Disp: 354 mL, Rfl: 0   omeprazole  (PRILOSEC) 20 MG capsule, Take 1 capsule (20 mg total) by mouth daily., Disp: 90 capsule, Rfl: 3   prednisoLONE  acetate (PRED FORTE ) 1 % ophthalmic suspension, , Disp: , Rfl:    tadalafil  (CIALIS ) 20 MG tablet, TAKE ONE-HALF TO ONE TABLET BY MOUTH ONE TIME EVERY OTHER DAY AS NEEDED FOR ERECTILE DYSFUNCTION, Disp: 10 tablet, Rfl: 11   tirzepatide  (MOUNJARO ) 10 MG/0.5ML Pen, Inject 10 mg into the skin once a week., Disp: 6 mL, Rfl: 3   traMADol  (ULTRAM ) 50 MG tablet, Take 1 tablet (50 mg total) by mouth every 6 (six) hours as needed., Disp: 30 tablet, Rfl: 0  "

## 2024-04-29 ENCOUNTER — Encounter

## 2024-06-10 ENCOUNTER — Ambulatory Visit: Admitting: Cardiovascular Disease

## 2024-07-17 ENCOUNTER — Encounter
